# Patient Record
Sex: Female | Born: 1950 | Race: Black or African American | Hispanic: No | Smoking: Never smoker
Health system: Southern US, Community
[De-identification: ages and names within clinical notes are randomized; demographics above are authoritative.]

## PROBLEM LIST (undated history)

## (undated) DIAGNOSIS — D649 Anemia, unspecified: Secondary | ICD-10-CM

## (undated) DIAGNOSIS — N182 Chronic kidney disease, stage 2 (mild): Secondary | ICD-10-CM

## (undated) DIAGNOSIS — R51 Headache: Secondary | ICD-10-CM

## (undated) DIAGNOSIS — I10 Essential (primary) hypertension: Secondary | ICD-10-CM

## (undated) DIAGNOSIS — G8929 Other chronic pain: Secondary | ICD-10-CM

## (undated) DIAGNOSIS — M8580 Other specified disorders of bone density and structure, unspecified site: Secondary | ICD-10-CM

## (undated) DIAGNOSIS — K219 Gastro-esophageal reflux disease without esophagitis: Secondary | ICD-10-CM

## (undated) DIAGNOSIS — F419 Anxiety disorder, unspecified: Secondary | ICD-10-CM

## (undated) DIAGNOSIS — F32A Depression, unspecified: Secondary | ICD-10-CM

## (undated) DIAGNOSIS — M549 Dorsalgia, unspecified: Secondary | ICD-10-CM

## (undated) DIAGNOSIS — K5792 Diverticulitis of intestine, part unspecified, without perforation or abscess without bleeding: Secondary | ICD-10-CM

## (undated) DIAGNOSIS — I341 Nonrheumatic mitral (valve) prolapse: Secondary | ICD-10-CM

## (undated) DIAGNOSIS — N289 Disorder of kidney and ureter, unspecified: Secondary | ICD-10-CM

## (undated) DIAGNOSIS — E7800 Pure hypercholesterolemia, unspecified: Secondary | ICD-10-CM

## (undated) DIAGNOSIS — I4891 Unspecified atrial fibrillation: Secondary | ICD-10-CM

## (undated) DIAGNOSIS — M797 Fibromyalgia: Secondary | ICD-10-CM

## (undated) DIAGNOSIS — F329 Major depressive disorder, single episode, unspecified: Secondary | ICD-10-CM

## (undated) DIAGNOSIS — J45909 Unspecified asthma, uncomplicated: Secondary | ICD-10-CM

## (undated) DIAGNOSIS — M1990 Unspecified osteoarthritis, unspecified site: Secondary | ICD-10-CM

## (undated) DIAGNOSIS — J189 Pneumonia, unspecified organism: Secondary | ICD-10-CM

## (undated) DIAGNOSIS — E213 Hyperparathyroidism, unspecified: Secondary | ICD-10-CM

## (undated) DIAGNOSIS — Z8719 Personal history of other diseases of the digestive system: Secondary | ICD-10-CM

## (undated) DIAGNOSIS — R519 Headache, unspecified: Secondary | ICD-10-CM

## (undated) HISTORY — PX: CHOLECYSTECTOMY: SHX55

## (undated) HISTORY — PX: TRANSURETHRAL RESECTION OF BLADDER TUMOR WITH GYRUS (TURBT-GYRUS): SHX6458

## (undated) HISTORY — PX: TONSILLECTOMY AND ADENOIDECTOMY: SUR1326

## (undated) HISTORY — PX: HERNIA REPAIR: SHX51

## (undated) HISTORY — PX: CARDIAC CATHETERIZATION: SHX172

---

## 1970-11-17 HISTORY — PX: TUBAL LIGATION: SHX77

## 1979-07-19 HISTORY — PX: VAGINAL HYSTERECTOMY: SUR661

## 1979-07-19 HISTORY — PX: NISSEN FUNDOPLICATION: SHX2091

## 1989-07-18 HISTORY — PX: LIVER BIOPSY: SHX301

## 1989-07-18 HISTORY — PX: KNEE ARTHROSCOPY: SHX127

## 1999-10-27 ENCOUNTER — Emergency Department (HOSPITAL_COMMUNITY): Admission: EM | Admit: 1999-10-27 | Discharge: 1999-10-27 | Payer: Self-pay | Admitting: Emergency Medicine

## 1999-12-22 ENCOUNTER — Emergency Department (HOSPITAL_COMMUNITY): Admission: EM | Admit: 1999-12-22 | Discharge: 1999-12-22 | Payer: Self-pay | Admitting: Emergency Medicine

## 1999-12-22 ENCOUNTER — Encounter: Payer: Self-pay | Admitting: Internal Medicine

## 2000-09-20 ENCOUNTER — Encounter: Payer: Self-pay | Admitting: Emergency Medicine

## 2000-09-20 ENCOUNTER — Emergency Department (HOSPITAL_COMMUNITY): Admission: EM | Admit: 2000-09-20 | Discharge: 2000-09-20 | Payer: Self-pay | Admitting: Emergency Medicine

## 2001-08-23 ENCOUNTER — Encounter: Payer: Self-pay | Admitting: Emergency Medicine

## 2001-08-23 ENCOUNTER — Emergency Department (HOSPITAL_COMMUNITY): Admission: EM | Admit: 2001-08-23 | Discharge: 2001-08-23 | Payer: Self-pay | Admitting: *Deleted

## 2001-10-01 ENCOUNTER — Encounter: Payer: Self-pay | Admitting: Gastroenterology

## 2001-10-01 ENCOUNTER — Encounter: Admission: RE | Admit: 2001-10-01 | Discharge: 2001-10-01 | Payer: Self-pay | Admitting: Gastroenterology

## 2002-02-22 ENCOUNTER — Ambulatory Visit (HOSPITAL_COMMUNITY): Admission: RE | Admit: 2002-02-22 | Discharge: 2002-02-22 | Payer: Self-pay | Admitting: Gastroenterology

## 2002-02-22 ENCOUNTER — Encounter: Payer: Self-pay | Admitting: Gastroenterology

## 2002-03-02 ENCOUNTER — Encounter (INDEPENDENT_AMBULATORY_CARE_PROVIDER_SITE_OTHER): Payer: Self-pay | Admitting: Specialist

## 2002-03-02 ENCOUNTER — Ambulatory Visit (HOSPITAL_COMMUNITY): Admission: RE | Admit: 2002-03-02 | Discharge: 2002-03-02 | Payer: Self-pay | Admitting: Gastroenterology

## 2002-03-14 ENCOUNTER — Encounter: Payer: Self-pay | Admitting: General Surgery

## 2002-03-14 ENCOUNTER — Encounter (INDEPENDENT_AMBULATORY_CARE_PROVIDER_SITE_OTHER): Payer: Self-pay | Admitting: Specialist

## 2002-03-14 ENCOUNTER — Ambulatory Visit (HOSPITAL_COMMUNITY): Admission: RE | Admit: 2002-03-14 | Discharge: 2002-03-15 | Payer: Self-pay | Admitting: General Surgery

## 2002-03-22 ENCOUNTER — Ambulatory Visit (HOSPITAL_COMMUNITY): Admission: RE | Admit: 2002-03-22 | Discharge: 2002-03-22 | Payer: Self-pay | Admitting: General Surgery

## 2002-03-22 ENCOUNTER — Encounter: Payer: Self-pay | Admitting: General Surgery

## 2002-10-20 ENCOUNTER — Encounter: Admission: RE | Admit: 2002-10-20 | Discharge: 2002-10-20 | Payer: Self-pay | Admitting: Internal Medicine

## 2002-10-20 ENCOUNTER — Encounter: Payer: Self-pay | Admitting: Internal Medicine

## 2002-10-25 ENCOUNTER — Inpatient Hospital Stay (HOSPITAL_COMMUNITY): Admission: RE | Admit: 2002-10-25 | Discharge: 2002-10-26 | Payer: Self-pay | Admitting: Internal Medicine

## 2002-10-25 ENCOUNTER — Encounter: Payer: Self-pay | Admitting: Internal Medicine

## 2002-11-07 ENCOUNTER — Encounter: Admission: RE | Admit: 2002-11-07 | Discharge: 2002-11-07 | Payer: Self-pay | Admitting: Internal Medicine

## 2002-11-07 ENCOUNTER — Encounter: Payer: Self-pay | Admitting: Internal Medicine

## 2003-11-24 ENCOUNTER — Encounter: Admission: RE | Admit: 2003-11-24 | Discharge: 2003-11-24 | Payer: Self-pay | Admitting: Internal Medicine

## 2006-03-31 ENCOUNTER — Emergency Department (HOSPITAL_COMMUNITY): Admission: EM | Admit: 2006-03-31 | Discharge: 2006-04-01 | Payer: Self-pay | Admitting: Emergency Medicine

## 2008-08-07 ENCOUNTER — Emergency Department (HOSPITAL_BASED_OUTPATIENT_CLINIC_OR_DEPARTMENT_OTHER): Admission: EM | Admit: 2008-08-07 | Discharge: 2008-08-07 | Payer: Self-pay | Admitting: Emergency Medicine

## 2009-12-28 ENCOUNTER — Ambulatory Visit (HOSPITAL_COMMUNITY): Admission: RE | Admit: 2009-12-28 | Discharge: 2009-12-28 | Payer: Self-pay | Admitting: Cardiovascular Disease

## 2010-03-08 ENCOUNTER — Ambulatory Visit: Payer: Self-pay | Admitting: Diagnostic Radiology

## 2010-03-08 ENCOUNTER — Emergency Department (HOSPITAL_BASED_OUTPATIENT_CLINIC_OR_DEPARTMENT_OTHER): Admission: EM | Admit: 2010-03-08 | Discharge: 2010-03-09 | Payer: Self-pay | Admitting: Emergency Medicine

## 2010-06-04 ENCOUNTER — Encounter: Admission: RE | Admit: 2010-06-04 | Discharge: 2010-06-04 | Payer: Self-pay | Admitting: Unknown Physician Specialty

## 2011-02-04 LAB — DIFFERENTIAL
Basophils Absolute: 0 10*3/uL (ref 0.0–0.1)
Basophils Relative: 1 % (ref 0–1)
Eosinophils Absolute: 0.1 10*3/uL (ref 0.0–0.7)
Eosinophils Relative: 2 % (ref 0–5)
Lymphocytes Relative: 52 % — ABNORMAL HIGH (ref 12–46)
Lymphs Abs: 2.6 10*3/uL (ref 0.7–4.0)
Monocytes Absolute: 0.3 10*3/uL (ref 0.1–1.0)
Neutro Abs: 1.8 10*3/uL (ref 1.7–7.7)

## 2011-02-04 LAB — URINALYSIS, ROUTINE W REFLEX MICROSCOPIC
Ketones, ur: NEGATIVE mg/dL
Specific Gravity, Urine: 1.013 (ref 1.005–1.030)
pH: 6.5 (ref 5.0–8.0)

## 2011-02-04 LAB — BASIC METABOLIC PANEL
BUN: 11 mg/dL (ref 6–23)
CO2: 32 mEq/L (ref 19–32)
Calcium: 10 mg/dL (ref 8.4–10.5)
Chloride: 99 mEq/L (ref 96–112)
Creatinine, Ser: 0.8 mg/dL (ref 0.4–1.2)
GFR calc non Af Amer: >60 mL/min (ref >60)
Glucose, Bld: 94 mg/dL (ref 70–99)
Potassium: 2.8 mEq/L — ABNORMAL LOW (ref 3.5–5.1)

## 2011-02-04 LAB — CBC
MCHC: 32.7 g/dL (ref 30.0–36.0)
MCV: 86.7 fL (ref 78.0–100.0)
Platelets: 306 10*3/uL (ref 150–400)
RBC: 4.13 MIL/uL (ref 3.87–5.11)
RDW: 14.3 % (ref 11.5–15.5)
WBC: 4.8 10*3/uL (ref 4.0–10.5)

## 2011-02-04 LAB — POCT CARDIAC MARKERS: CKMB, poc: <1 ng/mL — ABNORMAL LOW (ref 1.0–8.0)

## 2011-02-07 LAB — CBC
HCT: 31.5 % — ABNORMAL LOW (ref 36.0–46.0)
Hemoglobin: 10.9 g/dL — ABNORMAL LOW (ref 12.0–15.0)
MCHC: 34.6 g/dL (ref 30.0–36.0)
MCV: 86.3 fL (ref 78.0–100.0)
Platelets: 273 10*3/uL (ref 150–400)
RBC: 3.65 MIL/uL — ABNORMAL LOW (ref 3.87–5.11)
RDW: 14.9 % (ref 11.5–15.5)
WBC: 4.1 10*3/uL (ref 4.0–10.5)

## 2011-02-07 LAB — PROTIME-INR
INR: 1 (ref 0.00–1.49)
Prothrombin Time: 13.1 seconds (ref 11.6–15.2)

## 2011-02-07 LAB — BASIC METABOLIC PANEL
CO2: 29 mEq/L (ref 19–32)
Calcium: 9.7 mg/dL (ref 8.4–10.5)
GFR calc Af Amer: >60 mL/min (ref >60)
GFR calc non Af Amer: >60 mL/min (ref >60)
Potassium: 3.9 mEq/L (ref 3.5–5.1)
Sodium: 137 mEq/L (ref 135–145)

## 2011-02-07 LAB — URINALYSIS, ROUTINE W REFLEX MICROSCOPIC
Glucose, UA: NEGATIVE mg/dL
Nitrite: NEGATIVE
pH: 8 (ref 5.0–8.0)

## 2011-02-07 LAB — APTT: aPTT: 31 seconds (ref 24–37)

## 2011-04-04 NOTE — Procedures (Signed)
[place]. [facility]  Patient:    Christina Vang, Christina Vang Visit Number: [id number] MRN: [id number]          Service Type: END Location: ENDO Attending Physician:  Charna Elizabeth Dictated by:   Anselmo Rod, M.D. Proc. Date: 03/02/02 Admit Date:  03/02/2002   CC:         Cala Bradford R. Renae Gloss, M.D.  Adolph Pollack, M.D.   Procedure Report  DATE OF BIRTH:  1950/11/29.  PROCEDURE:  Colonoscopy.  ENDOSCOPIST:  Anselmo Rod, M.D.  INSTRUMENT USED:  Olympus video colonoscope (adjustable pediatric scope).  INDICATION FOR PROCEDURE:  Guaiac-positive stools and severe constipation in a 60 year old African-American female.  Rule out colonic polyps, masses, hemorrhoids, etc.  PREPROCEDURE PREPARATION:  Informed consent was procured from the patient. The patient was fasted for eight hours prior to the procedure and prepped with a bottle of magnesium citrate and a gallon of NuLytely the night prior to the procedure.  PREPROCEDURE PHYSICAL:  VITAL SIGNS:  The patient had stable vital signs.  NECK:  Supple.  CHEST:  Clear to auscultation.  S1, S2 regular.  ABDOMEN:  Soft with normal bowel sounds.  DESCRIPTION OF PROCEDURE:  The patient was placed in the left lateral decubitus position and sedated with an additional 20 mg of Demerol and 2 mg of Versed intravenously for the colonoscopy.  Once the patient was adequately sedate and maintained on low-flow oxygen and continuous cardiac monitoring, the Olympus video colonoscope was advanced from the rectum to the cecum with extreme difficulty.  There was a large amount of residual stool in the colon. Multiple washes were done but because of solid debris, visualization was not adequate.  The patients position was changed from the left lateral to the supine position to adequately visualize the cecal base.  The appendiceal orifice and the ileocecal valve were identified but because of the stool, small  lesions could have been missed.  There were a few scattered diverticula throughout the colon.  No masses or polyps were seen.  IMPRESSION: 1. Scattered diverticular disease. 2. Large amount of residual stool in the colon, small lesions could have been    missed. 3. No large masses or polyps present. 4. No evidence of hemorrhoidal disease.  RECOMMENDATIONS: 1. Repeat guaiac testing will be done on an outpatient basis. 2. Patient will be tried on Zelnorm 6 mg b.i.d. along with a high-fiber diet    and further recommendations made in follow-up. Dictated by:   Anselmo Rod, M.D. Attending Physician:  Charna Elizabeth DD:  03/02/02 TD:  03/02/02 Job: [id number]

## 2011-04-04 NOTE — Op Note (Signed)
[place]. [facility]  Patient:    Christina Vang, Christina Vang Visit Number: [id number] MRN: [id number]          Service Type: DSU Location: [phone] Attending Physician:  Arlis Porta Dictated by:   Adolph Pollack, M.D. Proc. Date: 03/14/02 Admit Date:  03/14/2002   CC:         Anselmo Rod, M.D.   Operative Report  PREOPERATIVE DIAGNOSIS: 1. Biliary dyskinesia. 2. Abnormal liver function tests.  POSTOPERATIVE DIAGNOSIS: 1. Biliary dyskinesia. 2. Abnormal liver function tests.  OPERATION PERFORMED: 1. Laparoscopic cholecystectomy with intraoperative cholangiogram. 2. Tru-Cut needle liver biopsy.  SURGEON:  Adolph Pollack, M.D.  ASSISTANT:  Lorne Skeens. Hoxworth, M.D.  ANESTHESIA:  General.  INDICATIONS FOR PROCEDURE:  The patient is a 60 year old female who has been having a gnawing type of epigastric pain radiating to the back associated with nausea.  She has known reflux but this is different.  She has a normal gallbladder ultrasound but has an abnormal hepatobiliary scan with ejection fraction that is depressed consistent with biliary dyskinesia.  She also had abnormal liver function tests, etiology unknown.  She presents now for elective operation.  The procedure and the risks were discussed with her. With respect to the liver biopsy, her gastroenterologist called me after the office visit and requested this be done for the abnormal liver function tests and I explained this to Christina Vang in the holding area.  She was agreeable to it.  DESCRIPTION OF PROCEDURE:  She was placed supine on the operating table and a general anesthetic was administered.  The abdomen was sterilely prepped and draped.  She had had a previous upper midline incision for open Nissen fundoplication.  I injected local anesthetic in the subumbilical region and made a subumbilical incision incising the skin and subcutaneous tissue sharply.  I then  identified the midline fascia and made a small incision in the midline fascia.  A pursestring suture of 0 Vicryl was placed around the fascial edges.  The peritoneal cavity was then entered bluntly and under direct vision.  A Hasson trocar was introduced into the peritoneal cavity and a pneumoperitoneum was created by insufflation of CO2 gas.  Next, a laparoscope was introduced.  The right upper quadrant area of the abdominal cavity was relatively free from adhesions.  I was able to put an 11 mm trocar through a similar sized incision in the epigastrium and two 5 mm trocars in the right upper quadrant.  The fundus of the gallbladder was grasped and filmy adhesions between the body of the gallbladder, duodenum and omentum were taken down bluntly and sharply.  The infundibulum was then completely mobilized.  The cystic duct was isolated and a window created around it.  The cystic artery was identified and a window created around it. I then placed a clip at the cystic duct and gallbladder junction.  A small incision incision was made just proximal to this clip and I passed a cholangiocatheter through the anterior abdominal wall and attempted to cannulate the cystic duct but was unsuccessful.  I made an incision more proximal to this and was able to cannulate the cystic duct with a cholangiocatheter to perform a cholangiogram.  Under real time fluoroscopy contrast material was injected through the catheter into the cystic duct.  The common bile duct was opacified quickly with quick passage of contrast material into the duodenum and no obvious evidence to my eye of obstruction.  I also saw  the common hepatic duct and right and left hepatic ducts under real time fluoroscopy.  Still photos were taken and sent to the radiologist for official interpretation which is pending at this time.  Next, I removed the cholangiocatheter, I clipped the cystic duct three times proximally and divided it. The  cystic artery was clipped and divided.  I dissected the gallbladder free from the liver bed with the cautery.  A small puncture wound was made in the gallbladder with some spillage of bile of a moderate amount.  I removed the gallbladder and placed it into an endopouch bag.  I then copiously irrigated out the gallbladder fossa and bleeding points were controlled with the cautery.  Next, I passed a Tru-Cut needle through the anterior abdominal wall into the liver and a Tru-Cut needle biopsy was performed obtaining an adequate core of tissue.  This was sent to pathology. The biopsy site bleeding was controlled with the cautery.  I inspected the gallbladder fossa once again and saw no bile leakage or bleeding and then inspected the liver biopsy site and saw no bleeding.  I then copiously irrigated out the perihepatic area and the fluid rapidly turned clear.  Under direct vision I removed the gallbladder in the Endopouch bag through the subumbilical port and closed the subumbilical fascia defect by tightening up and tying down the pursestring suture.  The rest of the trocars were removed and the pneumoperitoneum was released.  Skin incisions were closed with 4-0 Monocryl subcuticular stitches. Steri-Strips and sterile dressings were applied.  The patient tolerated the procedure well without any apparent complications and was taken to the recovery room in satisfactory condition.  She does have a history of some mitral valve prolapse.  However, I felt that a risk of anything given the type of operation was small and we just were covering her with the perioperative Ancef.Dictated by:   Adolph Pollack, M.D. Attending Physician:  Arlis Porta DD:  03/14/02 TD:  03/14/02 Job: [id number]

## 2011-04-04 NOTE — H&P (Signed)
NAME:  Christina Vang, Christina Vang                      ACCOUNT NO.:  [id number]   MEDICAL RECORD NO.:  [id number]                   PATIENT TYPE:  INP   LOCATION:  2923                                 FACILITY:  [facility]   PHYSICIAN:  Ralene Ok, M.D.                   DATE OF BIRTH:  [birth year]   DATE OF ADMISSION:  10/25/2002  DATE OF DISCHARGE:                                HISTORY & PHYSICAL   PRESENTING COMPLAINT:  Chest pain.   HISTORY OF PRESENT ILLNESS:  This 60 year old African-American lady  presented to the office with a history of two hours duration chest pain. The  chest pain was sudden onset started at rest and did radiate up to her neck  and was basically retrosternal, but also radiated across the upper abdomen.  The patient also admitted to pain increase on deep inspiration.   The patient recently was treated for an acute bronchitis and also had some  chest pains in the recent past. The patient says this had been going on for  the past two months. A recent stress test done at [facility]  called a stress dual-isotope SPECT study was reported to show normal  perfusion and redistribution images.  The patient had been ambulating and  exercising and admits to taking her medication on a regular basis. She had  been a little more depressed and under stress recently.   PAST MEDICAL HISTORY:  The patient also has a past history of hypertension,  hyperlipidemia, fibromyalgia, hiatal hernia, status post fundoplication,  history of diverticulosis, known fatty liver with proven liver biopsy,  status post cholecystectomy, status post hysterectomy. 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 She is up to date on her Pneumovax and flu vaccine. The patient  has had a colonoscopy in the past which was normal.   SOCIAL HISTORY:  The patient is a nonsmoker. She does not drink any alcohol.   ALLERGIES:   TETRACYCLINE.   REVIEW OF SYSTEMS:  The patient says her cough is improved from a recent  bronchitis. There is no shortness of breath today. Her bowels and  micturition were normal. The patient did not have any dizzy spells and no  headaches. Her myalgia pains have improved.   MEDICATIONS:  1. Prevacid 30 mg q.d.  2. Lexapro 10 mg q.d.  3. Flagyl 20 mg b.i.d.  4. Trazodone 50 mg 2 q.h.s.  5. Hydrochlorothiazide 12.5 mg q.d.  6. Benicar 20/12.5 mg q.d.  7. Flexeril 10 mg b.i.d.  8. Zetia 10 mg q.d.  9. Toprol  XL 200 mg q.d.  10.      Multivitamin plus vitamin C and magnesium plus E 1 q.d.  11.      Tylenol p.r.n.   PHYSICAL EXAMINATION:  GENERAL:  The patient was  anxious and appeared to be  in slight  distress.  VITAL SIGNS:  Heart rate 70 per minute, blood pressure was elevated at  160/100.  HEENT:  Appeared normal. Conjunctivae were normal. There was no icterus.  NECK:  Supple, JVP was normal. There were no bruits.  CHEST:  Clinically clear to auscultation. There were no murmurs noted.  ABDOMEN:  Showed tenderness in the upper abdomen which extended to the  medial chest wall. Liver and spleen were not palpable. Bowel sounds were  within normal limits.  NEUROLOGIC:  There was no localizing neurological deficit, and the patient's  gait was normal.   LABORATORY DATA:  An EKG done in the office showed no evidence of acute ST  changes.   PROBLEMS:  1. Atypical chest pain. Her risk factors were coronary artery disease and     hypertension and hyperlipidemia. In spite of recent negative stress test     two months ago, the patient will be admitted to rule out acute cardiac     event.  2. Chest wall tenderness and abdominal tenderness. Suggest a GI cause and     could be related to the patient's previous hiatal hernia.  3. Anxiety. May be another factor contributing to  the patient's problem.  4. Hypertension with elevated blood pressures. Will monitor this in the      hospital.  5. Bronchitis. Appears to have resolved.  6. Known fibromyalgia. Will continue the patient's current medication in     Lexapro and Trazodone.  7. Fatty liver with previous abnormal liver function tests.  8. Arthritis of the knees.   PLAN:  The patient will be admitted to the CCU for cardiac monitoring. Will  repeat an EKG on arrival. Will check CK-MB and troponin x 3. Place the  patient on nitro paste, continue her on beta blockers and ARBs and add  aspirin once daily and review with the patient once these results are back  to see if the patient needs further cardiac evaluation.                                                   Ralene Ok, M.D.    RM/MEDQ  D:  10/25/2002  T:  10/25/2002  Job:  [id number]

## 2011-04-04 NOTE — Procedures (Signed)
[place]. [facility]  Patient:    Christina Vang, Christina Vang Visit Number: [id number] MRN: [id number]          Service Type: END Location: ENDO Attending Physician:  Charna Elizabeth Dictated by:   Anselmo Rod, M.D. Proc. Date: 03/02/02 Admit Date:  03/02/2002   CC:         Cala Bradford R. Renae Gloss, M.D.  Adolph Pollack, M.D.   Procedure Report  DATE OF BIRTH:  1951/10/03.  PROCEDURE:  Esophagogastroduodenoscopy with biopsies.  ENDOSCOPIST:  Anselmo Rod, M.D.  INSTRUMENT USED:  Olympus video panendoscope.  INDICATION FOR PROCEDURE:  A 60 year old African-American female with a history of epigastric pain, nausea, and guaiac-positive stools.  The patient has a low gallbladder EF on HIDA scan of 22%.  Rule out peptic ulcer disease, esophagitis, gastritis, etc.  The patient has had severe retrosternal burning and reflux in spite of PPI use.  PREPROCEDURE PREPARATION:  Informed consent was procured from the patient. The patient was fasted for eight hours prior to the procedure and given 400 mg of Cipro IV because of a history of mitral valve prolapse for prophylactic purposes prior to the procedure.  PREPROCEDURE PHYSICAL:  VITAL SIGNS:  The patient had stable vital signs.  NECK:  Supple.  CHEST:  Clear to auscultation.  S1, S2 regular.  ABDOMEN:  Soft with normal bowel sounds.  DESCRIPTION OF PROCEDURE:  The patient was placed in the left lateral decubitus position and sedated with 60 mg of Demerol and 6 mg of Versed intravenously.  Once the patient was adequately sedate and maintained on low-flow oxygen and continuous cardiac monitoring, the Olympus video panendoscope was advanced through the mouthpiece, over the tongue, into the esophagus under direct vision.  The proximal esophagus appeared normal.  There were Barretts-like changes in the distal esophagus at the Z-line.  Biopsies were done to rule out dysplasia and to confirm a diagnosis  of Barretts esophagus.  On advancing the scope into the stomach, there was a small hiatal hernia seen on high retroflexion.  There was diffuse gastritis throughout the gastric mucosa.  Biopsies were done to rule out presence of Helicobacter pylori by pathology.  No ulcers, erosions, masses, or polyps were seen, and the proximal small bowel appeared normal and healthy, including the duodenal bulb.  IMPRESSION: 1. Normal-appearing proximal esophagus. 2. Barretts-like changes of distal esophagus at the Z-line, biopsies done to    rule out Barretts. 3. Severe diffuse gastritis with no evidence of frank ulcers or erosions,    biopsies done to rule out Helicobacter pylori by pathology. 4. Small hiatal hernia on retroflexion. 5. Normal proximal small bowel.  RECOMMENDATIONS: 1. Continue PPIs for now. 2. Treat with antibiotics if H. pylori present on biopsies. 3. Antireflux measures. 4. Avoid all nonsteroidals including aspirin. 5. Proceed with colonoscopy at this time. Dictated by:   Anselmo Rod, M.D. Attending Physician:  Charna Elizabeth DD:  03/02/02 TD:  03/02/02 Job: [id number]

## 2011-11-11 ENCOUNTER — Encounter: Payer: Self-pay | Admitting: *Deleted

## 2011-11-11 ENCOUNTER — Other Ambulatory Visit: Payer: Self-pay

## 2011-11-11 ENCOUNTER — Emergency Department (INDEPENDENT_AMBULATORY_CARE_PROVIDER_SITE_OTHER): Payer: BC Managed Care – PPO

## 2011-11-11 ENCOUNTER — Emergency Department (HOSPITAL_BASED_OUTPATIENT_CLINIC_OR_DEPARTMENT_OTHER)
Admission: EM | Admit: 2011-11-11 | Discharge: 2011-11-12 | Disposition: A | Discharge status: Home / Self Care | Payer: BC Managed Care – PPO | Attending: Emergency Medicine | Admitting: Emergency Medicine

## 2011-11-11 DIAGNOSIS — K219 Gastro-esophageal reflux disease without esophagitis: Secondary | ICD-10-CM | POA: Insufficient documentation

## 2011-11-11 DIAGNOSIS — R079 Chest pain, unspecified: Secondary | ICD-10-CM

## 2011-11-11 DIAGNOSIS — R0789 Other chest pain: Secondary | ICD-10-CM | POA: Insufficient documentation

## 2011-11-11 DIAGNOSIS — E876 Hypokalemia: Secondary | ICD-10-CM | POA: Insufficient documentation

## 2011-11-11 DIAGNOSIS — M549 Dorsalgia, unspecified: Secondary | ICD-10-CM

## 2011-11-11 DIAGNOSIS — Z79899 Other long term (current) drug therapy: Secondary | ICD-10-CM | POA: Insufficient documentation

## 2011-11-11 DIAGNOSIS — R42 Dizziness and giddiness: Secondary | ICD-10-CM

## 2011-11-11 DIAGNOSIS — N390 Urinary tract infection, site not specified: Secondary | ICD-10-CM

## 2011-11-11 DIAGNOSIS — IMO0001 Reserved for inherently not codable concepts without codable children: Secondary | ICD-10-CM | POA: Insufficient documentation

## 2011-11-11 DIAGNOSIS — I10 Essential (primary) hypertension: Secondary | ICD-10-CM | POA: Insufficient documentation

## 2011-11-11 DIAGNOSIS — Z8739 Personal history of other diseases of the musculoskeletal system and connective tissue: Secondary | ICD-10-CM | POA: Insufficient documentation

## 2011-11-11 DIAGNOSIS — R209 Unspecified disturbances of skin sensation: Secondary | ICD-10-CM

## 2011-11-11 HISTORY — DX: Fibromyalgia: M79.7

## 2011-11-11 HISTORY — DX: Nonrheumatic mitral (valve) prolapse: I34.1

## 2011-11-11 HISTORY — DX: Essential (primary) hypertension: I10

## 2011-11-11 HISTORY — DX: Unspecified osteoarthritis, unspecified site: M19.90

## 2011-11-11 LAB — DIFFERENTIAL
Basophils Absolute: 0 10*3/uL (ref 0.0–0.1)
Eosinophils Absolute: 0.1 10*3/uL (ref 0.0–0.7)
Lymphs Abs: 2.1 10*3/uL (ref 0.7–4.0)
Monocytes Relative: 9 % (ref 3–12)
Neutro Abs: 1.9 10*3/uL (ref 1.7–7.7)
Neutrophils Relative %: 42 % — ABNORMAL LOW (ref 43–77)

## 2011-11-11 LAB — URINALYSIS, ROUTINE W REFLEX MICROSCOPIC
Glucose, UA: NEGATIVE mg/dL
Hgb urine dipstick: NEGATIVE
Nitrite: POSITIVE — AB
Protein, ur: 30 mg/dL — AB
Specific Gravity, Urine: 1.02 (ref 1.005–1.030)
pH: 6.5 (ref 5.0–8.0)

## 2011-11-11 LAB — COMPREHENSIVE METABOLIC PANEL
AST: 16 U/L (ref 0–37)
Albumin: 3.9 g/dL (ref 3.5–5.2)
Alkaline Phosphatase: 90 U/L (ref 39–117)
CO2: 27 mEq/L (ref 19–32)
Calcium: 10.3 mg/dL (ref 8.4–10.5)
Chloride: 99 mEq/L (ref 96–112)
Creatinine, Ser: 1.2 mg/dL — ABNORMAL HIGH (ref 0.50–1.10)
Sodium: 137 mEq/L (ref 135–145)
Total Protein: 7.2 g/dL (ref 6.0–8.3)

## 2011-11-11 LAB — URINE MICROSCOPIC-ADD ON

## 2011-11-11 MED ORDER — CEPHALEXIN 250 MG PO CAPS: 500.0000 mg | ORAL_CAPSULE | Freq: Once | ORAL | Status: DC

## 2011-11-11 MED ORDER — ONDANSETRON 8 MG PO TBDP: 8.0000 mg | ORAL_TABLET | Freq: Once | ORAL | Status: DC

## 2011-11-11 MED ORDER — ONDANSETRON HCL 4 MG/2ML IJ SOLN
INTRAMUSCULAR | Status: AC
  Filled 2011-11-11: qty 2

## 2011-11-11 MED ORDER — GI COCKTAIL ~~LOC~~
30.0000 mL | Freq: Once | ORAL | Status: AC
  Administered 2011-11-12: 30 mL via ORAL
  Filled 2011-11-11: qty 30

## 2011-11-11 MED ORDER — MORPHINE SULFATE 4 MG/ML IJ SOLN
INTRAMUSCULAR | Status: AC
  Administered 2011-11-11: 4 mg via INTRAVENOUS
  Filled 2011-11-11: qty 1

## 2011-11-11 MED ORDER — MORPHINE SULFATE 4 MG/ML IJ SOLN
4.0000 mg | Freq: Once | INTRAMUSCULAR | Status: AC
  Administered 2011-11-11: 4 mg via INTRAVENOUS

## 2011-11-11 MED ORDER — POTASSIUM CHLORIDE CRYS ER 20 MEQ PO TBCR
40.0000 meq | EXTENDED_RELEASE_TABLET | Freq: Once | ORAL | Status: AC
  Administered 2011-11-11: 40 meq via ORAL
  Filled 2011-11-11: qty 2

## 2011-11-11 MED ORDER — TRAMADOL HCL 50 MG PO TABS: 50.0000 mg | ORAL_TABLET | Freq: Once | ORAL | Status: DC

## 2011-11-11 MED ORDER — ASPIRIN 325 MG PO TABS
325.0000 mg | ORAL_TABLET | Freq: Once | ORAL | Status: AC
  Administered 2011-11-11: 325 mg via ORAL
  Filled 2011-11-11: qty 1

## 2011-11-11 MED ORDER — ONDANSETRON HCL 4 MG/2ML IJ SOLN: 4.0000 mg | Freq: Once | INTRAMUSCULAR | Status: DC

## 2011-11-11 MED ORDER — ONDANSETRON HCL 4 MG/2ML IJ SOLN
4.0000 mg | Freq: Once | INTRAMUSCULAR | Status: AC
  Administered 2011-11-11: 4 mg via INTRAVENOUS

## 2011-11-11 NOTE — ED Notes (Signed)
States she has "been feeling bad for a couple months"- tonight has pain and tingling in left chest x 5 hours

## 2011-11-11 NOTE — ED Provider Notes (Addendum)
History     CSN: [id number]  Arrival date & time 11/11/11  2200   First MD Initiated Contact with Patient 11/11/11 2220      Chief Complaint  Patient presents with  . Chest Pain    (Consider location/radiation/quality/duration/timing/severity/associated sxs/prior treatment) HPI Patient is a 60 year old female with history of multiple medical problems including hypertension and fibromyalgia who presents today complaining of chest pain that began 5 hours ago. She says this was a 10 out of 10 but is now down to 6/10 with treatment with tramadol at home. Patient has had a cardiac cath in the past year with no evidence of coronary artery disease. This was performed by Dr. Gery Pray. Patient has had constant chest pain over 5 hours. She says this is not burning like her indigestion. She did not have associated shortness of breath. She also states that she just generally "feels bad". This is single and on for the past couple of weeks. She had evaluation including an ultrasound of the abdomen 2 weeks ago which was negative, laboratory work urinalysis from her doctor on Saturday which she has not heard the results from, as well as a recent CT that was unremarkable. Patient says that she does sometimes get low potassium levels and she feels like that may be the case today. She denies any nausea, vomiting, urinary symptoms, or other concerns. There is nothing that has made her pain worse. There are no other associated or modifying factors. Past Medical History  Diagnosis Date  . Arthritis   . Fibromyalgia   . Mitral valve prolapse   . Hypertension     Past Surgical History  Procedure Date  . Abdominal hysterectomy   . Nissan fundiplication   . Knee surgery   . Cholecystectomy     History reviewed. No pertinent family history.  History  Substance Use Topics  . Smoking status: Never Smoker   . Smokeless tobacco: Never Used  . Alcohol Use: No    OB History    Grav Para Term Preterm  Abortions TAB SAB Ect Mult Living                  Review of Systems  Constitutional: Positive for appetite change and fatigue.  HENT: Negative.   Eyes: Negative.   Respiratory: Negative.   Cardiovascular: Positive for chest pain.  Gastrointestinal: Positive for abdominal pain.  Genitourinary: Negative.   Musculoskeletal: Negative.   Skin: Negative.   Neurological: Negative.   Hematological: Negative.   Psychiatric/Behavioral: Negative.   All other systems reviewed and are negative.    Allergies  Augmentin; Codeine; and Tetracyclines & related  Home Medications   Current Outpatient Rx  Name Route Sig Dispense Refill  . AMLODIPINE BESYLATE 10 MG PO TABS Oral Take 10 mg by mouth daily.      . ASPIRIN EC 81 MG PO TBEC Oral Take 81 mg by mouth daily.      Marland Kitchen CIPROFLOXACIN HCL 500 MG PO TABS Oral Take 500 mg by mouth 2 (two) times daily.      . CYCLOBENZAPRINE HCL 10 MG PO TABS Oral Take 10 mg by mouth 3 (three) times daily.      . DULOXETINE HCL 60 MG PO CPEP Oral Take 120 mg by mouth daily.      Marland Kitchen FEXOFENADINE HCL 180 MG PO TABS Oral Take 180 mg by mouth daily as needed. For allergies     . LISINOPRIL 40 MG PO TABS Oral Take 40 mg by  mouth daily.      Marland Kitchen METRONIDAZOLE 500 MG PO TABS Oral Take 500 mg by mouth 2 (two) times daily.      . ADULT MULTIVITAMIN W/MINERALS CH Oral Take 1 tablet by mouth daily.      Marland Kitchen POTASSIUM CHLORIDE CRYS CR 20 MEQ PO TBCR Oral Take 20 mEq by mouth 2 (two) times daily.      Marland Kitchen PROMETHAZINE HCL 25 MG PO TABS Oral Take 25 mg by mouth every 6 (six) hours as needed. For nausea      . TRAMADOL HCL 50 MG PO TABS Oral Take 50 mg by mouth 3 (three) times daily. Maximum dose= 8 tablets per day     . VITAMIN D (ERGOCALCIFEROL) 50000 UNITS PO CAPS Oral Take 50,000 Units by mouth every 7 (seven) days. Take on Friday     . ZOLPIDEM TARTRATE 10 MG PO TABS Oral Take 10 mg by mouth at bedtime.        BP 139/85  Pulse 76  Temp(Src) 98.7 F (37.1 C) (Oral)  Resp  20  Ht 5\' 7"  (1.702 m)  Wt 167 lb (75.751 kg)  BMI 26.16 kg/m2  SpO2 100%  Physical Exam  Nursing note and vitals reviewed. Constitutional: She is oriented to person, place, and time. She appears well-developed and well-nourished. No distress.  HENT:  Head: Normocephalic and atraumatic.  Eyes: Conjunctivae and EOM are normal. Pupils are equal, round, and reactive to light.  Neck: Normal range of motion. Neck supple.  Cardiovascular: Normal rate, regular rhythm, normal heart sounds and intact distal pulses.  Exam reveals no gallop and no friction rub.   No murmur heard. Pulmonary/Chest: Effort normal and breath sounds normal. No respiratory distress. She has no wheezes. She has no rales.  Abdominal: Soft. Bowel sounds are normal. She exhibits no distension. There is no tenderness. There is no rebound and no guarding.  Musculoskeletal: Normal range of motion. She exhibits no edema and no tenderness.  Neurological: She is alert and oriented to person, place, and time. No cranial nerve deficit. She exhibits normal muscle tone. Coordination normal.  Skin: Skin is warm and dry. No rash noted.  Psychiatric: She has a normal mood and affect.    ED Course  Procedures (including critical care time)  Date: 11/11/2011  Rate: 73  Rhythm: normal sinus rhythm  QRS Axis: normal  Intervals: normal  ST/T Wave abnormalities: nonspecific ST/T changes  Conduction Disutrbances:none  Narrative Interpretation:   Old EKG Reviewed: unchanged  Labs Reviewed  CBC - Abnormal; Notable for the following:    Hemoglobin 10.9 (*)    HCT 31.9 (*)    All other components within normal limits  DIFFERENTIAL - Abnormal; Notable for the following:    Neutrophils Relative 42 (*)    All other components within normal limits  COMPREHENSIVE METABOLIC PANEL - Abnormal; Notable for the following:    Potassium 3.0 (*)    Glucose, Bld 105 (*)    Creatinine, Ser 1.20 (*)    GFR calc non Af Amer 48 (*)    GFR calc Af  Amer 56 (*)    All other components within normal limits  LIPASE, BLOOD - Abnormal; Notable for the following:    Lipase 62 (*)    All other components within normal limits  URINALYSIS, ROUTINE W REFLEX MICROSCOPIC - Abnormal; Notable for the following:    Color, Urine AMBER (*) BIOCHEMICALS MAY BE AFFECTED BY COLOR   Bilirubin Urine SMALL (*)  Ketones, ur 15 (*)    Protein, ur 30 (*)    Nitrite POSITIVE (*)    Leukocytes, UA MODERATE (*)    All other components within normal limits  URINE MICROSCOPIC-ADD ON - Abnormal; Notable for the following:    Bacteria, UA FEW (*)    Casts HYALINE CASTS (*)    Crystals CA OXALATE CRYSTALS (*)    All other components within normal limits  TROPONIN I  URINE CULTURE   Dg Chest 2 View  11/11/2011  *RADIOLOGY REPORT*  Clinical Data: Chest pain, radiating to the mid left back. Tingling down both arms and dizziness.  CHEST - 2 VIEW  Comparison: Chest radiograph performed 03/06/2010  Findings: The lungs are well-aerated and clear.  There is no evidence of focal opacification, pleural effusion or pneumothorax.  The heart is normal in size; the mediastinal contour is within normal limits.  No acute osseous abnormalities are seen.  Scattered clips are noted at the upper abdomen.  IMPRESSION: No acute cardiopulmonary process seen.  Original Report Authenticated By: Tonia Ghent, M.D.     1. Hypokalemia   2. UTI (urinary tract infection)   3. GERD (gastroesophageal reflux disease)   4. Atypical chest pain       MDM  Patient had numerous complaints of evaluation. Most concerning of these given her age was her chest pain. She did have cardiac as well as abdominal pain workup performed. EKG was unremarkable. Patient was given aspirin by mouth. She did complain of some nausea and pain and was given Zofran and morphine for this. Patient had no leukocytosis. She had slight anemia that was not significantly changed compared to prior values. Patient did have  creatinine of 1.2 with last value of 0.8 in April 2011. Patient had urinalysis with evidence of urinary tract infection. Calcium oxalate crystals were noted as well. Patient does not have any flank pain. Patient did have mild hypokalemia which was replaced orally. Patient had negative troponin as well as mild elevation of her lipase though not high enough to indicate pancreatitis. Patient was given Keflex orally for her urinary tract infection.  Patient has recently had a CAT scan on the 18th of this month that was completely negative. Despite presence of calcium oxalate crystals both the patient and myself agreed that CT scan was not necessary this evening. Of note patient is currently being treated with Cipro and Flagyl for possible diverticulitis. She had had treatment with Cipro for 14 days prior to this recent restart of the therapy on Saturday. Patient has a Cipro resistant bug in her urine in my opinion. A urine culture was sent. Patient has pain and nausea medication at home. She'll be discharged prescription for antibiotic. She was told to contact her primary care doctor first thing in the morning. Patient also felt that the sensation she had in her chest as indigestion. She was given a GI cocktail.  Chest x-ray returned normal. As patient felt that her symptoms were totally noncardiac in light of all of the other findings as well as the fact that she has had a completely clean heart catheterization within the past year we both agree that additional laboratory testing for cardiac causes as necessary. Patient understands that if she had return of chest pain she is welcome to return for further evaluation Patient felt better with this. Patient family were comfortable with plan.        Cyndra Numbers, MD 11/12/11 [id number]  Cyndra Numbers, MD 11/12/11 [phone]

## 2011-11-12 MED ORDER — CEPHALEXIN 500 MG PO CAPS: 500.0000 mg | ORAL_CAPSULE | Freq: Four times a day (QID) | ORAL | Status: DC

## 2011-11-13 LAB — URINE CULTURE

## 2011-11-14 ENCOUNTER — Other Ambulatory Visit: Payer: Self-pay

## 2011-11-14 ENCOUNTER — Emergency Department (INDEPENDENT_AMBULATORY_CARE_PROVIDER_SITE_OTHER): Payer: BC Managed Care – PPO

## 2011-11-14 ENCOUNTER — Inpatient Hospital Stay (HOSPITAL_BASED_OUTPATIENT_CLINIC_OR_DEPARTMENT_OTHER)
Admission: EM | Admit: 2011-11-14 | Discharge: 2011-11-19 | DRG: 296 | Disposition: A | Discharge status: Home / Self Care | Payer: BC Managed Care – PPO | Attending: Internal Medicine | Admitting: Internal Medicine

## 2011-11-14 ENCOUNTER — Encounter (HOSPITAL_BASED_OUTPATIENT_CLINIC_OR_DEPARTMENT_OTHER): Payer: Self-pay

## 2011-11-14 DIAGNOSIS — K7689 Other specified diseases of liver: Secondary | ICD-10-CM | POA: Diagnosis present

## 2011-11-14 DIAGNOSIS — R634 Abnormal weight loss: Secondary | ICD-10-CM | POA: Diagnosis present

## 2011-11-14 DIAGNOSIS — E876 Hypokalemia: Secondary | ICD-10-CM

## 2011-11-14 DIAGNOSIS — R110 Nausea: Secondary | ICD-10-CM

## 2011-11-14 DIAGNOSIS — R9431 Abnormal electrocardiogram [ECG] [EKG]: Secondary | ICD-10-CM

## 2011-11-14 DIAGNOSIS — K5730 Diverticulosis of large intestine without perforation or abscess without bleeding: Secondary | ICD-10-CM | POA: Diagnosis present

## 2011-11-14 DIAGNOSIS — E0590 Thyrotoxicosis, unspecified without thyrotoxic crisis or storm: Secondary | ICD-10-CM

## 2011-11-14 DIAGNOSIS — R51 Headache: Secondary | ICD-10-CM

## 2011-11-14 DIAGNOSIS — K760 Fatty (change of) liver, not elsewhere classified: Secondary | ICD-10-CM

## 2011-11-14 DIAGNOSIS — I059 Rheumatic mitral valve disease, unspecified: Secondary | ICD-10-CM | POA: Diagnosis present

## 2011-11-14 DIAGNOSIS — I10 Essential (primary) hypertension: Secondary | ICD-10-CM

## 2011-11-14 DIAGNOSIS — B370 Candidal stomatitis: Secondary | ICD-10-CM | POA: Diagnosis present

## 2011-11-14 DIAGNOSIS — R519 Headache, unspecified: Secondary | ICD-10-CM

## 2011-11-14 DIAGNOSIS — Z7982 Long term (current) use of aspirin: Secondary | ICD-10-CM

## 2011-11-14 DIAGNOSIS — R112 Nausea with vomiting, unspecified: Secondary | ICD-10-CM | POA: Diagnosis present

## 2011-11-14 DIAGNOSIS — R42 Dizziness and giddiness: Secondary | ICD-10-CM

## 2011-11-14 DIAGNOSIS — M129 Arthropathy, unspecified: Secondary | ICD-10-CM | POA: Diagnosis present

## 2011-11-14 DIAGNOSIS — IMO0001 Reserved for inherently not codable concepts without codable children: Secondary | ICD-10-CM | POA: Diagnosis present

## 2011-11-14 DIAGNOSIS — R209 Unspecified disturbances of skin sensation: Secondary | ICD-10-CM | POA: Diagnosis present

## 2011-11-14 LAB — CBC
HCT: 37 % (ref 36.0–46.0)
Hemoglobin: 12.7 g/dL (ref 12.0–15.0)
MCV: 80.6 fL (ref 78.0–100.0)
RBC: 4.59 MIL/uL (ref 3.87–5.11)
RDW: 14.8 % (ref 11.5–15.5)
WBC: 4.4 10*3/uL (ref 4.0–10.5)

## 2011-11-14 LAB — PROTEIN AND GLUCOSE, CSF
Glucose, CSF: 61 mg/dL (ref 43–76)
Total  Protein, CSF: 44 mg/dL (ref 15–45)

## 2011-11-14 LAB — GRAM STAIN

## 2011-11-14 LAB — COMPREHENSIVE METABOLIC PANEL
AST: 48 U/L — ABNORMAL HIGH (ref 0–37)
CO2: 27 mEq/L (ref 19–32)
Chloride: 100 mEq/L (ref 96–112)
Creatinine, Ser: 1.1 mg/dL (ref 0.50–1.10)
GFR calc Af Amer: 62 mL/min — ABNORMAL LOW (ref >90)
GFR calc non Af Amer: 53 mL/min — ABNORMAL LOW (ref >90)
Glucose, Bld: 98 mg/dL (ref 70–99)
Total Bilirubin: 0.5 mg/dL (ref 0.3–1.2)

## 2011-11-14 LAB — DIFFERENTIAL
Basophils Absolute: 0 10*3/uL (ref 0.0–0.1)
Lymphocytes Relative: 37 % (ref 12–46)
Lymphs Abs: 1.6 10*3/uL (ref 0.7–4.0)
Monocytes Absolute: 0.3 10*3/uL (ref 0.1–1.0)
Monocytes Relative: 6 % (ref 3–12)
Neutro Abs: 2.4 10*3/uL (ref 1.7–7.7)

## 2011-11-14 LAB — CSF CELL COUNT WITH DIFFERENTIAL: Tube #: 4

## 2011-11-14 MED ORDER — POTASSIUM CHLORIDE CRYS ER 20 MEQ PO TBCR
40.0000 meq | EXTENDED_RELEASE_TABLET | Freq: Once | ORAL | Status: AC
  Administered 2011-11-14: 40 meq via ORAL
  Filled 2011-11-14: qty 2

## 2011-11-14 MED ORDER — ONDANSETRON HCL 4 MG/2ML IJ SOLN
4.0000 mg | Freq: Once | INTRAMUSCULAR | Status: AC
  Administered 2011-11-14: 4 mg via INTRAVENOUS
  Filled 2011-11-14: qty 2

## 2011-11-14 MED ORDER — METHYLPREDNISOLONE SODIUM SUCC 125 MG IJ SOLR
125.0000 mg | Freq: Once | INTRAMUSCULAR | Status: AC
  Administered 2011-11-14: 125 mg via INTRAVENOUS
  Filled 2011-11-14: qty 2

## 2011-11-14 MED ORDER — LIDOCAINE-EPINEPHRINE 2 %-1:100000 IJ SOLN
1.7000 mL | Freq: Once | INTRAMUSCULAR | Status: AC
  Administered 2011-11-14: 1.7 mL
  Filled 2011-11-14: qty 1

## 2011-11-14 MED ORDER — SODIUM CHLORIDE 0.9 % IV BOLUS (SEPSIS)
500.0000 mL | Freq: Once | INTRAVENOUS | Status: AC
  Administered 2011-11-14: 500 mL via INTRAVENOUS

## 2011-11-14 MED ORDER — DIPHENHYDRAMINE HCL 50 MG/ML IJ SOLN
25.0000 mg | Freq: Once | INTRAMUSCULAR | Status: AC
  Administered 2011-11-14: 25 mg via INTRAVENOUS
  Filled 2011-11-14: qty 1

## 2011-11-14 NOTE — ED Notes (Signed)
Pt ambulated to restroom unassisted with EDP approval.

## 2011-11-14 NOTE — ED Provider Notes (Signed)
History     CSN: [id number]  Arrival date & time 11/14/11  1650   First MD Initiated Contact with Patient 11/14/11 1714      Chief Complaint  Patient presents with  . Headache  . Nausea    (Consider location/radiation/quality/duration/timing/severity/associated sxs/prior treatment) HPI Patient with headache that she noted on awakening this a.m.  Headache is sharp and all over head.  She is photophobic with nausea but no vomiting.  No focal deficits, fever, or stiff neck.  Patient seen here three days ago with uti.  Patient had chest pain that day and was noted to have hypokalemia.  Patient took tramadol today without relief.  PMD is Dr. Elise Benne with [facility] medical [facility].   Past Medical History  Diagnosis Date  . Arthritis   . Fibromyalgia   . Mitral valve prolapse   . Hypertension     Past Surgical History  Procedure Date  . Abdominal hysterectomy   . Nissan fundiplication   . Knee surgery   . Cholecystectomy     No family history on file.  History  Substance Use Topics  . Smoking status: Never Smoker   . Smokeless tobacco: Never Used  . Alcohol Use: No    OB History    Grav Para Term Preterm Abortions TAB SAB Ect Mult Living                  Review of Systems  Constitutional: Positive for unexpected weight change.    Allergies  Augmentin; Codeine; and Tetracyclines & related  Home Medications   Current Outpatient Rx  Name Route Sig Dispense Refill  . AMLODIPINE BESYLATE 10 MG PO TABS Oral Take 10 mg by mouth daily.      . ASPIRIN EC 81 MG PO TBEC Oral Take 81 mg by mouth daily.      . CEPHALEXIN 500 MG PO CAPS Oral Take 1 capsule (500 mg total) by mouth 4 (four) times daily. 40 capsule 0  . CIPROFLOXACIN HCL 500 MG PO TABS Oral Take 500 mg by mouth 2 (two) times daily.      . CYCLOBENZAPRINE HCL 10 MG PO TABS Oral Take 10 mg by mouth 3 (three) times daily.      . DULOXETINE HCL 60 MG PO CPEP Oral Take 120 mg by mouth daily.      Marland Kitchen FEXOFENADINE HCL  180 MG PO TABS Oral Take 180 mg by mouth daily as needed. For allergies     . LISINOPRIL 40 MG PO TABS Oral Take 40 mg by mouth daily.      Marland Kitchen METRONIDAZOLE 500 MG PO TABS Oral Take 500 mg by mouth 2 (two) times daily.      . ADULT MULTIVITAMIN W/MINERALS CH Oral Take 1 tablet by mouth daily.      Marland Kitchen POTASSIUM CHLORIDE CRYS CR 20 MEQ PO TBCR Oral Take 20 mEq by mouth 2 (two) times daily.      Marland Kitchen PROMETHAZINE HCL 25 MG PO TABS Oral Take 25 mg by mouth every 6 (six) hours as needed. For nausea      . TRAMADOL HCL 50 MG PO TABS Oral Take 50 mg by mouth 3 (three) times daily. Maximum dose= 8 tablets per day     . VITAMIN D (ERGOCALCIFEROL) 50000 UNITS PO CAPS Oral Take 50,000 Units by mouth every 7 (seven) days. Take on Friday     . ZOLPIDEM TARTRATE 10 MG PO TABS Oral Take 10 mg by mouth at bedtime.  BP 149/98  Pulse 97  Temp(Src) 99.3 F (37.4 C) (Oral)  Resp 18  Ht 5\' 7"  (1.702 m)  Wt 166 lb (75.297 kg)  BMI 26.00 kg/m2  SpO2 100%  Physical Exam  ED Course  LUMBAR PUNCTURE Date/Time: 11/14/2011 10:25 PM Performed by: Hilario Quarry Authorized by: Hilario Quarry Consent: Verbal consent obtained. Written consent obtained. Risks and benefits: risks, benefits and alternatives were discussed Consent given by: patient Patient identity confirmed: verbally with patient, arm band and hospital-assigned identification number Time out: Immediately prior to procedure a "time out" was called to verify the correct patient, procedure, equipment, support staff and site/side marked as required. Indications: evaluation for infection and evaluation for subarachnoid hemorrhage Anesthesia: local infiltration Local anesthetic: lidocaine 1% with epinephrine Anesthetic total: 2 ml Patient sedated: no Preparation: Patient was prepped and draped in the usual sterile fashion. Lumbar space: L4-L5 interspace Patient's position: left lateral decubitus Needle gauge: 22 Number of attempts: 1 Fluid  appearance: clear Tubes of fluid: 4 Total volume: 2 ml Post-procedure: site cleaned and adhesive bandage applied Patient tolerance: Patient tolerated the procedure well with no immediate complications.   (including critical care time)  Labs Reviewed - No data to display No results found.   No diagnosis found.    MDM   Date: 11/14/2011  Rate: 86  Rhythm: normal sinus rhythm  QRS Axis: normal  Intervals: normal  ST/T Wave abnormalities: t wave inversion inferolateral leads  Conduction Disutrbances:none  Narrative Interpretation:   Old EKG Reviewed: changes noted  Results for orders placed during the hospital encounter of 11/14/11  CBC      Component Value Range   WBC 4.4  4.0 - 10.5 (K/uL)   RBC 4.59  3.87 - 5.11 (MIL/uL)   Hemoglobin 12.7  12.0 - 15.0 (g/dL)   HCT 09.8  11.9 - 14.7 (%)   MCV 80.6  78.0 - 100.0 (fL)   MCH 27.7  26.0 - 34.0 (pg)   MCHC 34.3  30.0 - 36.0 (g/dL)   RDW 82.9  56.2 - 13.0 (%)   Platelets 383  150 - 400 (K/uL)  DIFFERENTIAL      Component Value Range   Neutrophils Relative 54  43 - 77 (%)   Neutro Abs 2.4  1.7 - 7.7 (K/uL)   Lymphocytes Relative 37  12 - 46 (%)   Lymphs Abs 1.6  0.7 - 4.0 (K/uL)   Monocytes Relative 6  3 - 12 (%)   Monocytes Absolute 0.3  0.1 - 1.0 (K/uL)   Eosinophils Relative 2  0 - 5 (%)   Eosinophils Absolute 0.1  0.0 - 0.7 (K/uL)   Basophils Relative 1  0 - 1 (%)   Basophils Absolute 0.0  0.0 - 0.1 (K/uL)  COMPREHENSIVE METABOLIC PANEL      Component Value Range   Sodium 140  135 - 145 (mEq/L)   Potassium 3.0 (*) 3.5 - 5.1 (mEq/L)   Chloride 100  96 - 112 (mEq/L)   CO2 27  19 - 32 (mEq/L)   Glucose, Bld 98  70 - 99 (mg/dL)   BUN 9  6 - 23 (mg/dL)   Creatinine, Ser 8.65  0.50 - 1.10 (mg/dL)   Calcium 78.4 (*) 8.4 - 10.5 (mg/dL)   Total Protein 9.1 (*) 6.0 - 8.3 (g/dL)   Albumin 4.9  3.5 - 5.2 (g/dL)   AST 48 (*) 0 - 37 (U/L)   ALT 266 (*) 0 - 35 (U/L)  Alkaline Phosphatase 169 (*) 39 - 117 (U/L)   Total  Bilirubin 0.5  0.3 - 1.2 (mg/dL)   GFR calc non Af Amer 53 (*) >90 (mL/min)   GFR calc Af Amer 62 (*) >90 (mL/min)  PROTEIN AND GLUCOSE, CSF      Component Value Range   Glucose, CSF 61  43 - 76 (mg/dL)   Total  Protein, CSF 44  15 - 45 (mg/dL)  CSF CELL COUNT WITH DIFFERENTIAL      Component Value Range   Tube # 1     Color, CSF COLORLESS  COLORLESS    Appearance, CSF CLEAR  CLEAR    Supernatant NOT INDICATED     RBC Count, CSF 0  0 (/cu mm)   WBC, CSF 2  0 - 5 (/cu mm)   Lymphs, CSF RARE  40 - 80 (%)   Other Cells, CSF TOO FEW TO COUNT, SMEAR AVAILABLE FOR REVIEW    CSF CELL COUNT WITH DIFFERENTIAL      Component Value Range   Tube # 4     Color, CSF COLORLESS  COLORLESS    Appearance, CSF CLEAR  CLEAR    Supernatant NOT INDICATED     RBC Count, CSF 1 (*) 0 (/cu mm)   WBC, CSF 1  0 - 5 (/cu mm)   Lymphs, CSF FEW  40 - 80 (%)   Monocyte-Macrophage-Spinal Fluid RARE  15 - 45 (%)   Other Cells, CSF TOO FEW TO COUNT, SMEAR AVAILABLE FOR REVIEW    GRAM STAIN      Component Value Range   Specimen Description CSF     Special Requests NONE     Gram Stain       Value: WBC PRESENT, PREDOMINANTLY MONONUCLEAR     NO ORGANISMS SEEN     CYTOSPIN   Report Status 11/14/2011 FINAL     Patient now gives history of chronic leukopenia. She has had also states now that she has had a 30 pound weight loss over the past 6 weeks. She states that this has been unintentional. Headache reduced greatly with treatment here in the emergency department. However given the severity of the headache she did have a diagnostic lumbar puncture. This shows no evidence of current infection or bleeding. I spoke with Dr. Mellody Drown on and he agrees with inpatient treatment and for further diagnostic tests. She is hypercalcemic here, has hypokalemia, and an abnormal EKG. She has had 30 pound weight loss is unexplained. They further workup for malignancy and hyperparathyroidism. Upon discussion of the transfer with the  patient she states that she did have a CT scan done as an outpatient at [facility]. She has a results with her and I reviewed this at its base uncomplicated diverticulosis, status post cholecystectomy, and stable liver lesions compatible with cysts.  Hilario Quarry, MD 11/15/11 [phone]

## 2011-11-14 NOTE — ED Notes (Signed)
Pt reports a headache and nausea that started yesterday.

## 2011-11-15 ENCOUNTER — Encounter (HOSPITAL_COMMUNITY): Payer: Self-pay | Admitting: Internal Medicine

## 2011-11-15 DIAGNOSIS — I10 Essential (primary) hypertension: Secondary | ICD-10-CM

## 2011-11-15 DIAGNOSIS — E876 Hypokalemia: Secondary | ICD-10-CM

## 2011-11-15 DIAGNOSIS — R51 Headache: Secondary | ICD-10-CM

## 2011-11-15 DIAGNOSIS — R519 Headache, unspecified: Secondary | ICD-10-CM

## 2011-11-15 LAB — BASIC METABOLIC PANEL
Calcium: 10.6 mg/dL — ABNORMAL HIGH (ref 8.4–10.5)
GFR calc non Af Amer: 68 mL/min — ABNORMAL LOW (ref >90)
Glucose, Bld: 119 mg/dL — ABNORMAL HIGH (ref 70–99)
Sodium: 136 mEq/L (ref 135–145)

## 2011-11-15 LAB — CBC
MCH: 27.6 pg (ref 26.0–34.0)
Platelets: 300 10*3/uL (ref 150–400)
RBC: 4.13 MIL/uL (ref 3.87–5.11)
WBC: 6.3 10*3/uL (ref 4.0–10.5)

## 2011-11-15 MED ORDER — MORPHINE SULFATE 4 MG/ML IJ SOLN
4.0000 mg | Freq: Once | INTRAMUSCULAR | Status: AC
  Administered 2011-11-15: 4 mg via INTRAVENOUS
  Filled 2011-11-15: qty 1

## 2011-11-15 MED ORDER — ONDANSETRON HCL 4 MG PO TABS: 4.0000 mg | ORAL_TABLET | Freq: Four times a day (QID) | ORAL | Status: DC | PRN

## 2011-11-15 MED ORDER — POTASSIUM CHLORIDE IN NACL 20-0.9 MEQ/L-% IV SOLN
INTRAVENOUS | Status: DC
  Administered 2011-11-15: 06:00:00 via INTRAVENOUS
  Filled 2011-11-15 (×4): qty 1000

## 2011-11-15 MED ORDER — ACETAMINOPHEN 325 MG PO TABS
650.0000 mg | ORAL_TABLET | Freq: Four times a day (QID) | ORAL | Status: DC | PRN
  Administered 2011-11-15 – 2011-11-17 (×2): 650 mg via ORAL
  Filled 2011-11-15 (×2): qty 2

## 2011-11-15 MED ORDER — ENOXAPARIN SODIUM 40 MG/0.4ML ~~LOC~~ SOLN
40.0000 mg | Freq: Every day | SUBCUTANEOUS | Status: DC
  Administered 2011-11-15 – 2011-11-19 (×5): 40 mg via SUBCUTANEOUS
  Filled 2011-11-15 (×5): qty 0.4

## 2011-11-15 MED ORDER — OXYCODONE HCL 5 MG PO TABS
5.0000 mg | ORAL_TABLET | ORAL | Status: DC | PRN
  Administered 2011-11-17 – 2011-11-19 (×4): 5 mg via ORAL
  Filled 2011-11-15 (×4): qty 1

## 2011-11-15 MED ORDER — ZOLPIDEM TARTRATE 5 MG PO TABS
10.0000 mg | ORAL_TABLET | Freq: Every evening | ORAL | Status: DC | PRN
  Administered 2011-11-15 – 2011-11-18 (×4): 10 mg via ORAL
  Filled 2011-11-15 (×4): qty 2

## 2011-11-15 MED ORDER — FLUCONAZOLE 150 MG PO TABS
150.0000 mg | ORAL_TABLET | Freq: Every day | ORAL | Status: AC
  Administered 2011-11-15 – 2011-11-16 (×2): 150 mg via ORAL
  Filled 2011-11-15 (×2): qty 1

## 2011-11-15 MED ORDER — SODIUM CHLORIDE 0.9 % IV SOLN
60.0000 mg | Freq: Once | INTRAVENOUS | Status: AC
  Administered 2011-11-15: 60 mg via INTRAVENOUS
  Filled 2011-11-15: qty 500

## 2011-11-15 MED ORDER — ENOXAPARIN SODIUM 40 MG/0.4ML ~~LOC~~ SOLN: 40.0000 mg | SUBCUTANEOUS | Status: DC

## 2011-11-15 MED ORDER — HYDROMORPHONE HCL PF 1 MG/ML IJ SOLN
0.5000 mg | INTRAMUSCULAR | Status: DC | PRN
  Filled 2011-11-15: qty 1

## 2011-11-15 MED ORDER — ALUM & MAG HYDROXIDE-SIMETH 200-200-20 MG/5ML PO SUSP: 30.0000 mL | Freq: Four times a day (QID) | ORAL | Status: DC | PRN

## 2011-11-15 MED ORDER — ZOLPIDEM TARTRATE 5 MG PO TABS: 5.0000 mg | ORAL_TABLET | Freq: Every evening | ORAL | Status: DC | PRN

## 2011-11-15 MED ORDER — ACETAMINOPHEN 650 MG RE SUPP: 650.0000 mg | Freq: Four times a day (QID) | RECTAL | Status: DC | PRN

## 2011-11-15 MED ORDER — MAGIC MOUTHWASH
2.0000 mL | Freq: Two times a day (BID) | ORAL | Status: DC
  Administered 2011-11-15: 21:00:00 via ORAL
  Administered 2011-11-16 – 2011-11-19 (×7): 2 mL via ORAL
  Filled 2011-11-15 (×9): qty 5

## 2011-11-15 MED ORDER — ONDANSETRON HCL 4 MG/2ML IJ SOLN: 4.0000 mg | Freq: Four times a day (QID) | INTRAMUSCULAR | Status: DC | PRN

## 2011-11-15 MED ORDER — ONDANSETRON HCL 4 MG/2ML IJ SOLN
4.0000 mg | Freq: Once | INTRAMUSCULAR | Status: AC
  Administered 2011-11-15: 4 mg via INTRAVENOUS
  Filled 2011-11-15: qty 2

## 2011-11-15 MED ORDER — PANTOPRAZOLE SODIUM 40 MG PO TBEC
40.0000 mg | DELAYED_RELEASE_TABLET | Freq: Every day | ORAL | Status: DC
  Administered 2011-11-15 – 2011-11-19 (×5): 40 mg via ORAL
  Filled 2011-11-15 (×5): qty 1

## 2011-11-15 NOTE — ED Notes (Signed)
Attempted to call report to floor. RN unavailable at this time. 

## 2011-11-15 NOTE — Progress Notes (Signed)
5:00 PM I agree with HPI/GPe and A/P per Dr. Lovell Sheehan.  Has lost 30 pounds since November, and some diarrhoea. Some cramping in abdomen as well.  Treated in November and as didn't get better, got another Ct scan.  Headaches are better from this am, has some tingling in her hands and head.  Some stomach discomfort as well-tolerates soups and soft diet as well.  Has not been told she has any thryoid disease but has allegedly been told she might ned a theyroid ultrasound Has been told by Dr. Loreta Ave she has Fatty liver in the past with a liver biospy        BP 149/92  Pulse 99  Temp(Src) 98.2 F (36.8 C) (Oral)  Resp 18  Ht 5\' 7"  (1.702 m)  Wt 74.345 kg (163 lb 14.4 oz)  BMI 25.67 kg/m2  SpO2 100% General appearance: alert and cooperative Throat: lips, mucosa, and tongue normal; teeth and gums normal Neck: no adenopathy, no carotid bruit, no JVD, supple, symmetrical, trachea midline and thyroid not enlarged, symmetric, no tenderness/mass/nodules Lungs: clear to auscultation bilaterally Heart: regular rate and rhythm, S1, S2 normal, no murmur, click, rub or gallop Abdomen: slighrtly tender in epigastrium   Patient Active Hospital Problem List: Hypercalcemia (11/15/2011)   Assessment: PTH pending.  D/c Vit d.  Will follow-qwork-up She takes MVI and Vit D.  Calcium not severely elevated. Will reassess.  Fully explained calcium issue to her  Hypokalemia (11/15/2011)   Assessment: replacng  Headache (11/15/2011)   Assessment: resolving    HTN (hypertension) (11/15/2011)   Assessment: Mod controlled  Pleas Koch, MD [facility] [phone]

## 2011-11-15 NOTE — Progress Notes (Signed)
Patient is alert and oriented. 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 She is alert x 4 and ambulatory. No skin issues. Patient was oriented to the unit. Waiting on MD to come and admit patient. Will continue to monitor.Lorin Picket, [employer] [place]

## 2011-11-15 NOTE — ED Notes (Signed)
Attempted to call report again.  RN not available at this time.  Informed sec staff that [facility] is enroute to floor/[facility]

## 2011-11-15 NOTE — H&P (Signed)
DATE OF ADMISSION:  11/15/2011  PCP:  Dr.  Elise Benne at [facility] in [place]  Chief Complaint: Headache, tingling and burning in hands and nausea and vomting   HPI: Christina Vang is an 60 y.o. female who was seen in the ED at [facility] twice for complaints of severe headaches, paresthesias in both hands, and nausea and vomiting.  She also reports having an unintentional weight loss of 10 lbs over the past 2 weeks.  She had been seen by coverage in her PCP's office and in the ED on Christmas day and was diagnosed with a UTI, and Diverticulitis, she was  Placed on Cipro, and flagyl at that time.   She returned to the ED yesterday for continued symptoms.    Patient was evaluated in the ED and underwent a CT Scan of the Brain, and a lumbar puncture, and her laboratory studies revealed an elevated calcium and a decreased potassium level.  She was transferred to [facility] for further evaluation.    Past Medical History  Diagnosis Date  . Arthritis   . Fibromyalgia   . Mitral valve prolapse   . Hypertension     Past Surgical History  Procedure Date  . Abdominal hysterectomy   . Nissan fundiplication   . Knee surgery   . Cholecystectomy   . Liver biopsy     Medications:  HOME MEDS: Prior to Admission medications   Medication Sig Start Date End Date Taking? Authorizing Provider  amLODipine (NORVASC) 10 MG tablet Take 10 mg by mouth daily.     Yes Historical Provider, MD  aspirin EC 81 MG tablet Take 81 mg by mouth daily.     Yes Historical Provider, MD  cephALEXin (KEFLEX) 500 MG capsule Take 1 capsule (500 mg total) by mouth 4 (four) times daily. 11/12/11 11/22/11 Yes Meagan Hunt, MD  ciprofloxacin (CIPRO) 500 MG tablet Take 500 mg by mouth 2 (two) times daily.     Yes Historical Provider, MD  cyclobenzaprine (FLEXERIL) 10 MG tablet Take 10 mg by mouth 3 (three) times daily.     Yes Historical Provider, MD  DULoxetine (CYMBALTA) 60 MG capsule Take 120  mg by mouth daily.     Yes Historical Provider, MD  fexofenadine (ALLEGRA) 180 MG tablet Take 180 mg by mouth daily as needed. For allergies    Yes Historical Provider, MD  lisinopril (PRINIVIL,ZESTRIL) 40 MG tablet Take 40 mg by mouth daily.     Yes Historical Provider, MD  metroNIDAZOLE (FLAGYL) 500 MG tablet Take 500 mg by mouth 2 (two) times daily.     Yes Historical Provider, MD  Multiple Vitamin (MULITIVITAMIN WITH MINERALS) TABS Take 1 tablet by mouth daily.     Yes Historical Provider, MD  potassium chloride SA (K-DUR,KLOR-CON) 20 MEQ tablet Take 20 mEq by mouth 2 (two) times daily.     Yes Historical Provider, MD  promethazine (PHENERGAN) 25 MG tablet Take 25 mg by mouth every 6 (six) hours as needed. For nausea     Yes Historical Provider, MD  traMADol (ULTRAM) 50 MG tablet Take 50 mg by mouth 3 (three) times daily. Maximum dose= 8 tablets per day    Yes Historical Provider, MD  Vitamin D, Ergocalciferol, (DRISDOL) 50000 UNITS CAPS Take 50,000 Units by mouth every 7 (seven) days. Take on Friday    Yes Historical Provider, MD  zolpidem (AMBIEN) 10 MG tablet Take 10 mg by mouth at bedtime.  Yes Historical Provider, MD    Allergies:  Allergies  Allergen Reactions  . Augmentin (Amoxicillin-Pot Clavulanate) Nausea And Vomiting  . Codeine Nausea And Vomiting  . Tetracyclines & Related Nausea And Vomiting    Social History:   reports that she has never smoked. She has never used smokeless tobacco. She reports that she does not drink alcohol or use illicit drugs.  Family History: Family History  Problem Relation Age of Onset  . Hypertension Mother   . Hypertension Father   . Stroke Mother   . Stroke Father   . Diabetes Mother   . Diabetes Sister   . Diabetes Brother     Review of Systems:  The patient denies vision loss, decreased hearing, hoarseness, chest pain, syncope, dyspnea on exertion, peripheral edema, balance deficits, hemoptysis, abdominal pain, melena,  hematochezia, severe indigestion/heartburn, hematuria, incontinence, genital sores, muscle weakness, suspicious skin lesions, transient blindness, difficulty walking, depression, unusual weight change, abnormal bleeding, enlarged lymph nodes, angioedema, and breast masses.   Physical Exam:  GEN:  Pleasant 60 year old African American Female examined  and in no acute distress; cooperative with exam Filed Vitals:   11/14/11 2145 11/15/11 0043 11/15/11 0214 11/15/11 0400  BP: 145/90 130/89 132/88 150/91  Pulse: 81 88 88 85  Temp:   99 F (37.2 C) 98.3 F (36.8 C)  TempSrc:    Oral  Resp:  20 20 20   Height:    5\' 7"  (1.702 m)  Weight:    74.345 kg (163 lb 14.4 oz)  SpO2: 98% 98%  99%   Blood pressure 150/91, pulse 85, temperature 98.3 F (36.8 C), temperature source Oral, resp. rate 20, height 5\' 7"  (1.702 m), weight 74.345 kg (163 lb 14.4 oz), SpO2 99.00%. PSYCH: SHe is alert and oriented x4; does not appear anxious does not appear depressed; affect is normal HEENT: Normocephalic and Atraumatic, Mucous membranes pink; PERRLA; EOM intact; Fundi:  Benign;  No scleral icterus, Nares: Patent, Oropharynx: trace whitish tongue exudate, Fair Dentition, Neck:  FROM, no cervical lymphadenopathy nor thyromegaly or carotid bruit; no JVD; Breasts:: Not examined CHEST WALL: No tenderness CHEST: Normal respiration, clear to auscultation bilaterally HEART: Regular rate and rhythm; no murmurs rubs or gallops BACK: No kyphosis or scoliosis; no CVA tenderness ABDOMEN: Positive Bowel Sounds,  Obese, soft non-tender; no masses, no organomegaly, no pannus; no intertriginous candida. Rectal Exam: Not done EXTREMITIES: No bone or joint deformity; age-appropriate arthropathy of the hands and knees; no cyanosis, clubbing or edema; no ulcerations. Genitalia: not examined PULSES: 2+ and symmetric SKIN: Normal hydration no rash or ulceration CNS: Cranial nerves 2-12 grossly intact no focal neurologic deficit     Labs & Imaging Results for orders placed during the hospital encounter of 11/14/11 (from the past 48 hour(s))  CBC     Status: Normal   Collection Time   11/14/11  5:28 PM      Component Value Range Comment   WBC 4.4  4.0 - 10.5 (K/uL)    RBC 4.59  3.87 - 5.11 (MIL/uL)    Hemoglobin 12.7  12.0 - 15.0 (g/dL)    HCT 16.1  09.6 - 04.5 (%)    MCV 80.6  78.0 - 100.0 (fL)    MCH 27.7  26.0 - 34.0 (pg)    MCHC 34.3  30.0 - 36.0 (g/dL)    RDW 40.9  81.1 - 91.4 (%)    Platelets 383  150 - 400 (K/uL)   DIFFERENTIAL  Status: Normal   Collection Time   11/14/11  5:28 PM      Component Value Range Comment   Neutrophils Relative 54  43 - 77 (%)    Neutro Abs 2.4  1.7 - 7.7 (K/uL)    Lymphocytes Relative 37  12 - 46 (%)    Lymphs Abs 1.6  0.7 - 4.0 (K/uL)    Monocytes Relative 6  3 - 12 (%)    Monocytes Absolute 0.3  0.1 - 1.0 (K/uL)    Eosinophils Relative 2  0 - 5 (%)    Eosinophils Absolute 0.1  0.0 - 0.7 (K/uL)    Basophils Relative 1  0 - 1 (%)    Basophils Absolute 0.0  0.0 - 0.1 (K/uL)   COMPREHENSIVE METABOLIC PANEL     Status: Abnormal   Collection Time   11/14/11  5:28 PM      Component Value Range Comment   Sodium 140  135 - 145 (mEq/L)    Potassium 3.0 (*) 3.5 - 5.1 (mEq/L)    Chloride 100  96 - 112 (mEq/L)    CO2 27  19 - 32 (mEq/L)    Glucose, Bld 98  70 - 99 (mg/dL)    BUN 9  6 - 23 (mg/dL)    Creatinine, Ser 9.60  0.50 - 1.10 (mg/dL)    Calcium 45.4 (*) 8.4 - 10.5 (mg/dL)    Total Protein 9.1 (*) 6.0 - 8.3 (g/dL)    Albumin 4.9  3.5 - 5.2 (g/dL)    AST 48 (*) 0 - 37 (U/L)    ALT 266 (*) 0 - 35 (U/L)    Alkaline Phosphatase 169 (*) 39 - 117 (U/L)    Total Bilirubin 0.5  0.3 - 1.2 (mg/dL)    GFR calc non Af Amer 53 (*) >90 (mL/min)    GFR calc Af Amer 62 (*) >90 (mL/min)   CSF CELL COUNT WITH DIFFERENTIAL     Status: Abnormal   Collection Time   11/14/11  7:15 PM      Component Value Range Comment   Tube # 4      Color, CSF COLORLESS  COLORLESS      Appearance, CSF CLEAR  CLEAR     Supernatant NOT INDICATED      RBC Count, CSF 1 (*) 0 (/cu mm)    WBC, CSF 1  0 - 5 (/cu mm)    Lymphs, CSF FEW  40 - 80 (%)    Monocyte-Macrophage-Spinal Fluid RARE  15 - 45 (%)    Other Cells, CSF TOO FEW TO COUNT, SMEAR AVAILABLE FOR REVIEW     GRAM STAIN     Status: Normal   Collection Time   11/14/11  7:15 PM      Component Value Range Comment   Specimen Description CSF      Special Requests NONE      Gram Stain        Value: WBC PRESENT, PREDOMINANTLY MONONUCLEAR     NO ORGANISMS SEEN     CYTOSPIN   Report Status 11/14/2011 FINAL     PROTEIN AND GLUCOSE, CSF     Status: Normal   Collection Time   11/14/11  7:25 PM      Component Value Range Comment   Glucose, CSF 61  43 - 76 (mg/dL)    Total  Protein, CSF 44  15 - 45 (mg/dL)   CSF CELL COUNT WITH DIFFERENTIAL  Status: Normal   Collection Time   11/14/11  7:25 PM      Component Value Range Comment   Tube # 1      Color, CSF COLORLESS  COLORLESS     Appearance, CSF CLEAR  CLEAR     Supernatant NOT INDICATED      RBC Count, CSF 0  0 (/cu mm)    WBC, CSF 2  0 - 5 (/cu mm)    Lymphs, CSF RARE  40 - 80 (%)    Other Cells, CSF TOO FEW TO COUNT, SMEAR AVAILABLE FOR REVIEW      Ct Head Wo Contrast  11/14/2011  *RADIOLOGY REPORT*  Clinical Data: Headache.  Nausea.  Dizziness.  CT HEAD WITHOUT CONTRAST  Technique:  Contiguous axial images were obtained from the base of the skull through the vertex without contrast.  Comparison: None.  Findings: A subtle 5 x 3 mm hypodensity along the anterior limb of the right internal capsule is shown on image 14 of series 2, potentially artifact or a tiny remote lacunar infarct.  The brain stem, cerebellum, cerebral peduncles, thalami, basal ganglia, basilar cisterns, and ventricular system appear unremarkable.  No intracranial hemorrhage, mass lesion, or acute infarction is identified.  The visualized paranasal sinuses appear clear.  IMPRESSION:  1.  No acute  intracranial findings. 2.  Some faint hypodensity in the anterior limb of the right internal capsule may be artifact or due to a tiny remote lacunar infarct.  Original Report Authenticated By: Dellia Cloud, M.D.      Assessment/Plan: 1.   Hypercalcemia- admitted for further evaluation and Rx , IV Pamidronate 60mg  X 1 ordered.   2.   Hypokalemia due to # 4- Replete K+, and check Magnesium level replete in needed.   3.   Headache- due to electrolyte imbalance and dehydration, pain control, hydrate, and correct electrolytes.    4.   Nausea and Vomiting- antiemetics PRN, the etiology may be due to her Antibiotic Rx,  5.   Weakness- due to #1 and #2 6.   Paresthesia due to # 1 and # 2 7.   HTN -stable 8.   Oral candidiasis- fluconazole  PO q day X 3 9.   Obesity-  10. Fibromyalgia- Pain Control.     Other plans as per orders.    CODE STATUS:      FULL CODE         Felicha Frayne C 11/15/2011, 5:07 AM

## 2011-11-15 NOTE — ED Notes (Signed)
Pt c/o her chronic back pain to nsg staff. Morphine ordered, zofran. Admitted, Awaiting transfer.  Forbes Cellar, MD 11/15/11 [phone]

## 2011-11-16 ENCOUNTER — Inpatient Hospital Stay (HOSPITAL_COMMUNITY): Payer: BC Managed Care – PPO

## 2011-11-16 DIAGNOSIS — K760 Fatty (change of) liver, not elsewhere classified: Secondary | ICD-10-CM

## 2011-11-16 DIAGNOSIS — E0590 Thyrotoxicosis, unspecified without thyrotoxic crisis or storm: Secondary | ICD-10-CM

## 2011-11-16 LAB — TSH: TSH: 0.265 u[IU]/mL — ABNORMAL LOW (ref 0.350–4.500)

## 2011-11-16 LAB — T4, FREE: Free T4: 1.2 ng/dL (ref 0.80–1.80)

## 2011-11-16 LAB — T3, FREE: T3, Free: 2.7 pg/mL (ref 2.3–4.2)

## 2011-11-16 MED ORDER — POTASSIUM CHLORIDE CRYS ER 20 MEQ PO TBCR
EXTENDED_RELEASE_TABLET | ORAL | Status: AC
  Administered 2011-11-16: 20 meq
  Filled 2011-11-16: qty 1

## 2011-11-16 MED ORDER — POTASSIUM CHLORIDE 20 MEQ PO PACK
20.0000 meq | PACK | Freq: Two times a day (BID) | ORAL | Status: DC
  Administered 2011-11-16: 20 meq via ORAL
  Filled 2011-11-16 (×2): qty 1

## 2011-11-16 MED ORDER — POTASSIUM CHLORIDE CRYS ER 20 MEQ PO TBCR
20.0000 meq | EXTENDED_RELEASE_TABLET | Freq: Two times a day (BID) | ORAL | Status: DC
  Administered 2011-11-17 – 2011-11-19 (×5): 20 meq via ORAL
  Filled 2011-11-16 (×6): qty 1

## 2011-11-16 NOTE — Progress Notes (Signed)
[facility] HOSPITALIST progress note    Interval h/o:- 60 yr old female with htn/headache, and multiple Ed visits for seemingly disconnected issues including UTI and diverticular disease, presented 12.29 with hypercalcemia and hypokalemia and altered LFT's Was found to be signifcantly hypothryroid, and has antecedent 30 pounds of weight loss as well as hair loss with anxiety and diarrhoea   Subjective: Feels tired today.  Had some vague CP which she thinks was her reflux, didn't go down arm or anything.  Feels when she presses her head she feels weird    Objective: Vital signs in last 24 hours: Temp:  [97.8 F (36.6 C)-98.2 F (36.8 C)] 98.2 F (36.8 C) (12/30 0900) Pulse Rate:  [64-91] 91  (12/30 0900) Resp:  [18-20] 18  (12/30 0900) BP: (127-147)/(75-88) 143/88 mmHg (12/30 0900) SpO2:  [95 %-100 %] 99 % (12/30 0900) Weight change:   Intake/Output Summary (Last 24 hours) at 11/16/11 1338 Last data filed at 11/16/11 0600  Gross per 24 hour  Intake   1240 ml  Output      0 ml  Net   1240 ml    General appearance: alert and cooperative  Throat: lips, mucosa, and tongue normal; teeth and gums normal  Neck: no adenopathy, no carotid bruit, no JVD, supple, symmetrical, trachea midline and thyroid not enlarged, symmetric, no tenderness/mass/nodules  Lungs: clear to auscultation bilaterally  Heart: regular rate and rhythm, S1, S2 normal, no murmur, click, rub or gallop  Abdomen: slighrtly tender in epigastrium  BP 143/88  Pulse 91  Temp(Src) 98.2 F (36.8 C) (Oral)  Resp 18  Ht 5\' 7"  (1.702 m)  Wt 74.345 kg (163 lb 14.4 oz)  BMI 25.67 kg/m2  SpO2 99%  Lab Results:  [facility] 11/15/11 0635 11/14/11 1728  NA 136 140  K 4.5 3.0*  CL 105 100  CO2 21 27  GLUCOSE 119* 98  BUN 12 9  CREATININE 0.90 1.10  CALCIUM 10.6* 11.3*  MG -- --  PHOS -- --    Basename 11/14/11 1728  AST 48*  ALT 266*  ALKPHOS 169*  BILITOT 0.5  PROT 9.1*  ALBUMIN 4.9   No results found for  this basename: LIPASE:2,AMYLASE:2 in the last 72 hours  Basename 11/15/11 0635 11/14/11 1728  WBC 6.3 4.4  NEUTROABS -- 2.4  HGB 11.4* 12.7  HCT 33.9* 37.0  MCV 82.1 80.6  PLT 300 383   No results found for this basename: CKTOTAL:3,CKMB:3,CKMBINDEX:3,TROPONINI:3 in the last 72 hours No components found with this basename: POCBNP:3 No results found for this basename: DDIMER:2 in the last 72 hours No results found for this basename: HGBA1C:2 in the last 72 hours No results found for this basename: CHOL:2,HDL:2,LDLCALC:2,TRIG:2,CHOLHDL:2,LDLDIRECT:2 in the last 72 hours  Basename 11/15/11 1756  TSH 0.265*  T4TOTAL --  T3FREE --  THYROIDAB --   No results found for this basename: VITAMINB12:2,FOLATE:2,FERRITIN:2,TIBC:2,IRON:2,RETICCTPCT:2 in the last 72 hours Micro Results: Recent Results (from the past 240 hour(s))  URINE CULTURE     Status: Normal   Collection Time   11/11/11 11:13 PM      Component Value Range Status Comment   Specimen Description URINE, CLEAN CATCH   Final    Special Requests NONE   Final    Setup Time [id number]   Final    Colony Count 1,000 COLONIES/ML   Final    Culture INSIGNIFICANT GROWTH   Final    Report Status 11/13/2011 FINAL   Final   GRAM STAIN  Status: Normal   Collection Time   11/14/11  7:15 PM      Component Value Range Status Comment   Specimen Description CSF   Final    Special Requests NONE   Final    Gram Stain     Final    Value: WBC PRESENT, PREDOMINANTLY MONONUCLEAR     NO ORGANISMS SEEN     CYTOSPIN   Report Status 11/14/2011 FINAL   Final   CSF CULTURE     Status: Normal (Preliminary result)   Collection Time   11/14/11  7:24 PM      Component Value Range Status Comment   Specimen Description CSF   Final    Special Requests Immunocompromised   Final    Gram Stain     Final    Value: CYTPSPIN WBC PRESENT, PREDOMINANTLY MONONUCLEAR     NO ORGANISMS SEEN     Performed at [facility]   Culture NO GROWTH 1 DAY    Final    Report Status PENDING   Incomplete     Medications: I have reviewed the patient's current medications. Scheduled Meds:   . enoxaparin  40 mg Subcutaneous Daily  . fluconazole  150 mg Oral Daily  . magic mouthwash  2 mL Oral BID  . pantoprazole  40 mg Oral Q1200   Continuous Infusions:   . 0.9 % NaCl with KCl 20 mEq / L 75 mL/hr at 11/15/11 0608   PRN Meds:.acetaminophen, acetaminophen, HYDROmorphone, ondansetron (ZOFRAN) IV, ondansetron, oxyCODONE, zolpidem, DISCONTD: zolpidem   Assessment/Plan: Patient Active Hospital Problem List: Hyperthyroidism without goitre-TSH 0.22 (11/16/2011)   Assessment: Will get T4 and free T3 If elevated will consider Endocrinology consult,-  Hold on Anti-TPO antibodies/Ultrasound of neck until initial testing done.  Hypercalcemia (11/15/2011)   Assessment: received one dose of IV pamidronate.  Will d/c IVF-herCalcium level is below 11, which corresponds more with either medication/non-malignancy realted Hypercalcemia>>Malignancy.  Await PTH result  Hypokalemia (11/15/2011)   Assessment: Resolved on repletioon  Headache (11/15/2011)   Assessment: Better  HTN (hypertension) (11/15/2011)   Assessment: moderately controlled.  Would start low dose CCB, as thiazide can elevate Caclium.    Steatosis of liver-per USG 12.30 (11/16/2011)   Assessment: USG done 12.30 concurs with steatosis and no organic pathology other than that.  monoitor    I have fully updated family as to the course of their family members care and have answered all questions that they had      LOS: 2 days   [facility] 11/16/2011, 1:38 PM

## 2011-11-17 MED ORDER — LEVOTHYROXINE SODIUM 50 MCG PO TABS
50.0000 ug | ORAL_TABLET | Freq: Every day | ORAL | Status: DC
  Administered 2011-11-19: 50 ug via ORAL
  Filled 2011-11-17 (×4): qty 1

## 2011-11-17 NOTE — Progress Notes (Signed)
11/17/2011 [facility] Case Management Note [phone]   Utilization review completed.  

## 2011-11-17 NOTE — Progress Notes (Signed)
[facility]PITAL[facility] progress note    Interval h/o:- 60 yr old female with htn/headache, and multiple Ed visits for seemingly disconnected issues including UTI and diverticular disease, presented 12.29 with hypercalcemia and hypokalemia and altered LFT's Was found to be signifcantly hypothryroid, and has antecedent 30 pounds of weight loss as well as hair loss with anxiety and diarrhoea   Subjective: emotional.  frustratd at not finding the reason to why she feesl this way.  Has tingling in he rarsma nd some headaches.  no CP.  Objective: Vital signs in last 24 hours: Temp:  [98.2 F (36.8 C)-98.7 F (37.1 C)] 98.4 F (36.9 C) (12/31 0534) Pulse Rate:  [61-91] 77  (12/31 0534) Resp:  [17-18] 18  (12/31 0534) BP: (112-147)/(71-96) 126/80 mmHg (12/31 0534) SpO2:  [97 %-100 %] 97 % (12/31 0534) Weight change:   Intake/Output Summary (Last 24 hours) at 11/17/11 0836 Last data filed at 11/17/11 0500  Gross per 24 hour  Intake   1275 ml  Output      0 ml  Net   1275 ml    General appearance: alert and cooperative-tearful Throat: lips, mucosa, and tongue normal; teeth and gums normal  Neck: no adenopathy, no carotid bruit, no JVD, supple, symmetrical, trachea midline and thyroid not enlarged, symmetric, no tenderness/mass/nodules  Lungs: clear to auscultation bilaterally  Heart: regular rate and rhythm, S1, S2 normal, no murmur, click, rub or gallop  Abdomen: slighrtly tender in epigastrium  BP 126/80  Pulse 77  Temp(Src) 98.4 F (36.9 C) (Oral)  Resp 18  Ht 5\' 7"  (1.702 m)  Wt 74.345 kg (163 lb 14.4 oz)  BMI 25.67 kg/m2  SpO2 97%  Lab Results:  [facility] 11/15/11 0635 11/14/11 1728  NA 136 140  K 4.5 3.0*  CL 105 100  CO2 21 27  GLUCOSE 119* 98  BUN 12 9  CREATININE 0.90 1.10  CALCIUM 10.6* 11.3*  MG -- --  PHOS -- --    Basename 11/14/11 1728  AST 48*  ALT 266*  ALKPHOS 169*  BILITOT 0.5  PROT 9.1*  ALBUMIN 4.9   No results found for this basename:  LIPASE:2,AMYLASE:2 in the last 72 hours  Basename 11/15/11 0635 11/14/11 1728  WBC 6.3 4.4  NEUTROABS -- 2.4  HGB 11.4* 12.7  HCT 33.9* 37.0  MCV 82.1 80.6  PLT 300 383   No results found for this basename: CKTOTAL:3,CKMB:3,CKMBINDEX:3,TROPONINI:3 in the last 72 hours No components found with this basename: POCBNP:3 No results found for this basename: DDIMER:2 in the last 72 hours No results found for this basename: HGBA1C:2 in the last 72 hours No results found for this basename: CHOL:2,HDL:2,LDLCALC:2,TRIG:2,CHOLHDL:2,LDLDIRECT:2 in the last 72 hours  Basename 11/16/11 1357 11/15/11 1756  TSH -- 0.265*  T4TOTAL -- --  T3FREE 2.7 --  THYROIDAB -- --   No results found for this basename: VITAMINB12:2,FOLATE:2,FERRITIN:2,TIBC:2,IRON:2,RETICCTPCT:2 in the last 72 hours Micro Results: Recent Results (from the past 240 hour(s))  URINE CULTURE     Status: Normal   Collection Time   11/11/11 11:13 PM      Component Value Range Status Comment   Specimen Description URINE, CLEAN CATCH   Final    Special Requests NONE   Final    Setup Time [id number]   Final    Colony Count 1,000 COLONIES/ML   Final    Culture INSIGNIFICANT GROWTH   Final    Report Status 11/13/2011 FINAL   Final   GRAM STAIN  Status: Normal   Collection Time   11/14/11  7:15 PM      Component Value Range Status Comment   Specimen Description CSF   Final    Special Requests NONE   Final    Gram Stain     Final    Value: WBC PRESENT, PREDOMINANTLY MONONUCLEAR     NO ORGANISMS SEEN     CYTOSPIN   Report Status 11/14/2011 FINAL   Final   CSF CULTURE     Status: Normal (Preliminary result)   Collection Time   11/14/11  7:24 PM      Component Value Range Status Comment   Specimen Description CSF   Final    Special Requests Immunocompromised   Final    Gram Stain     Final    Value: CYTPSPIN WBC PRESENT, PREDOMINANTLY MONONUCLEAR     NO ORGANISMS SEEN     Performed at [facility]   Culture NO  GROWTH 1 DAY   Final    Report Status PENDING   Incomplete     Medications: I have reviewed the patient's current medications. Scheduled Meds:    . enoxaparin  40 mg Subcutaneous Daily  . fluconazole  150 mg Oral Daily  . magic mouthwash  2 mL Oral BID  . pantoprazole  40 mg Oral Q1200  . potassium chloride SA      . potassium chloride SA      . potassium chloride  20 mEq Oral BID  . DISCONTD: potassium chloride  20 mEq Oral BID   Continuous Infusions:    . DISCONTD: 0.9 % NaCl with KCl 20 mEq / L 75 mL/hr at 11/15/11 0608   PRN Meds:.acetaminophen, acetaminophen, ondansetron (ZOFRAN) IV, ondansetron, oxyCODONE, zolpidem, DISCONTD: HYDROmorphone   Assessment/Plan: Patient Active Hospital Problem List: Hyperthyroidism without goitre-TSH 0.22 (11/16/2011)   Assessment: TSH 0.22, T4 1.2, T3 2.7 Will ultrasound neck after discussion with Radiologist, as would want to image PAathyroids as well.  Concern for MNG? Will start thyroxine and see if her symptoms resolve  Hypercalcemia (11/15/2011)   Assessment: received one dose of IV pamidronate.  Will d/c IVF-herCalcium level is below 11, which corresponds more with either medication/non-malignancy realted Hypercalcemia>>Malignancy.   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  Would start low dose CCB, as thiazide can elevate Caclium.  Steatosis of liver-per USG 12.30 (11/16/2011)   Assessment: USG done 12.30 concurs with steatosis and no organic pathology other than that.  monitor    I have fully updated family as to the course of their family members care and have answered all questions that they had      LOS: 3 days   [facility] 11/17/2011, 8:36 AM

## 2011-11-17 NOTE — Progress Notes (Signed)
11/17/11 1800 Information sheet on Synthroid given and explained to pt. Leandrew Koyanagi Parisa Pinela,RN

## 2011-11-18 ENCOUNTER — Inpatient Hospital Stay (HOSPITAL_COMMUNITY): Payer: BC Managed Care – PPO

## 2011-11-18 LAB — COMPREHENSIVE METABOLIC PANEL
ALT: 107 U/L — ABNORMAL HIGH (ref 0–35)
Albumin: 4 g/dL (ref 3.5–5.2)
Alkaline Phosphatase: 148 U/L — ABNORMAL HIGH (ref 39–117)
BUN: 10 mg/dL (ref 6–23)
Chloride: 101 mEq/L (ref 96–112)
Glucose, Bld: 108 mg/dL — ABNORMAL HIGH (ref 70–99)
Potassium: 3.1 mEq/L — ABNORMAL LOW (ref 3.5–5.1)
Sodium: 136 mEq/L (ref 135–145)
Total Bilirubin: 0.4 mg/dL (ref 0.3–1.2)
Total Protein: 8 g/dL (ref 6.0–8.3)

## 2011-11-18 LAB — CSF CULTURE W GRAM STAIN

## 2011-11-18 LAB — CBC
HCT: 34.2 % — ABNORMAL LOW (ref 36.0–46.0)
Hemoglobin: 11.8 g/dL — ABNORMAL LOW (ref 12.0–15.0)
MCHC: 34.5 g/dL (ref 30.0–36.0)
RDW: 15.2 % (ref 11.5–15.5)
WBC: 5.4 10*3/uL (ref 4.0–10.5)

## 2011-11-18 LAB — DIFFERENTIAL
Basophils Absolute: 0 10*3/uL (ref 0.0–0.1)
Basophils Relative: 1 % (ref 0–1)
Lymphocytes Relative: 50 % — ABNORMAL HIGH (ref 12–46)
Neutro Abs: 2.2 10*3/uL (ref 1.7–7.7)

## 2011-11-18 MED ORDER — INFLUENZA VIRUS VACC SPLIT PF IM SUSP
0.5000 mL | Freq: Once | INTRAMUSCULAR | Status: AC
  Administered 2011-11-18: 0.5 mL via INTRAMUSCULAR
  Filled 2011-11-18: qty 0.5

## 2011-11-18 NOTE — Progress Notes (Signed)
[facility] progress note    Interval h/o:- 61 yr old female with htn/headache, and multiple Ed visits for seemingly disconnected issues including UTI and diverticular disease, presented 12.29 with hypercalcemia and hypokalemia and altered LFT's Was found to be signifcantly hypothryroid, and has antecedent 30 pounds of weight loss as well as hair loss with anxiety and diarrhoea   Subjective: Feels less energetic, but seems happier than before-maybe a little happier than yesterday.  Still has tingling in her arms.  concerend about he rLiver enzymes.  Still has some paresthesias in the head as well. Has seen Dr. Evelena Vang ion the past ([place])-PCP found allegedly a knot/lymph node.  Unclear if this was ever biopsied. Wonders if the tingling could be MS?  Objective: Vital signs in last 24 hours: Temp:  [97.8 F (36.6 C)-98.7 F (37.1 C)] 98.7 F (37.1 C) (01/01 1400) Pulse Rate:  [66-101] 81  (01/01 1400) Resp:  [18-20] 20  (01/01 1400) BP: (106-141)/(71-87) 127/78 mmHg (01/01 1400) SpO2:  [98 %-100 %] 100 % (01/01 1400) Weight change:   Intake/Output Summary (Last 24 hours) at 11/18/11 1717 Last data filed at 11/18/11 1013  Gross per 24 hour  Intake    240 ml  Output      0 ml  Net    240 ml    General appearance: alert and cooperative-seems happier Throat: lips, mucosa, and tongue normal; teeth and gums normal  Neck: no adenopathy, no carotid bruit, no JVD, supple, symmetrical, trachea midline and thyroid not enlarged, symmetric, no tenderness/mass/nodules  Lungs: clear to auscultation bilaterally  Heart: regular rate and rhythm, S1, S2 normal, no murmur, click, rub or gallop  Abdomen: normal.  BP 127/78  Pulse 81  Temp(Src) 98.7 F (37.1 C) (Oral)  Resp 20  Ht 5\' 7"  (1.702 m)  Wt 74.345 kg (163 lb 14.4 oz)  BMI 25.67 kg/m2  SpO2 100%  Lab Results: No results found for this basename:  NA:2,K:2,CL:2,CO2:2,GLUCOSE:2,BUN:2,CREATININE:2,CALCIUM:2,MG:2,PHOS:2 in the last 72 hours No results found for this basename: AST:2,ALT:2,ALKPHOS:2,BILITOT:2,PROT:2,ALBUMIN:2 in the last 72 hours No results found for this basename: LIPASE:2,AMYLASE:2 in the last 72 hours No results found for this basename: WBC:2,NEUTROABS:2,HGB:2,HCT:2,MCV:2,PLT:2 in the last 72 hours No results found for this basename: CKTOTAL:3,CKMB:3,CKMBINDEX:3,TROPONINI:3 in the last 72 hours No components found with this basename: POCBNP:3 No results found for this basename: DDIMER:2 in the last 72 hours No results found for this basename: HGBA1C:2 in the last 72 hours No results found for this basename: CHOL:2,HDL:2,LDLCALC:2,TRIG:2,CHOLHDL:2,LDLDIRECT:2 in the last 72 hours  Basename 11/16/11 1357 11/15/11 1756  TSH -- 0.265*  T4TOTAL -- --  T3FREE 2.7 --  THYROIDAB -- --   No results found for this basename: VITAMINB12:2,FOLATE:2,FERRITIN:2,TIBC:2,IRON:2,RETICCTPCT:2 in the last 72 hours Micro Results: Recent Results (from the past 240 hour(s))  URINE CULTURE     Status: Normal   Collection Time   11/11/11 11:13 PM      Component Value Range Status Comment   Specimen Description URINE, CLEAN CATCH   Final    Special Requests NONE   Final    Setup Time [id number]   Final    Colony Count 1,000 COLONIES/ML   Final    Culture INSIGNIFICANT GROWTH   Final    Report Status 11/13/2011 FINAL   Final   GRAM STAIN     Status: Normal   Collection Time   11/14/11  7:15 PM      Component Value Range Status Comment   Specimen Description CSF  Final    Special Requests NONE   Final    Gram Stain     Final    Value: WBC PRESENT, PREDOMINANTLY MONONUCLEAR     NO ORGANISMS SEEN     CYTOSPIN   Report Status 11/14/2011 FINAL   Final   CSF CULTURE     Status: Normal   Collection Time   11/14/11  7:24 PM      Component Value Range Status Comment   Specimen Description CSF   Final    Special Requests  Immunocompromised   Final    Gram Stain     Final    Value: CYTPSPIN WBC PRESENT, PREDOMINANTLY MONONUCLEAR     NO ORGANISMS SEEN     Performed at [facility]   Culture NO GROWTH 3 DAYS   Final    Report Status 11/18/2011 FINAL   Final     Medications: I have reviewed the patient's current medications. Scheduled Meds:    . enoxaparin  40 mg Subcutaneous Daily  . influenza  inactive virus vaccine  0.5 mL Intramuscular Once  . levothyroxine  50 mcg Oral QAC breakfast  . magic mouthwash  2 mL Oral BID  . pantoprazole  40 mg Oral Q1200  . potassium chloride  20 mEq Oral BID   Continuous Infusions:   PRN Meds:.acetaminophen, acetaminophen, ondansetron (ZOFRAN) IV, ondansetron, oxyCODONE, zolpidem   Assessment/Plan: Patient Active Hospital Problem List: Hyperthyroidism without goitre-TSH 0.22 (11/16/2011)   Assessment: TSH 0.22, T4 1.2, T3 2.7 Will ultrasound neck after discussion with Radiologist, as would want to image PAathyroids as well.  Concern for MNG? Will start thyroxine and see if her symptoms resolve-USG thyroid showed one small focal area-styarte dthyroxine  Review of her labs from 12.28 shows also an elevated protein level-will rpt CMET/CBC-if this is a recurrent trend, wil need to consult Onc given her h/o Neutropenia  Will order Ana as well to rule out rheumatologic illness  Hypercalcemia (11/15/2011)   Assessment: received one dose of IV pamidronate.  Will d/c IVF-herCalcium level is below 11, which corresponds more with either medication/non-malignancy realted Hypercalcemia>>Malignancy.   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 Lindie Spruce, who recommended out-pt follow-up   Hypokalemia (11/15/2011)   Assessment: Resolved on repletioon  Headache (11/15/2011)   Assessment: Better  HTN (hypertension) (11/15/2011)   Assessment: moderately controlled.  Would start low dose CCB, as thiazide can elevate  Caclium.  Steatosis of liver-per USG 12.30 (11/16/2011)   Assessment: USG done 12.30 concurs with steatosis and no organic pathology other than that.  monitor    I have fully updated family as to the course of their family members care and have answered all questions that they had      LOS: 4 days   [facility] 11/18/2011, 5:17 PM

## 2011-11-19 LAB — CBC
HCT: 31.7 % — ABNORMAL LOW (ref 36.0–46.0)
Hemoglobin: 10.7 g/dL — ABNORMAL LOW (ref 12.0–15.0)
MCHC: 33.8 g/dL (ref 30.0–36.0)
RBC: 3.9 MIL/uL (ref 3.87–5.11)
WBC: 4.1 10*3/uL (ref 4.0–10.5)

## 2011-11-19 LAB — COMPREHENSIVE METABOLIC PANEL
BUN: 10 mg/dL (ref 6–23)
CO2: 23 mEq/L (ref 19–32)
Calcium: 9.1 mg/dL (ref 8.4–10.5)
Creatinine, Ser: 0.77 mg/dL (ref 0.50–1.10)
GFR calc Af Amer: >90 mL/min (ref >90)
GFR calc non Af Amer: 89 mL/min — ABNORMAL LOW (ref >90)
Glucose, Bld: 104 mg/dL — ABNORMAL HIGH (ref 70–99)
Sodium: 135 mEq/L (ref 135–145)
Total Protein: 6.8 g/dL (ref 6.0–8.3)

## 2011-11-19 NOTE — Discharge Summary (Signed)
PATIENT DETAILS Name: Christina Vang Age: 61 y.o. Sex: female Date of Birth: Jan 03, 1951 MRN: [id number]. Admit Date: 11/14/2011 Admitting Physician: Ron Parker, MD PCP:No primary provider on file.  PRIMARY DISCHARGE DIAGNOSIS:  Principal Problem:  Hypercalcemia Active Problems: Subclinical Hyperthyroidism  Hypokalemia  Headache  HTN (hypertension)  Steatosis of liver-per USG 12.30 Fibromyalgia Recent history of diverticulitis H/O Neutropenia     PAST MEDICAL HISTORY: Past Medical History  Diagnosis Date  . Arthritis   . Fibromyalgia   . Mitral valve prolapse   . Hypertension     DISCHARGE MEDICATIONS: Discharge Medication List as of 11/19/2011 12:52 PM    CONTINUE these medications which have NOT CHANGED   Details  amLODipine (NORVASC) 10 MG tablet Take 10 mg by mouth daily.  , Until Discontinued, Historical Med    aspirin EC 81 MG tablet Take 81 mg by mouth daily.  , Until Discontinued, Historical Med    cyclobenzaprine (FLEXERIL) 10 MG tablet Take 10 mg by mouth 3 (three) times daily.  , Until Discontinued, Historical Med    DULoxetine (CYMBALTA) 60 MG capsule Take 120 mg by mouth daily.  , Until Discontinued, Historical Med    fexofenadine (ALLEGRA) 180 MG tablet Take 180 mg by mouth daily as needed. For allergies , Until Discontinued, Historical Med    Multiple Vitamin (MULITIVITAMIN WITH MINERALS) TABS Take 1 tablet by mouth daily.  , Until Discontinued, Historical Med    potassium chloride SA (K-DUR,KLOR-CON) 20 MEQ tablet Take 20 mEq by mouth 2 (two) times daily.  , Until Discontinued, Historical Med    promethazine (PHENERGAN) 25 MG tablet Take 25 mg by mouth every 6 (six) hours as needed. For nausea  , Until Discontinued, Historical Med    traMADol (ULTRAM) 50 MG tablet Take 50 mg by mouth 3 (three) times daily. Maximum dose= 8 tablets per day , Until Discontinued, Historical Med    Vitamin D, Ergocalciferol, (DRISDOL) 50000 UNITS CAPS Take  50,000 Units by mouth every 7 (seven) days. Take on Friday , Until Discontinued, Historical Med    zolpidem (AMBIEN) 10 MG tablet Take 10 mg by mouth at bedtime.  , Until Discontinued, Historical Med      STOP taking these medications     cephALEXin (KEFLEX) 500 MG capsule      ciprofloxacin (CIPRO) 500 MG tablet      lisinopril (PRINIVIL,ZESTRIL) 40 MG tablet      metroNIDAZOLE (FLAGYL) 500 MG tablet          BRIEF HPI:  See H&P, Labs, Consult and Test reports for all details in brief, patient was admitted for headache tingling and numbness. For further details please see the history and physical done on admission.  CONSULTATIONS:   none  PERTINENT RADIOLOGIC STUDIES: Dg Chest 2 View  11/11/2011  *RADIOLOGY REPORT*  Clinical Data: Chest pain, radiating to the mid left back. Tingling down both arms and dizziness.  CHEST - 2 VIEW  Comparison: Chest radiograph performed 03/06/2010  Findings: The lungs are well-aerated and clear.  There is no evidence of focal opacification, pleural effusion or pneumothorax.  The heart is normal in size; the mediastinal contour is within normal limits.  No acute osseous abnormalities are seen.  Scattered clips are noted at the upper abdomen.  IMPRESSION: No acute cardiopulmonary process seen.  Original Report Authenticated By: Tonia Ghent, M.D.   Ct Head Wo Contrast  11/14/2011  *RADIOLOGY REPORT*  Clinical Data: Headache.  Nausea.  Dizziness.  CT HEAD  WITHOUT CONTRAST  Technique:  Contiguous axial images were obtained from the base of the skull through the vertex without contrast.  Comparison: None.  Findings: A subtle 5 x 3 mm hypodensity along the anterior limb of the right internal capsule is shown on image 14 of series 2, potentially artifact or a tiny remote lacunar infarct.  The brain stem, cerebellum, cerebral peduncles, thalami, basal ganglia, basilar cisterns, and ventricular system appear unremarkable.  No intracranial hemorrhage, mass  lesion, or acute infarction is identified.  The visualized paranasal sinuses appear clear.  IMPRESSION:  1.  No acute intracranial findings. 2.  Some faint hypodensity in the anterior limb of the right internal capsule may be artifact or due to a tiny remote lacunar infarct.  Original Report Authenticated By: Dellia Cloud, M.D.   US Soft Tissue Head/neck  11/18/2011  *RADIOLOGY REPORT*  Clinical Data: Hyperthyroidism.  The patient began thyroid medication today.  THYROID ULTRASOUND  Technique: Ultrasound examination of the thyroid gland and adjacent soft tissues was performed.  Comparison:  None.  Findings:  Right thyroid lobe:  The right lobe is without focal lesion and measures 4.0 x 1.7 x 1.5 cm. Left thyroid lobe:  The left lobe is 3.7 x 1.3 x 1.8 cm.  No focal lesion. Isthmus:  0.29 cm  Focal nodules:  Small isthmic nodule measures 3 x 2 x 3 mm.  Lymphadenopathy:  None visualized.  IMPRESSION:  1.  Relatively homogeneous glands are. 2.  Small isthmic nodule measures 3 mm maximum diameter.  Original Report Authenticated By: Beckmann Hammersmith, M.D.   US Abdomen Complete  11/16/2011  *RADIOLOGY REPORT*  Clinical Data:  Elevated liver function tests.  Nausea.  Previous cholecystectomy.  COMPLETE ABDOMINAL ULTRASOUND  Comparison:  CT scan dated 11/24/2003 and ultrasound report dated 11/24/2003  Findings:  Gallbladder:  Removed.  Common bile duct:  Normal.  4.1 mm in diameter.  Liver:  No focal lesions.  Echogenic liver parenchyma suggesting hepatic steatosis.  Benign appearing 2.5 cm cyst in the left lobe as demonstrated on prior CT scan.  IVC:  Appears normal.  Pancreas:  The pancreas is not well seen.  Spleen:  Normal.  3.7 cm in length.  Right Kidney:  12.4 cm in length.  Slight prominence of the right renal pelvis.  Left Kidney:  11.3 cm in length.  Slight prominence of the renal pelvis.  Abdominal aorta:  Normal.  2.0 cm in diameter.  IMPRESSION: Echogenic liver parenchyma suggestive of hepatic  steatosis. Pancreas is not well seen.  Slight prominence of the renal pelves bilaterally, nonspecific.  Original Report Authenticated By: Gwynn Burly, M.D.     PERTINENT LAB RESULTS: CBC:  Basename 11/19/11 0507 11/18/11 1725  WBC 4.1 5.4  HGB 10.7* 11.8*  HCT 31.7* 34.2*  PLT 270 311   CMET CMP     Component Value Date/Time   NA 135 11/19/2011 0507   K 3.0* 11/19/2011 0507   CL 103 11/19/2011 0507   CO2 23 11/19/2011 0507   GLUCOSE 104* 11/19/2011 0507   BUN 10 11/19/2011 0507   CREATININE 0.77 11/19/2011 0507   CALCIUM 9.1 11/19/2011 0507   PROT 6.8 11/19/2011 0507   ALBUMIN 3.5 11/19/2011 0507   AST 19 11/19/2011 0507   ALT 86* 11/19/2011 0507   ALKPHOS 126* 11/19/2011 0507   BILITOT 0.2* 11/19/2011 0507   GFRNONAA 89* 11/19/2011 0507   GFRAA >90 11/19/2011 0507    GFR Estimated Creatinine Clearance: 78.7 ml/min (by  C-G formula based on Cr of 0.77). No results found for this basename: LIPASE:2,AMYLASE:2 in the last 72 hours No results found for this basename: CKTOTAL:3,CKMB:3,CKMBINDEX:3,TROPONINI:3 in the last 72 hours No components found with this basename: POCBNP:3 No results found for this basename: DDIMER:2 in the last 72 hours No results found for this basename: HGBA1C:2 in the last 72 hours No results found for this basename: CHOL:2,HDL:2,LDLCALC:2,TRIG:2,CHOLHDL:2,LDLDIRECT:2 in the last 72 hours No results found for this basename: TSH,T4TOTAL,FREET3,T3FREE,THYROIDAB in the last 72 hours No results found for this basename: VITAMINB12:2,FOLATE:2,FERRITIN:2,TIBC:2,IRON:2,RETICCTPCT:2 in the last 72 hours Coags: No results found for this basename: PT:2,INR:2 in the last 72 hours Microbiology: Recent Results (from the past 240 hour(s))  URINE CULTURE     Status: Normal   Collection Time   11/11/11 11:13 PM      Component Value Range Status Comment   Specimen Description URINE, CLEAN CATCH   Final    Special Requests NONE   Final    Setup Time [id number]   Final    Colony Count  1,000 COLONIES/ML   Final    Culture INSIGNIFICANT GROWTH   Final    Report Status 11/13/2011 FINAL   Final   GRAM STAIN     Status: Normal   Collection Time   11/14/11  7:15 PM      Component Value Range Status Comment   Specimen Description CSF   Final    Special Requests NONE   Final    Gram Stain     Final    Value: WBC PRESENT, PREDOMINANTLY MONONUCLEAR     NO ORGANISMS SEEN     CYTOSPIN   Report Status 11/14/2011 FINAL   Final   CSF CULTURE     Status: Normal   Collection Time   11/14/11  7:24 PM      Component Value Range Status Comment   Specimen Description CSF   Final    Special Requests Immunocompromised   Final    Gram Stain     Final    Value: CYTPSPIN WBC PRESENT, PREDOMINANTLY MONONUCLEAR     NO ORGANISMS SEEN     Performed at [facility]   Culture NO GROWTH 3 DAYS   Final    Report Status 11/18/2011 FINAL   Final      BRIEF HOSPITAL COURSE:    Principal Problem:  Hypercalcemia -This was of uncertain etiology. Patient was hydrated and received one dose of pamidronate. -Following this her calcium levels have now normalized. Her PTH. levels are slightly on the higher side. -I've spoken with her primary care practitioner at [facility] in [place]-I've spoken with Roxanne Mins [facility] over there, I have discussed this case with him in detail, apparently this patient has had high normal calcium levels for the past few months. Further workup regarding this will be done as an outpatient. Patient indicates to me that she has a followup appointment with her primary care practitioner on January 4, she has been encouraged to keep this appointment. She probably will need a endocrinology evaluation, however we will leave that to the discretion of the patient's primary care practitioner.  Active Problems: Subclinical Hyperthyroidism -She does have unintentional weight loss and some hair loss. However her free T4 levels are normal. TSH level is suppressed. A  thyroid ultrasound showed a small nodule in the isthmus, however it did not show any goiter. -Further workup regarding this will be done as an outpatient.    Headache -Patient had  a lumbar puncture done at med center [facility].  WBC was 2 in total protein was 44. Her headache has now resolved at the time of discharge.   HTN (hypertension) -Patient will continue amlodipine on discharge. Her blood pressure has been relatively well controlled without the need for further blood pressure medications here. She will followup with her primary care practitioner on 11/21/2011 and will then see if she needs to be restarted on lisinopril as well.   Steatosis of liver-per USG 12.30 -Further workup to be done as an outpatient.  Fibromyalgia -Stable at discharge.   Recent history of diverticulitis -A. she has no abdominal pain on palpation, she is now tolerating a regular diet. She has almost finished a 2 week course of antibiotics. Therefore she is not discharged on any antibiotics.   H/O Neutropenia -Patient sees a hematologist as an outpatient, she has been asked to call and make an appointment For further continued care.   TODAY-DAY OF DISCHARGE:  Subjective:   Christina Vang today has no headache,no chest abdominal pain,no new weakness tingling or numbness, feels much better wants to go home today.   Objective:   Blood pressure 122/79, pulse 83, temperature 98.4 F (36.9 C), temperature source Oral, resp. rate 18, height 5\' 7"  (1.702 m), weight 74.345 kg (163 lb 14.4 oz), SpO2 100.00%.  Intake/Output Summary (Last 24 hours) at 11/19/11 1858 Last data filed at 11/19/11 0900  Gross per 24 hour  Intake    240 ml  Output      0 ml  Net    240 ml    Exam Awake Alert, Oriented *3, No new F.N deficits, Normal affect [place].AT,PERRAL Supple Neck,No JVD, No cervical lymphadenopathy appriciated.  Symmetrical Chest wall movement, Good air movement bilaterally, CTAB RRR,No Gallops,Rubs or new  Murmurs, No Parasternal Heave +ve B.Sounds, Abd Soft, Non tender, No organomegaly appriciated, No rebound -guarding or rigidity. No Cyanosis, Clubbing or edema, No new Rash or bruise  DISPOSITION: Home  DISCHARGE INSTRUCTIONS:    Follow-up Information    Follow up with DURAN,MICHAEL R, PA. (has a previously scheduled appointment with PCP on 11/21/11)    Contact information:   [facility]  [address] [place] [address] [phone]          Total Time spent on discharge equals 45 minutes.  SignedJeoffrey Massed 11/19/2011 6:58 PM

## 2011-11-19 NOTE — Progress Notes (Signed)
Patient discharged to home with family, discharged instructions given and reviewed with patient.  Patient verbalized understanding. Skin intact at discharge. Patient escorted to car via wheelchair by volunteer service. Forbes Cellar, RN

## 2011-11-20 LAB — ANA: Anti Nuclear Antibody(ANA): NEGATIVE

## 2012-03-16 ENCOUNTER — Emergency Department (HOSPITAL_COMMUNITY): Payer: BC Managed Care – PPO

## 2012-03-16 ENCOUNTER — Encounter (HOSPITAL_COMMUNITY): Payer: Self-pay | Admitting: *Deleted

## 2012-03-16 ENCOUNTER — Emergency Department (HOSPITAL_COMMUNITY)
Admission: EM | Admit: 2012-03-16 | Discharge: 2012-03-17 | Disposition: A | Discharge status: Home / Self Care | Payer: BC Managed Care – PPO | Attending: Emergency Medicine | Admitting: Emergency Medicine

## 2012-03-16 DIAGNOSIS — R109 Unspecified abdominal pain: Secondary | ICD-10-CM | POA: Insufficient documentation

## 2012-03-16 DIAGNOSIS — I10 Essential (primary) hypertension: Secondary | ICD-10-CM | POA: Insufficient documentation

## 2012-03-16 DIAGNOSIS — R142 Eructation: Secondary | ICD-10-CM | POA: Insufficient documentation

## 2012-03-16 DIAGNOSIS — K5732 Diverticulitis of large intestine without perforation or abscess without bleeding: Secondary | ICD-10-CM | POA: Insufficient documentation

## 2012-03-16 DIAGNOSIS — Z7982 Long term (current) use of aspirin: Secondary | ICD-10-CM | POA: Insufficient documentation

## 2012-03-16 DIAGNOSIS — M129 Arthropathy, unspecified: Secondary | ICD-10-CM | POA: Insufficient documentation

## 2012-03-16 DIAGNOSIS — M549 Dorsalgia, unspecified: Secondary | ICD-10-CM | POA: Insufficient documentation

## 2012-03-16 DIAGNOSIS — Z79899 Other long term (current) drug therapy: Secondary | ICD-10-CM | POA: Insufficient documentation

## 2012-03-16 DIAGNOSIS — R110 Nausea: Secondary | ICD-10-CM | POA: Insufficient documentation

## 2012-03-16 DIAGNOSIS — R141 Gas pain: Secondary | ICD-10-CM | POA: Insufficient documentation

## 2012-03-16 LAB — DIFFERENTIAL
Eosinophils Absolute: 0.2 10*3/uL (ref 0.0–0.7)
Eosinophils Relative: 2 % (ref 0–5)
Lymphs Abs: 2 10*3/uL (ref 0.7–4.0)
Monocytes Absolute: 0.4 10*3/uL (ref 0.1–1.0)

## 2012-03-16 LAB — URINE MICROSCOPIC-ADD ON

## 2012-03-16 LAB — URINALYSIS, ROUTINE W REFLEX MICROSCOPIC
Leukocytes, UA: NEGATIVE
Protein, ur: NEGATIVE mg/dL
Urobilinogen, UA: 0.2 mg/dL (ref 0.0–1.0)

## 2012-03-16 LAB — POCT I-STAT, CHEM 8
Calcium, Ion: 1.24 mmol/L (ref 1.12–1.32)
Creatinine, Ser: 1 mg/dL (ref 0.50–1.10)
Glucose, Bld: 107 mg/dL — ABNORMAL HIGH (ref 70–99)
Hemoglobin: 12.6 g/dL (ref 12.0–15.0)
Sodium: 139 mEq/L (ref 135–145)
TCO2: 28 mmol/L (ref 0–100)

## 2012-03-16 LAB — CBC
HCT: 34.8 % — ABNORMAL LOW (ref 36.0–46.0)
MCH: 28.9 pg (ref 26.0–34.0)
MCV: 83.3 fL (ref 78.0–100.0)
Platelets: 284 10*3/uL (ref 150–400)
RBC: 4.18 MIL/uL (ref 3.87–5.11)
RDW: 14.1 % (ref 11.5–15.5)

## 2012-03-16 MED ORDER — CIPROFLOXACIN IN D5W 400 MG/200ML IV SOLN
400.0000 mg | Freq: Once | INTRAVENOUS | Status: AC
  Administered 2012-03-17: 400 mg via INTRAVENOUS
  Filled 2012-03-16: qty 200

## 2012-03-16 MED ORDER — IOHEXOL 300 MG/ML  SOLN
100.0000 mL | Freq: Once | INTRAMUSCULAR | Status: AC | PRN
  Administered 2012-03-16: 100 mL via INTRAVENOUS

## 2012-03-16 MED ORDER — MORPHINE SULFATE 4 MG/ML IJ SOLN
4.0000 mg | Freq: Once | INTRAMUSCULAR | Status: AC
  Administered 2012-03-17: 4 mg via INTRAVENOUS
  Filled 2012-03-16: qty 1

## 2012-03-16 MED ORDER — METRONIDAZOLE IN NACL 5-0.79 MG/ML-% IV SOLN
500.0000 mg | Freq: Once | INTRAVENOUS | Status: AC
  Administered 2012-03-17: 500 mg via INTRAVENOUS
  Filled 2012-03-16: qty 100

## 2012-03-16 MED ORDER — ONDANSETRON HCL 4 MG/2ML IJ SOLN
4.0000 mg | Freq: Once | INTRAMUSCULAR | Status: AC
  Administered 2012-03-17: 4 mg via INTRAVENOUS
  Filled 2012-03-16: qty 2

## 2012-03-16 MED ORDER — IOHEXOL 300 MG/ML  SOLN
40.0000 mL | Freq: Once | INTRAMUSCULAR | Status: AC | PRN
  Administered 2012-03-16: 40 mL via ORAL

## 2012-03-16 MED ORDER — PANTOPRAZOLE SODIUM 40 MG IV SOLR
40.0000 mg | Freq: Once | INTRAVENOUS | Status: AC
  Administered 2012-03-17: 40 mg via INTRAVENOUS
  Filled 2012-03-16: qty 40

## 2012-03-16 MED ORDER — ONDANSETRON HCL 4 MG/2ML IJ SOLN
4.0000 mg | Freq: Once | INTRAMUSCULAR | Status: AC
  Administered 2012-03-16: 4 mg via INTRAVENOUS
  Filled 2012-03-16: qty 2

## 2012-03-16 MED ORDER — HYDROMORPHONE HCL PF 1 MG/ML IJ SOLN
1.0000 mg | Freq: Once | INTRAMUSCULAR | Status: AC
  Administered 2012-03-16: 1 mg via INTRAVENOUS
  Filled 2012-03-16: qty 1

## 2012-03-16 MED ORDER — SODIUM CHLORIDE 0.9 % IV SOLN
Freq: Once | INTRAVENOUS | Status: AC
  Administered 2012-03-16: 21:00:00 via INTRAVENOUS

## 2012-03-16 MED ORDER — MORPHINE SULFATE 4 MG/ML IJ SOLN
4.0000 mg | Freq: Once | INTRAMUSCULAR | Status: AC
  Administered 2012-03-16: 4 mg via INTRAVENOUS
  Filled 2012-03-16 (×2): qty 1

## 2012-03-16 NOTE — ED Notes (Signed)
CT at bedside to delivered contrast/water cups.

## 2012-03-16 NOTE — ED Notes (Signed)
Pt reports LLQ pain that started yesterday.  LBM was yesterday-normal.  Denies nausea and vomiting.  Pain is constant, dull ache.  Pt tender on palpation.  Reports dysuria.

## 2012-03-16 NOTE — ED Notes (Signed)
Patient complaining of left lower quadrant abdominal pain that started yesterday evening; patient reports nausea yesterday, but denies nausea and vomiting today.  Patient describes pain as "aching", "throbbing", and "deep"; patient reports radiation of pain around her left side and into her left flank area.  Patient denies constipation and diarrhea; last bowel movement last night (reports normal bowel movement).  Patient did a 24 hour urine test over the weekend for her endocrinologist, but denies any changes in her urine.  Also denies any vaginal changes.  Patient alert and oriented x4; PERRL present.  Upon arrival to room, patient changed into gown.  Will continue to monitor.

## 2012-03-16 NOTE — ED Notes (Signed)
Gail, FNP at bedside. 

## 2012-03-16 NOTE — ED Provider Notes (Signed)
History     CSN: [id number]  Arrival date & time 03/16/12  1753   First MD Initiated Contact with Patient 03/16/12 2005      Chief Complaint  Patient presents with  . Abdominal Pain    (Consider location/radiation/quality/duration/timing/severity/associated sxs/prior treatment) Patient is a 61 y.o. female presenting with abdominal pain. The history is provided by the patient.  Abdominal Pain The primary symptoms of the illness include abdominal pain and nausea. The primary symptoms of the illness do not include fever, vomiting or diarrhea. The current episode started 13 to 24 hours ago. The onset of the illness was gradual. The problem has not changed since onset. The patient states that she believes she is currently not pregnant. The patient has not had a change in bowel habit. Additional symptoms associated with the illness include back pain. Symptoms associated with the illness do not include constipation or frequency. Significant associated medical issues include inflammatory bowel disease.    Past Medical History  Diagnosis Date  . Arthritis   . Fibromyalgia   . Mitral valve prolapse   . Hypertension   . Hyperparathyroidism     Past Surgical History  Procedure Date  . Abdominal hysterectomy   . Nissan fundiplication   . Knee surgery   . Cholecystectomy   . Liver biopsy     Family History  Problem Relation Age of Onset  . Hypertension Mother   . Hypertension Father   . Stroke Mother   . Stroke Father   . Diabetes Mother   . Diabetes Sister   . Diabetes Brother     History  Substance Use Topics  . Smoking status: Never Smoker   . Smokeless tobacco: Never Used  . Alcohol Use: No    OB History    Grav Para Term Preterm Abortions TAB SAB Ect Mult Living                  Review of Systems  Constitutional: Negative for fever.  Gastrointestinal: Positive for nausea, abdominal pain and abdominal distention. Negative for vomiting, diarrhea and constipation.   Genitourinary: Negative for frequency.  Musculoskeletal: Positive for back pain.  Neurological: Negative for dizziness and weakness.    Allergies  Augmentin; Codeine; and Tetracyclines & related  Home Medications   Current Outpatient Rx  Name Route Sig Dispense Refill  . AMLODIPINE BESYLATE 10 MG PO TABS Oral Take 10 mg by mouth daily.      . ASPIRIN EC 81 MG PO TBEC Oral Take 81 mg by mouth daily.      . CYCLOBENZAPRINE HCL 10 MG PO TABS Oral Take 10 mg by mouth 3 (three) times daily.      . DULOXETINE HCL 60 MG PO CPEP Oral Take 120 mg by mouth daily.      Marland Kitchen FEXOFENADINE HCL 180 MG PO TABS Oral Take 180 mg by mouth daily as needed. For allergies     . ADULT MULTIVITAMIN W/MINERALS CH Oral Take 1 tablet by mouth daily.      Marland Kitchen POTASSIUM CHLORIDE CRYS ER 20 MEQ PO TBCR Oral Take 40 mEq by mouth 2 (two) times daily.     Marland Kitchen PROMETHAZINE HCL 25 MG PO TABS Oral Take 25 mg by mouth every 6 (six) hours as needed. For nausea      . TRAMADOL HCL 50 MG PO TABS Oral Take 50 mg by mouth 3 (three) times daily. Maximum dose= 8 tablets per day     . VITAMIN D (  ERGOCALCIFEROL) 50000 UNITS PO CAPS Oral Take 50,000 Units by mouth every 7 (seven) days. Take on Friday     . ZOLPIDEM TARTRATE 10 MG PO TABS Oral Take 10 mg by mouth at bedtime.      Marland Kitchen CIPROFLOXACIN HCL 500 MG PO TABS Oral Take 1 tablet (500 mg total) by mouth 2 (two) times daily. 20 tablet 0  . METRONIDAZOLE 500 MG PO TABS Oral Take 1 tablet (500 mg total) by mouth 2 (two) times daily. 14 tablet 0  . OXYCODONE-ACETAMINOPHEN 5-325 MG PO TABS Oral Take 1-2 tablets by mouth every 6 (six) hours as needed for pain. 20 tablet 0    BP 159/87  Pulse 93  Temp(Src) 98.7 F (37.1 C) (Oral)  Resp 18  SpO2 99%  Physical Exam  Constitutional: She appears well-developed and well-nourished.  HENT:  Head: Normocephalic.  Eyes: Pupils are equal, round, and reactive to light.  Neck: Normal range of motion.  Cardiovascular: Normal rate.     Abdominal: Soft. Bowel sounds are normal. She exhibits distension. There is tenderness in the suprapubic area and left lower quadrant.  Musculoskeletal: Normal range of motion.  Neurological: She is alert.  Skin: Skin is warm.    ED Course  Procedures (including critical care time)  Labs Reviewed  URINALYSIS, ROUTINE W REFLEX MICROSCOPIC - Abnormal; Notable for the following:    APPearance CLOUDY (*)    Hgb urine dipstick SMALL (*)    All other components within normal limits  URINE MICROSCOPIC-ADD ON - Abnormal; Notable for the following:    Bacteria, UA FEW (*)    All other components within normal limits  CBC - Abnormal; Notable for the following:    HCT 34.8 (*)    All other components within normal limits  POCT I-STAT, CHEM 8 - Abnormal; Notable for the following:    Potassium 3.3 (*)    Glucose, Bld 107 (*)    All other components within normal limits  DIFFERENTIAL   Ct Abdomen Pelvis W Contrast  03/16/2012  *RADIOLOGY REPORT*  Clinical Data: Left lower quadrant abdominal pain  CT ABDOMEN AND PELVIS WITH CONTRAST  Technique:  Multidetector CT imaging of the abdomen and pelvis was performed following the standard protocol during bolus administration of intravenous contrast.  Contrast: 40mL OMNIPAQUE IOHEXOL 300 MG/ML  SOLN, OMNIPAQUE IOHEXOL 300 MG/ML  SOLN  Comparison: 11/24/2003  Findings: Limited images through the lung bases demonstrate no significant appreciable abnormality. The heart size is within normal limits. No pleural or pericardial effusion.  Mild intra and extrahepatic biliary ductal prominence to the level of the ampulla.  No obstructing lesion is identified.  There are numerous scattered hypodensities throughout the liver, nonspecific however without significant interval change from 2005. Unremarkable spleen, pancreas, adrenal glands.  There are surgical clips in the gallbladder fossa status post cholecystectomy and at the GE junction and adjacent to the  stomach, similar to prior.  Lobular renal contours with area of scarring involving the upper pole on the left. 1.2 cm hypodensity within the interpolar right kidney anteromedially is incompletely characterized.  No hydronephrosis or hydroureter.  Colonic diverticulosis with sigmoid colon diverticulitis.  Pericolonic fat stranding in this region however no loculated fluid collection to suggest abscess.  No free intraperitoneal air.  No bowel obstruction.  Thin-walled bladder.  Absent uterus.  No adnexal mass.  Normal caliber vasculature.  Grade 1 anterolisthesis of L5 on S1. No acute osseous abnormality identified.  IMPRESSION: Sigmoid colon diverticulitis. If not  yet performed, age appropriate screening colonoscopy recommended when symptoms resolve to exclude underlying lesion.  Indeterminate hypodense lesion within the right kidney may reflect a hemorrhagic/proteinaceous cyst however a follow-up ultrasound recommended for further confirmation.  Original Report Authenticated By: Waneta Martins, M.D.     1. Diverticulitis of sigmoid colon       MDM  Should the history of diverticulitis last admitted to the hospital in January followed by a physician in [facility].  Now with left lower quadrant pain, distention, stating, "I.  Abdomen has never hurt like this before."        Arman Filter, NP 03/17/12 [phone]

## 2012-03-16 NOTE — ED Notes (Addendum)
Patient ambulated to restroom; no difficulties.  Patient states that the pain medication gave her indigestion; requesting GI Cocktail; Dondra Spry, FNP notified.  Patient states that the pain medication "took the edge off", but that then pain is "still there".  Will continue to monitor.

## 2012-03-16 NOTE — ED Notes (Signed)
Patient currently sitting up in bed; no respiratory or acute distress noted.  Patient finished drinking contrast; CT called and notified.  Will continue to monitor.

## 2012-03-16 NOTE — ED Notes (Addendum)
Patient currently sitting up in bed; no respiratory or acute distress noted.  Patient updated on plan of care; informed patient that lab results are back and that we are currently waiting on FNP to come and talk about test results.  Patient states that her pain has gotten worse after pain medication administration; FNP notified.  Patient has no other questions or concerns at this time; will continue to monitor.

## 2012-03-17 MED ORDER — OXYCODONE-ACETAMINOPHEN 5-325 MG PO TABS
1.0000 | ORAL_TABLET | Freq: Once | ORAL | Status: AC
  Administered 2012-03-17: 1 via ORAL
  Filled 2012-03-17: qty 1

## 2012-03-17 MED ORDER — CIPROFLOXACIN HCL 500 MG PO TABS: 500.0000 mg | ORAL_TABLET | Freq: Two times a day (BID) | ORAL | Status: AC

## 2012-03-17 MED ORDER — OXYCODONE-ACETAMINOPHEN 5-325 MG PO TABS: 1.0000 | ORAL_TABLET | Freq: Four times a day (QID) | ORAL | Status: AC | PRN

## 2012-03-17 MED ORDER — GI COCKTAIL ~~LOC~~
30.0000 mL | Freq: Once | ORAL | Status: AC
  Administered 2012-03-17: 30 mL via ORAL
  Filled 2012-03-17: qty 30

## 2012-03-17 MED ORDER — METRONIDAZOLE 500 MG PO TABS: 500.0000 mg | ORAL_TABLET | Freq: Two times a day (BID) | ORAL | Status: AC

## 2012-03-17 NOTE — ED Provider Notes (Signed)
Medical screening examination/treatment/procedure(s) were performed by non-physician practitioner and as supervising physician I was immediately available for consultation/collaboration.   Soua Lenk, MD 03/17/12 1455 

## 2012-03-17 NOTE — Discharge Instructions (Signed)
Diverticulitis Small pockets or "bubbles" can develop in the wall of the intestine. Diverticulitis is when those pockets become infected and inflamed. This causes stomach pain (usually on the left side). HOME CARE  Take all medicine as told by your doctor.   Try a clear liquid diet (broth, tea, or water) for as long as told by your doctor.   Keep all follow-up visits with your doctor.   You may be put on a low-fiber diet once you start feeling better. Here are foods that have low-fiber:   White breads, cereals, rice, and pasta.   Cooked fruits and vegetables or soft fresh fruits and vegetables without the skin.   Ground or well-cooked tender beef, ham, veal, lamb, pork, or poultry.   Eggs and seafood.   After you are doing well on the low-fiber diet, you may be put on a high-fiber diet. Here are ways to increase your fiber:   Choose whole-grain breads, cereals, pasta, and brown rice.   Choose fruits and vegetables with skin on. Do not overcook the vegetables.   Choose nuts, seeds, legumes, dried peas, beans, and lentils.   Look for food products that have more than 3 grams of fiber per serving on the food label.  GET HELP RIGHT AWAY IF:  Your pain does not get better or gets worse.   You have trouble eating food.   You are not pooping (having bowel movements) like normal.   You have a temperature by mouth above 102 F (38.9 C), not controlled by medicine.   You keep throwing up (vomiting).   You have bloody or black, tarry poop (stools).   You are getting worse and not better.  MAKE SURE YOU:   Understand these instructions.   Will watch your condition.   Will get help right away if you are not doing well or get worse.  Document Released: 04/21/2008 Document Revised: 10/23/2011 Document Reviewed: 09/24/2009 [facility] Patient Information 2012 [place], [place]. Today, her white count is normal, as were your electrolytes, were all within normal range your CT scan,  shows it to have sigmoid diverticular disease without abscess.  You're being treated with antibiotics, and pain medication.  Please try to follow a liquid diet for the next 24 hours then continue to add foods and low fiber, low-residue followup with your doctor in [facility] by phone in the morning and 10.  Appointment for office evaluation.  Next week Low Fiber and Residue Restricted Diet A low fiber diet restricts foods that contain carbohydrates that are not digested in the small intestine. A diet containing about 10 g of fiber is considered low fiber. The diet needs to be individualized to suit patient tolerances and preferences and to avoid unnecessary restrictions. Generally, the foods emphasized in a low fiber diet have no skins or seeds. They may have been processed to remove bran, germ, or husks. Cooking may not necessarily eliminate the fiber. Cooking may, in fact, enable a greater quantity of fiber to be consumed in a lesser volume. Legumes and nuts are also restricted. The term low residue has also been used to describe low fiber diets, although the two are not the same. Residue refers to any substance that adds to bowel (colonic) contents, such as sloughed cells and intestinal bacteria, in addition to fiber. Residue-containing foods, prunes and prune juice, milk, and connective tissue from meats may also need to be eliminated. It is important to eliminate these foods during sudden (acute) attacks of inflammatory bowel disease,  when there is a partial obstruction due to another reason, or when minimal fecal output is desired. When these problems are gone, a more normal diet may be used. PURPOSE  Prevent blockage of a partially obstructed or narrowed gastrointestinal tract.   Reduce stool weight and volume.   Slow the movement of waste.  WHEN IS THIS DIET USED?  Acute phase of Crohn's disease, ulcerative colitis, regional enteritis, or diverticulitis.   Narrowing (stenosis) of intestinal or  esophageal tubes (lumina).   Transitional diet following surgery, injury (trauma), or illness.  ADEQUACY This diet is nutritionally adequate based on individual food choices according to the Recommended Dietary Allowances of the [employer]. CHOOSING FOODS Check labels, especially on foods from the starch list. Often, dietary fiber content is listed with the Nutrition Facts panel.  Breads and Starches  Allowed: White, [place], and pita breads, plain rolls, buns, or sweet rolls, doughnuts, waffles, pancakes, bagels. Plain muffins, sweet breads, biscuits, matzoth. Flour. Soda, saltine, or graham crackers. Pretzels, rusks, melba toast, zwieback. Cooked cereals: cornmeal, farina, cream cereals. Dry cereals: refined corn, wheat, rice, and oat cereals (check label). Potatoes prepared any way without skins, refined macaroni, spaghetti, noodles, refined rice.   Avoid: Bread, rolls, or crackers made with whole-wheat, multigrains, rye, bran seeds, nuts, or coconut. Corn tortillas, table-shells. Corn chips, tortilla chips. Cereals containing whole-grains, multigrains, bran, coconut, nuts, or raisins. Cooked or dry oatmeal. Coarse wheat cereals, granola. Cereals advertised as "high fiber." Potato skins. Whole-grain pasta, wild or brown rice. Popcorn.  Vegetables  Allowed:  Strained tomato and vegetable juices. Fresh: tender lettuce, cucumber, cabbage, spinach, bean sprouts. Cooked, canned: asparagus, bean sprouts, cut green or wax beans, cauliflower, pumpkin, beets, mushrooms, olives, spinach, yellow squash, tomato, tomato sauce (no seeds), zucchini (peeled), turnips. Canned sweet potatoes. Small amounts of celery, onion, radish, and green pepper may be used. Keep servings limited to  cup.   Avoid: Fresh, cooked, or canned: artichokes, baked beans, beet greens, broccoli, Brussels sprouts, French-style green beans, corn, kale, legumes, peas, sweet potatoes. Cooked: green or red cabbage, spinach.  Avoid large servings of any vegetables.  Fruit  Allowed:  All fruit juices except prune juice. Cooked or canned: apricots applesauce, cantaloupe, cherries, grapefruit, grapes, kiwi, mandarin oranges, peaches, pears, fruit cocktail, pineapple, plums, watermelon. Fresh: banana, grapes, cantaloupe, avocado, cherries, pineapple, grapefruit, kiwi, nectarines, peaches, oranges, blueberries, plums. Keep servings limited to  cup or 1 piece.   Avoid: Fresh: apple with or without skin, apricots, mango, pears, raspberries, strawberries. Prune juice, stewed or dried prunes. Dried fruits, raisins, dates. Avoid large servings of all fresh fruits.  Meat and Meat Substitutes  Allowed:  Ground or well-cooked tender beef, ham, veal, lamb, pork, or poultry. Eggs, plain cheese. Fish, oysters, shrimp, lobster, other seafood. Liver, organ meats.   Avoid: Tough, fibrous meats with gristle. Peanut butter, smooth or chunky. Cheese with seeds, nuts, or other foods not allowed. Nuts, seeds, legumes, dried peas, beans, lentils.  Milk  Allowed:  All milk products except those not allowed. Milk and milk product consumption should be minimal when low residue is desired.   Avoid: Yogurt that contains nuts or seeds.  Soups and Combination Foods  Allowed:  Bouillon, broth, or cream soups made from allowed foods. Any strained soup. Casseroles or mixed dishes made with allowed foods.   Avoid: Soups made from vegetables that are not allowed or that contain other foods not allowed.  Desserts and Sweets  Allowed:  Plain cakes and cookies, pie made  with allowed fruit, pudding, custard, cream pie. Gelatin, fruit, ice, sherbet, frozen ice pops. Ice cream, ice milk without nuts. Plain hard candy, honey, jelly, molasses, syrup, sugar, chocolate syrup, gumdrops, marshmallows.   Avoid: Desserts, cookies, or candies that contain nuts, peanut butter, or dried fruits. Jams, preserves with seeds, marmalade.  Fats and Oils  Allowed:   Margarine, butter, cream, mayonnaise, salad oils, plain salad dressings made from allowed foods. Plain gravy, crisp bacon without rind.   Avoid: Seeds, nuts, olives. Avocados.  Beverages  Allowed:  All, except those listed to avoid.   Avoid: Fruit juices with high pulp, prune juice.  Condiments  Allowed:  Ketchup, mustard, horseradish, vinegar, cream sauce, cheese sauce, cocoa powder. Spices in moderation: allspice, basil, bay leaves, celery powder or leaves, cinnamon, cumin powder, curry powder, ginger, mace, marjoram, onion or garlic powder, oregano, paprika, parsley flakes, ground pepper, rosemary, sage, savory, tarragon, thyme, turmeric.   Avoid: Coconut, pickles.  SAMPLE MEAL PLAN The following menu is provided as a sample. Your daily menu plans will vary. Be sure to include a minimum of the following each day in order to provide essential nutrients for the adult:  Starch/Bread/Cereal Group, 6 servings.   Fruit/Vegetable Group, 5 servings.   Meat/Meat Substitute Group, 2 servings.   Milk/Milk Substitute Group, 2 servings.  A serving is equal to  cup for fruits, vegetables, and cooked cereals or 1 piece for foods such as a piece of bread, 1 orange, or 1 apple. For dry cereals and crackers, use serving sizes listed on the label. Combination foods may count as full or partial servings from various food groups. Fats, desserts, and sweets may be added to the meal plan after the requirements for essential nutrients are met. SAMPLE MENU Breakfast   cup orange juice.   1 boiled egg.   1 slice white toast.   Margarine.    cup cornflakes.   1 cup milk.   Beverage.  Lunch   cup chicken noodle soup.   2 to 3 oz sliced roast beef.   2 slices seedless rye bread.   Mayonnaise.    cup tomato juice.   1 small banana.   Beverage.  Dinner  3 oz baked chicken.    cup scalloped potatoes.    cup cooked beets.   White dinner roll.   Margarine.    cup canned  peaches.   Beverage.  Document Released: 04/25/2002 Document Revised: 10/23/2011 Document Reviewed: 10/06/2011 [facility] Patient Information 2012 [place], [place].

## 2012-08-16 ENCOUNTER — Encounter: Payer: Self-pay | Admitting: Internal Medicine

## 2012-09-24 ENCOUNTER — Encounter: Payer: Self-pay | Admitting: Internal Medicine

## 2012-09-24 NOTE — Progress Notes (Signed)
Patient was on Dr Regino Schultze schedule for 10-20-12 with a notation that she saw Dr Juanda Chance "20+ years ago." However, she has also seen Dr Loreta Ave (2003), [facility] (2007) and Dr Noe Gens @ [facility] (12/2011) and Darryl Lent, PA @ [facility] (03/2012). Therefore, patient will either need to return to one of those GI facilities or have all of her records faxed for Dr Regino Schultze review before we will see her as a patient.

## 2012-10-20 ENCOUNTER — Ambulatory Visit: Payer: BC Managed Care – PPO | Admitting: Internal Medicine

## 2012-11-02 ENCOUNTER — Encounter: Payer: Self-pay | Admitting: Internal Medicine

## 2012-11-02 NOTE — Progress Notes (Signed)
Dr Juanda Chance has reviewed patient's extensive records from [facility]. Dr Juanda Chance has declined to take patient into her practice.

## 2013-03-03 IMAGING — CT CT HEAD W/O CM
1 series · 16 of 30 positions shown, 20 images · non-contrast
Comparison: None.

CLINICAL DATA: Headache.  Nausea.  Dizziness.

CT HEAD WITHOUT CONTRAST
TECHNIQUE: Contiguous axial images were obtained from the base of
the skull through the vertex without contrast.

[Series 2: head 4.8 h37s · axial · 0.42mm/px · z∈[-153,-20]mm · 16 of 32 slices shown, 20 images]
[im 2/32  brain]
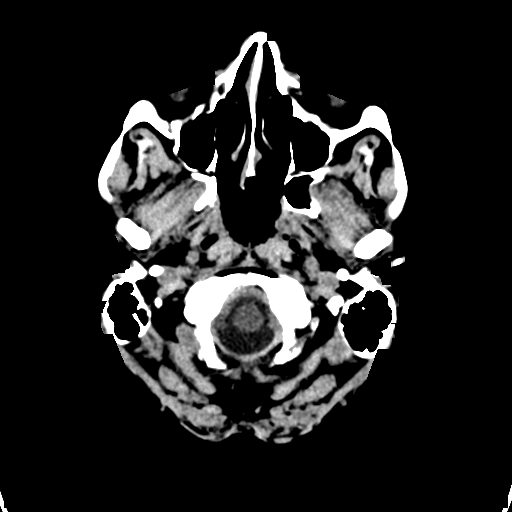
[im 2/32  bone]
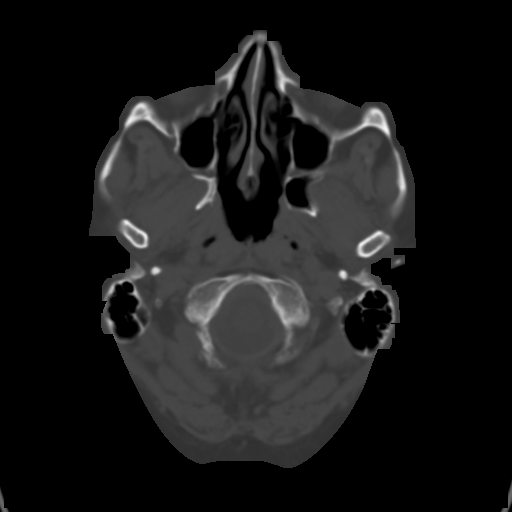
[im 4/32  brain]
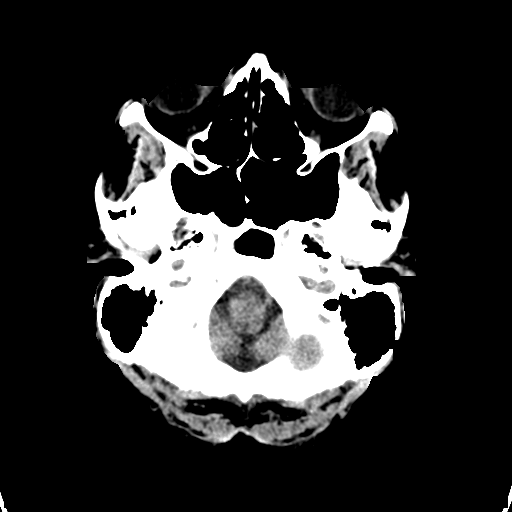
[im 6/32  brain]
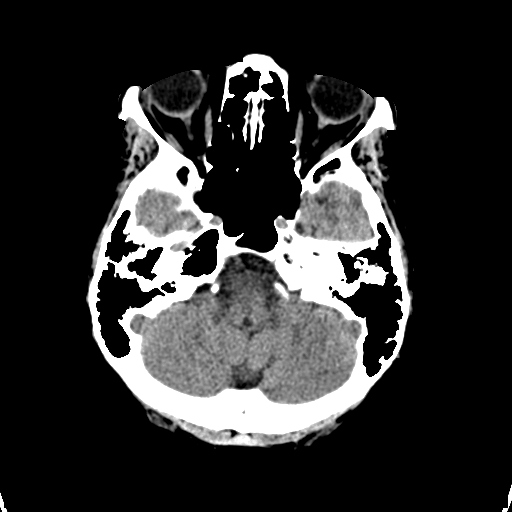
[im 8/32  brain]
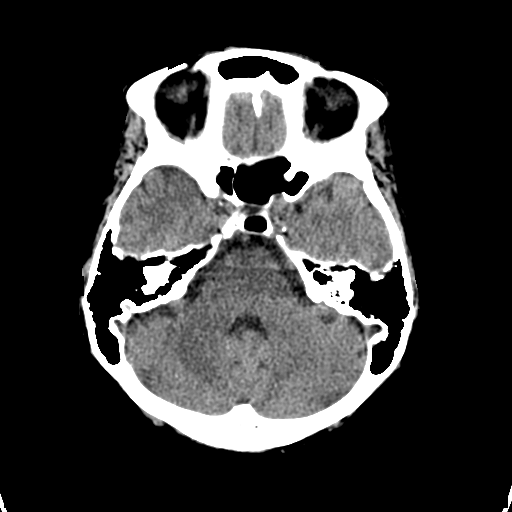
[im 9/32  brain]
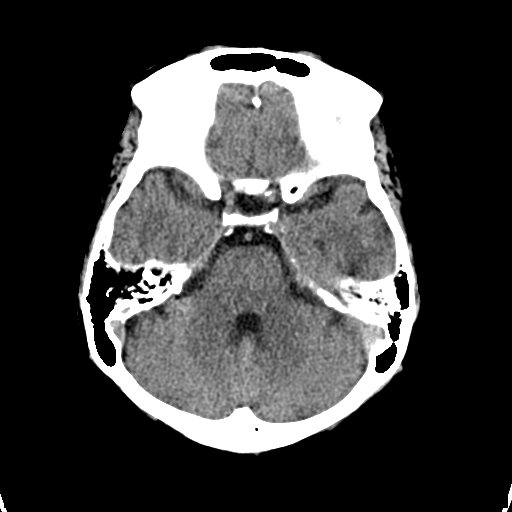
[im 9/32  bone]
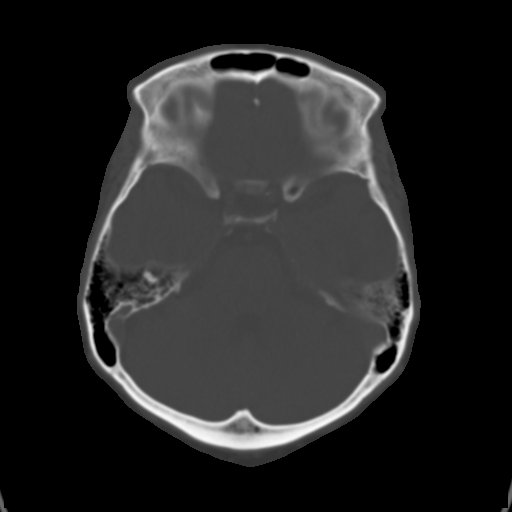
[im 11/32  brain]
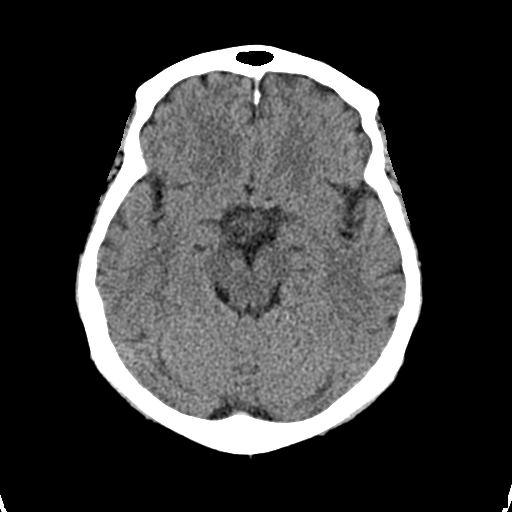
[im 13/32  brain]
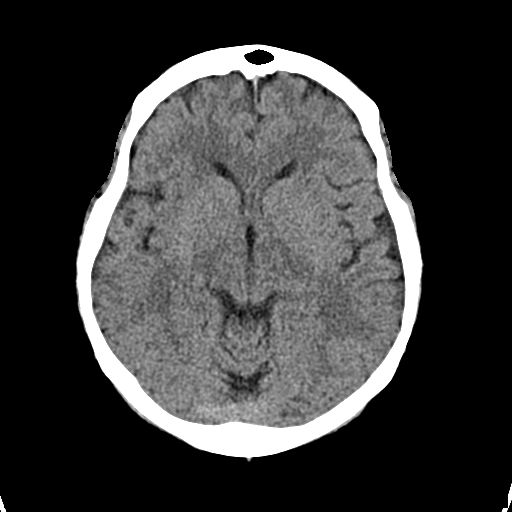
[im 15/32  brain]
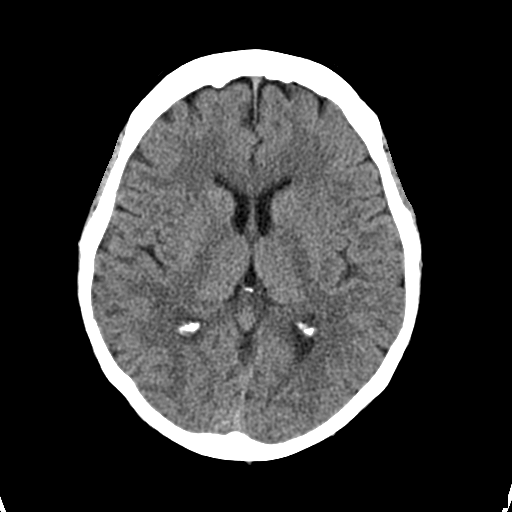
[im 17/32  brain]
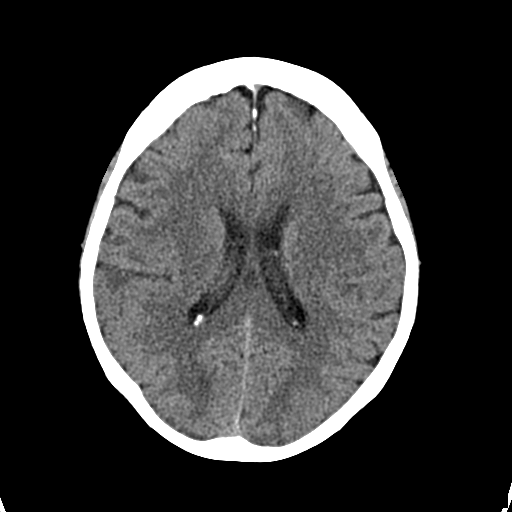
[im 17/32  bone]
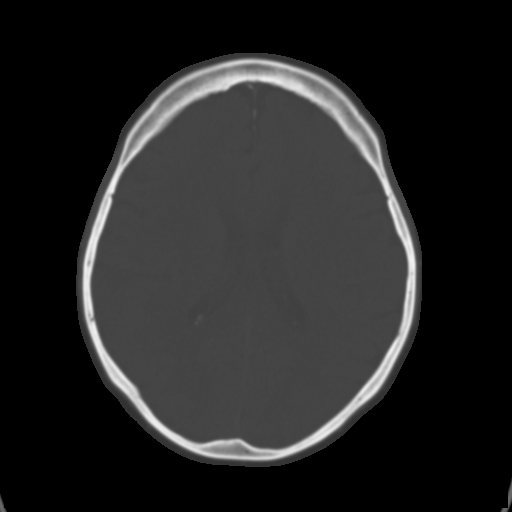
[im 19/32  brain]
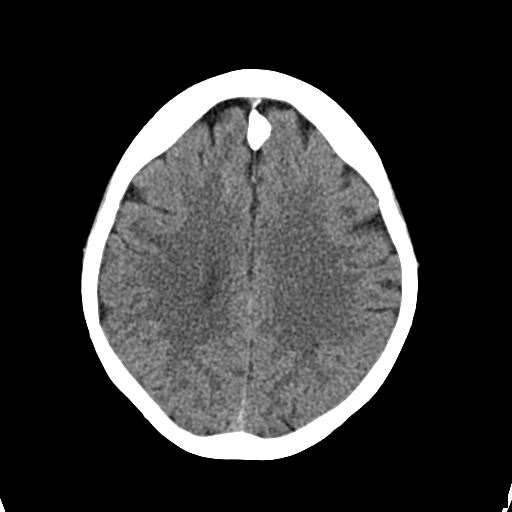
[im 21/32  brain]
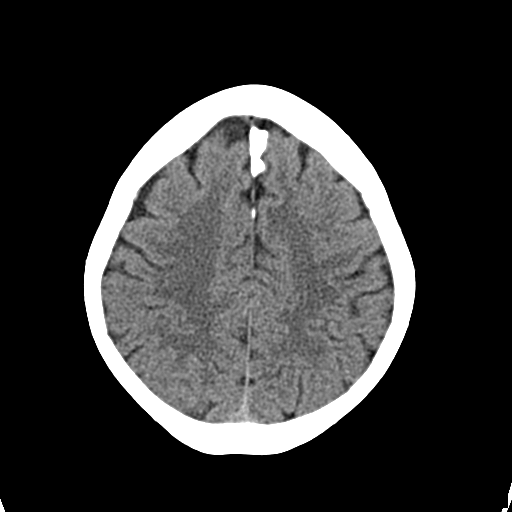
[im 23/32  brain]
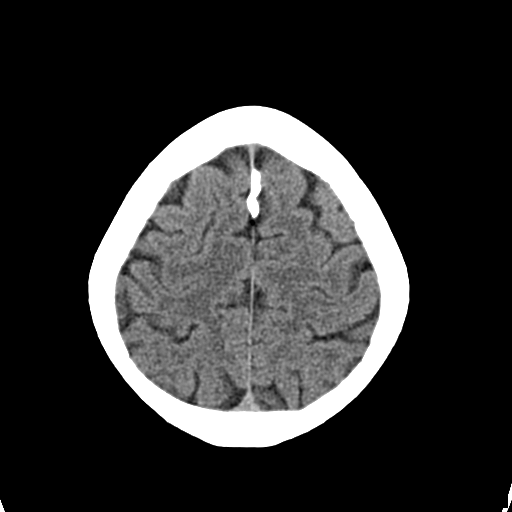
[im 24/32  brain]
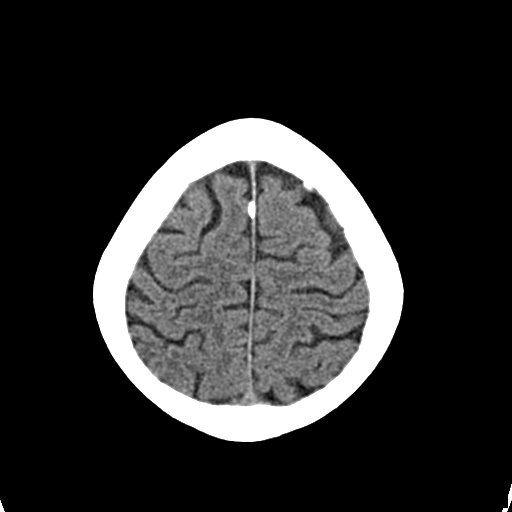
[im 24/32  bone]
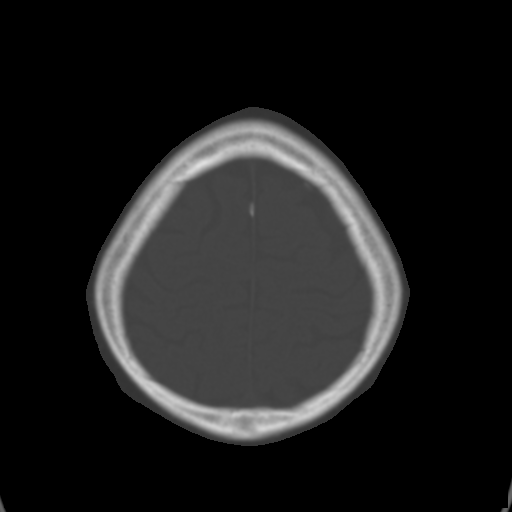
[im 26/32  brain]
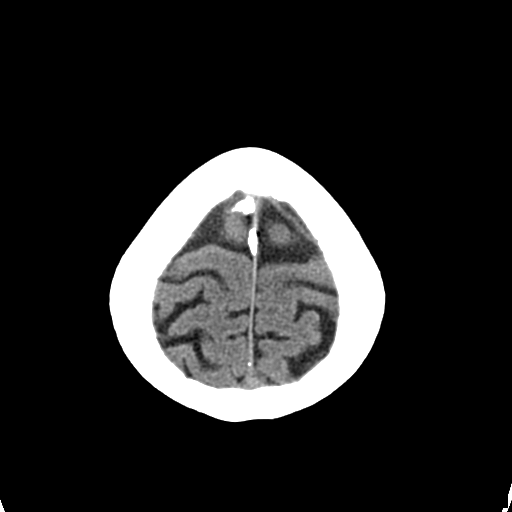
[im 28/32  brain]
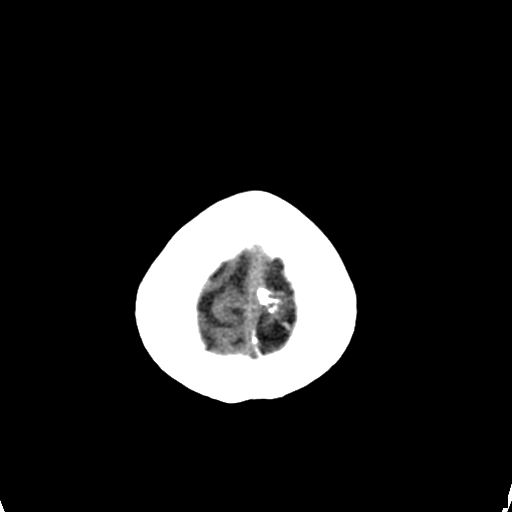
[im 30/32  brain]
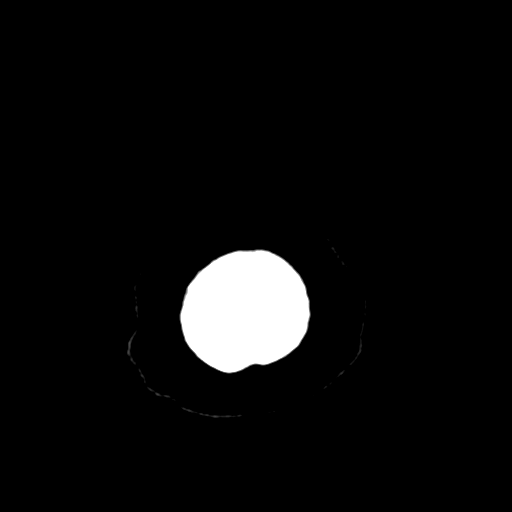

[16 of 30 positions shown; findings below may reference images not displayed]

FINDINGS: A subtle 5 x 3 mm hypodensity along the anterior limb of
the right internal capsule is shown on image 14 of series 2,
potentially artifact or a tiny remote lacunar infarct.

The brain stem, cerebellum, cerebral peduncles, thalami, basal
ganglia, basilar cisterns, and ventricular system appear
unremarkable.

No intracranial hemorrhage, mass lesion, or acute infarction is
identified.

The visualized paranasal sinuses appear clear.
IMPRESSION: 1.  No acute intracranial findings.
2.  Some faint hypodensity in the anterior limb of the right
internal capsule may be artifact or due to a tiny remote lacunar
infarct.

## 2013-08-19 ENCOUNTER — Encounter (HOSPITAL_BASED_OUTPATIENT_CLINIC_OR_DEPARTMENT_OTHER): Payer: Self-pay | Admitting: Student

## 2013-08-19 ENCOUNTER — Observation Stay (HOSPITAL_BASED_OUTPATIENT_CLINIC_OR_DEPARTMENT_OTHER)
Admission: EM | Admit: 2013-08-19 | Discharge: 2013-08-20 | Disposition: A | Discharge status: Home / Self Care | Payer: BC Managed Care – PPO | Attending: Internal Medicine | Admitting: Internal Medicine

## 2013-08-19 ENCOUNTER — Emergency Department (HOSPITAL_BASED_OUTPATIENT_CLINIC_OR_DEPARTMENT_OTHER): Payer: BC Managed Care – PPO

## 2013-08-19 DIAGNOSIS — I160 Hypertensive urgency: Secondary | ICD-10-CM

## 2013-08-19 DIAGNOSIS — E213 Hyperparathyroidism, unspecified: Secondary | ICD-10-CM | POA: Insufficient documentation

## 2013-08-19 DIAGNOSIS — IMO0001 Reserved for inherently not codable concepts without codable children: Secondary | ICD-10-CM

## 2013-08-19 DIAGNOSIS — E876 Hypokalemia: Secondary | ICD-10-CM

## 2013-08-19 DIAGNOSIS — Z79899 Other long term (current) drug therapy: Secondary | ICD-10-CM | POA: Insufficient documentation

## 2013-08-19 DIAGNOSIS — K219 Gastro-esophageal reflux disease without esophagitis: Secondary | ICD-10-CM | POA: Insufficient documentation

## 2013-08-19 DIAGNOSIS — R51 Headache: Secondary | ICD-10-CM

## 2013-08-19 DIAGNOSIS — I10 Essential (primary) hypertension: Principal | ICD-10-CM | POA: Insufficient documentation

## 2013-08-19 DIAGNOSIS — M797 Fibromyalgia: Secondary | ICD-10-CM | POA: Diagnosis present

## 2013-08-19 DIAGNOSIS — K760 Fatty (change of) liver, not elsewhere classified: Secondary | ICD-10-CM

## 2013-08-19 DIAGNOSIS — R42 Dizziness and giddiness: Secondary | ICD-10-CM | POA: Insufficient documentation

## 2013-08-19 HISTORY — DX: Disorder of kidney and ureter, unspecified: N28.9

## 2013-08-19 HISTORY — DX: Hyperparathyroidism, unspecified: E21.3

## 2013-08-19 HISTORY — DX: Gastro-esophageal reflux disease without esophagitis: K21.9

## 2013-08-19 LAB — BASIC METABOLIC PANEL
BUN: 12 mg/dL (ref 6–23)
CO2: 23 mEq/L (ref 19–32)
Chloride: 100 mEq/L (ref 96–112)
Creatinine, Ser: 0.8 mg/dL (ref 0.50–1.10)
GFR calc Af Amer: 90 mL/min — ABNORMAL LOW (ref >90)
Potassium: 3.9 mEq/L (ref 3.5–5.1)

## 2013-08-19 LAB — CBC
HCT: 35.9 % — ABNORMAL LOW (ref 36.0–46.0)
Hemoglobin: 12.3 g/dL (ref 12.0–15.0)
MCV: 84.1 fL (ref 78.0–100.0)
RBC: 4.27 MIL/uL (ref 3.87–5.11)
WBC: 4.1 10*3/uL (ref 4.0–10.5)

## 2013-08-19 LAB — URINALYSIS, ROUTINE W REFLEX MICROSCOPIC
Bilirubin Urine: NEGATIVE
Glucose, UA: NEGATIVE mg/dL
Hgb urine dipstick: NEGATIVE
Ketones, ur: NEGATIVE mg/dL
Protein, ur: NEGATIVE mg/dL
Urobilinogen, UA: 1 mg/dL (ref 0.0–1.0)

## 2013-08-19 LAB — URINE MICROSCOPIC-ADD ON

## 2013-08-19 MED ORDER — ZOLPIDEM TARTRATE 5 MG PO TABS: 5.0000 mg | ORAL_TABLET | Freq: Every evening | ORAL | Status: DC | PRN

## 2013-08-19 MED ORDER — LABETALOL HCL 5 MG/ML IV SOLN
10.0000 mg | Freq: Once | INTRAVENOUS | Status: AC
  Administered 2013-08-19: 10 mg via INTRAVENOUS
  Filled 2013-08-19: qty 4

## 2013-08-19 MED ORDER — ZOLPIDEM TARTRATE 5 MG PO TABS
10.0000 mg | ORAL_TABLET | Freq: Every evening | ORAL | Status: DC | PRN
  Administered 2013-08-19: 10 mg via ORAL
  Filled 2013-08-19: qty 2

## 2013-08-19 MED ORDER — LABETALOL HCL 5 MG/ML IV SOLN
10.0000 mg | Freq: Once | INTRAVENOUS | Status: AC
  Administered 2013-08-19: 10 mg via INTRAVENOUS

## 2013-08-19 MED ORDER — LORATADINE 10 MG PO TABS
10.0000 mg | ORAL_TABLET | Freq: Every day | ORAL | Status: DC
  Administered 2013-08-20: 11:00:00 10 mg via ORAL
  Filled 2013-08-19: qty 1

## 2013-08-19 MED ORDER — ENOXAPARIN SODIUM 40 MG/0.4ML ~~LOC~~ SOLN
40.0000 mg | SUBCUTANEOUS | Status: DC
  Administered 2013-08-19: 40 mg via SUBCUTANEOUS
  Filled 2013-08-19 (×2): qty 0.4

## 2013-08-19 MED ORDER — DULOXETINE HCL 60 MG PO CPEP
120.0000 mg | ORAL_CAPSULE | Freq: Every day | ORAL | Status: DC
  Administered 2013-08-20: 11:00:00 120 mg via ORAL
  Filled 2013-08-19: qty 2

## 2013-08-19 MED ORDER — MECLIZINE HCL 25 MG PO TABS: 25.0000 mg | ORAL_TABLET | Freq: Once | ORAL | Status: DC

## 2013-08-19 MED ORDER — ASPIRIN EC 81 MG PO TBEC
81.0000 mg | DELAYED_RELEASE_TABLET | Freq: Every day | ORAL | Status: DC
  Administered 2013-08-20: 11:00:00 81 mg via ORAL
  Filled 2013-08-19: qty 1

## 2013-08-19 MED ORDER — CYCLOBENZAPRINE HCL 10 MG PO TABS: 10.0000 mg | ORAL_TABLET | Freq: Two times a day (BID) | ORAL | Status: DC | PRN

## 2013-08-19 MED ORDER — POTASSIUM CHLORIDE CRYS ER 20 MEQ PO TBCR
40.0000 meq | EXTENDED_RELEASE_TABLET | Freq: Two times a day (BID) | ORAL | Status: DC
  Administered 2013-08-19 – 2013-08-20 (×2): 40 meq via ORAL
  Filled 2013-08-19 (×3): qty 2

## 2013-08-19 MED ORDER — ACETAMINOPHEN 650 MG RE SUPP: 650.0000 mg | Freq: Four times a day (QID) | RECTAL | Status: DC | PRN

## 2013-08-19 MED ORDER — LABETALOL HCL 5 MG/ML IV SOLN
10.0000 mg | INTRAVENOUS | Status: DC | PRN
  Filled 2013-08-19: qty 4

## 2013-08-19 MED ORDER — HYDRALAZINE HCL 20 MG/ML IJ SOLN
10.0000 mg | Freq: Once | INTRAMUSCULAR | Status: AC
  Administered 2013-08-19: 10 mg via INTRAVENOUS
  Filled 2013-08-19: qty 1

## 2013-08-19 MED ORDER — VITAMIN D (ERGOCALCIFEROL) 1.25 MG (50000 UNIT) PO CAPS
50000.0000 [IU] | ORAL_CAPSULE | ORAL | Status: DC
  Administered 2013-08-19: 23:00:00 50000 [IU] via ORAL
  Filled 2013-08-19 (×2): qty 1

## 2013-08-19 MED ORDER — ONDANSETRON HCL 4 MG PO TABS: 4.0000 mg | ORAL_TABLET | Freq: Four times a day (QID) | ORAL | Status: DC | PRN

## 2013-08-19 MED ORDER — ALBUTEROL SULFATE (5 MG/ML) 0.5% IN NEBU: 2.5000 mg | INHALATION_SOLUTION | RESPIRATORY_TRACT | Status: DC | PRN

## 2013-08-19 MED ORDER — ACETAMINOPHEN 325 MG PO TABS: 650.0000 mg | ORAL_TABLET | Freq: Four times a day (QID) | ORAL | Status: DC | PRN

## 2013-08-19 MED ORDER — ONDANSETRON HCL 4 MG/2ML IJ SOLN: 4.0000 mg | Freq: Four times a day (QID) | INTRAMUSCULAR | Status: DC | PRN

## 2013-08-19 MED ORDER — ZOLPIDEM TARTRATE 5 MG PO TABS: 10.0000 mg | ORAL_TABLET | Freq: Every day | ORAL | Status: DC

## 2013-08-19 MED ORDER — IRBESARTAN 150 MG PO TABS
150.0000 mg | ORAL_TABLET | Freq: Every day | ORAL | Status: DC
  Administered 2013-08-20: 11:00:00 150 mg via ORAL
  Filled 2013-08-19: qty 1

## 2013-08-19 MED ORDER — PANTOPRAZOLE SODIUM 40 MG PO TBEC
40.0000 mg | DELAYED_RELEASE_TABLET | Freq: Every day | ORAL | Status: DC
  Administered 2013-08-19 – 2013-08-20 (×2): 40 mg via ORAL
  Filled 2013-08-19 (×2): qty 1

## 2013-08-19 MED ORDER — SPIRONOLACTONE 25 MG PO TABS
25.0000 mg | ORAL_TABLET | Freq: Two times a day (BID) | ORAL | Status: DC
  Administered 2013-08-19 – 2013-08-20 (×2): 25 mg via ORAL
  Filled 2013-08-19 (×4): qty 1

## 2013-08-19 MED ORDER — SODIUM CHLORIDE 0.9 % IJ SOLN
3.0000 mL | Freq: Two times a day (BID) | INTRAMUSCULAR | Status: DC
  Administered 2013-08-19 – 2013-08-20 (×2): 3 mL via INTRAVENOUS

## 2013-08-19 MED ORDER — OXYCODONE-ACETAMINOPHEN 5-325 MG PO TABS
2.0000 | ORAL_TABLET | ORAL | Status: DC | PRN
  Administered 2013-08-19 – 2013-08-20 (×2): 2 via ORAL
  Administered 2013-08-20: 1 via ORAL
  Filled 2013-08-19 (×4): qty 2

## 2013-08-19 MED ORDER — TRAMADOL HCL 50 MG PO TABS: 50.0000 mg | ORAL_TABLET | Freq: Four times a day (QID) | ORAL | Status: DC | PRN

## 2013-08-19 NOTE — ED Notes (Signed)
Called to give report to receiving nurse she was unable to take report at present due to an emergency with another pt. She will either call back to get report as soon as she is able or agrees to have [facility] call enroute or give bedside report as this pt. Is stable

## 2013-08-19 NOTE — H&P (Signed)
[facility] History and Physical  Christina Vang ZOX:[id number] DOB: 09/29/1951 DOA: 08/19/2013  Referring physician: EDP PCP: Coralee Rud, PA-C  Outpatient Specialists:  1. Multiple  Chief Complaint: Dizziness  HPI: Christina Vang is a 62 y.o. female with history of poorly controlled hypertension, hyperparathyroidism, GERD, mitral valve prolapse, fibromyalgia, negative cardiac cath approximately 2 years ago, presented to med [facility] with history of acute onset of dizziness. Patient states that her blood pressures have been running in the range of 140/95 in the mornings-170/90 in the evenings. Her nephrologist is titrating her medications. She has been on current medications for approximately a year and claims compliance. She woke up at approximately 3 AM on 10/3 with "funny feeling in the head". He denies specific headache, diplopia. When she went up to use the bathroom, she felt dizzy but no unsteady gait. She felt some tingling in her hands which is not a new symptom. She came back and laid down and was unable to sleep for the rest of the night and continued to experience dizziness. She denies vertigo, earache or sore throat. She had mild nausea. At about 6:30 AM she checked her blood pressure which read systolic 210 mmHg. She took her to blood pressure medications earlier than usual. Despite this, her symptoms continued and she presented to med [facility] where blood pressure was 177/101. CT head was negative. Patient was transferred to [facility] for management of hypertensive urgency and evaluation of dizziness. Patient denies prior such symptoms.   Review of Systems: All systems reviewed and apart from history of presenting illness, pertinent for burning/sharp epigastric pain similar to her GERD symptoms-patient has not taken her medications for this today. No radiation of pain. All other systems negative.  Past Medical History  Diagnosis Date  .  Arthritis   . Fibromyalgia   . Mitral valve prolapse   . Hypertension   . Hyperparathyroidism   . Acute renal disease   . Hyperparathyroidism   . GERD (gastroesophageal reflux disease)    Past Surgical History  Procedure Laterality Date  . Abdominal hysterectomy    . Nissan fundiplication    . Knee surgery    . Cholecystectomy    . Liver biopsy     Social History:  reports that she has never smoked. She has never used smokeless tobacco. She reports that she does not drink alcohol or use illicit drugs. Married but separated. On week days, lives in a group home and on weekends goes back to her home. Independent of activities of daily living.  Allergies  Allergen Reactions  . Augmentin [Amoxicillin-Pot Clavulanate] Nausea And Vomiting  . Codeine Nausea And Vomiting  . Tetracyclines & Related Nausea And Vomiting    Family History  Problem Relation Age of Onset  . Hypertension Mother   . Hypertension Father   . Stroke Mother   . Stroke Father   . Diabetes Mother   . Diabetes Sister   . Diabetes Brother     Prior to Admission medications   Medication Sig Start Date End Date Taking? Authorizing Provider  amLODipine (NORVASC) 10 MG tablet Take 10 mg by mouth daily.      Historical Provider, MD  aspirin EC 81 MG tablet Take 81 mg by mouth daily.      Historical Provider, MD  cyclobenzaprine (FLEXERIL) 10 MG tablet Take 10 mg by mouth 3 (three) times daily.      Historical Provider, MD  DULoxetine (CYMBALTA) 60 MG  capsule Take 120 mg by mouth daily.      Historical Provider, MD  fexofenadine (ALLEGRA) 180 MG tablet Take 180 mg by mouth daily as needed. For allergies     Historical Provider, MD  Multiple Vitamin (MULITIVITAMIN WITH MINERALS) TABS Take 1 tablet by mouth daily.      Historical Provider, MD  potassium chloride SA (K-DUR,KLOR-CON) 20 MEQ tablet Take 40 mEq by mouth 2 (two) times daily.     Historical Provider, MD  promethazine (PHENERGAN) 25 MG tablet Take 25 mg by  mouth every 6 (six) hours as needed. For nausea      Historical Provider, MD  traMADol (ULTRAM) 50 MG tablet Take 50 mg by mouth 3 (three) times daily. Maximum dose= 8 tablets per day     Historical Provider, MD  Vitamin D, Ergocalciferol, (DRISDOL) 50000 UNITS CAPS Take 50,000 Units by mouth every 7 (seven) days. Take on Friday     Historical Provider, MD  zolpidem (AMBIEN) 10 MG tablet Take 10 mg by mouth at bedtime.      Historical Provider, MD   Physical Exam: Filed Vitals:   08/19/13 1346 08/19/13 1508 08/19/13 1627 08/19/13 1805  BP: 181/97 177/101 164/95 161/84  Pulse:  66 69 75  Temp:    98.6 F (37 C)  TempSrc:      Resp:  20 20 20   Weight:      SpO2:  99% 99% 100%     General exam: Moderately built and nourished female patient, lying comfortably supine on the gurney in no obvious distress.  Head, eyes and ENT: Nontraumatic and normocephalic. Pupils equally reacting to light and accommodation. Oral mucosa moist.  Neck: Supple. No JVD, carotid bruit or thyromegaly.  Lymphatics: No lymphadenopathy.  Respiratory system: Clear to auscultation. No increased work of breathing.  Cardiovascular system: S1 and S2 heard, RRR. No JVD, murmurs, gallops, clicks or pedal edema.  Gastrointestinal system: Abdomen is nondistended, soft and nontender. Normal bowel sounds heard. No organomegaly or masses appreciated.  Central nervous system: Alert and oriented. No focal neurological deficits. No nystagmus.  Extremities: Symmetric 5 x 5 power. Peripheral pulses symmetrically felt.  Skin: No rashes or acute findings.  Musculoskeletal system: Negative exam.  Psychiatry: Pleasant and cooperative.   Labs on Admission:  Basic Metabolic Panel:  Recent Labs Lab 08/19/13 1350  NA 136  K 3.9  CL 100  CO2 23  GLUCOSE 96  BUN 12  CREATININE 0.80  CALCIUM 10.5   Liver Function Tests: No results found for this basename: AST, ALT, ALKPHOS, BILITOT, PROT, ALBUMIN,  in the last 168  hours No results found for this basename: LIPASE, AMYLASE,  in the last 168 hours No results found for this basename: AMMONIA,  in the last 168 hours CBC:  Recent Labs Lab 08/19/13 1350  WBC 4.1  HGB 12.3  HCT 35.9*  MCV 84.1  PLT 265   Cardiac Enzymes:  Recent Labs Lab 08/19/13 1350  TROPONINI <0.30    BNP (last 3 results) No results found for this basename: PROBNP,  in the last 8760 hours CBG: No results found for this basename: GLUCAP,  in the last 168 hours  Radiological Exams on Admission: Ct Head Wo Contrast  08/19/2013   CLINICAL DATA:  Headache, dizziness and hypertension.  EXAM: CT HEAD WITHOUT CONTRAST  TECHNIQUE: Contiguous axial images were obtained from the base of the skull through the vertex without intravenous contrast.  COMPARISON:  11/14/2011  FINDINGS: The brain demonstrates  no evidence of hemorrhage, infarction, edema, mass effect, extra-axial fluid collection, hydrocephalus or mass lesion. The skull is unremarkable.  IMPRESSION: Normal head CT.   Electronically Signed   By: Irish Lack M.D.   On: 08/19/2013 13:58    EKG: No EKG seen on Epic. Will request.  Assessment/Plan Principal Problem:   Hypertensive urgency Active Problems:   Dizziness   Fibromyalgia   Hyperparathyroidism   GERD (gastroesophageal reflux disease)   Hypertensive urgency - Patient claims compliance to medications. Her medications will need to be titrated or new medication may have to be added. - Home medications have not yet been reconciled by pharmacy (above medication list is probably not accurate-does not take amlodipine anymore). We'll resume same, monitor and make changes as needed. - Labetalol IV when necessary.  Dizziness - Could be secondary to hypertensive urgency. However need to consider posterior circulation stroke. - Obtain MRI of brain without contrast.  GERD - PPI. - Although her symptoms are like her usual GERD, will get EKG and cycle cardiac  enzymes. - Apparently negative cardiac cath 2 years ago  Fibromyalgia - Pain management  Hyperparathyroidism - Calcium normal   Code Status: Full  Family Communication: None  Disposition Plan: Home in medically stable   Time spent: 65 minutes  [facility] Pager [phone]  If 7PM-7AM, please contact night-coverage [email or web address] Password [facility] 08/19/2013, 6:54 PM

## 2013-08-19 NOTE — ED Notes (Signed)
Orders to hold PO Meclizine for now per EDP

## 2013-08-19 NOTE — Progress Notes (Signed)
Patient medical reconciliation completed by pharmacy. Medications ordered per home. Patient on Inspra 50 mg twice daily and Olmesartan 20 mg daily for BP control.  Medications adjusted for formulary medications with Spironalactone 25 mg twice daily and Avapro 150 mg daily. BP currently 161/84 and patient states she is experiencing less pressure in her head. Overall stable. Will continue to monitor.

## 2013-08-19 NOTE — ED Notes (Signed)
Patient transported to CT 

## 2013-08-19 NOTE — ED Notes (Signed)
MD at bedside. 

## 2013-08-19 NOTE — Progress Notes (Signed)
Seen in [place] ED for hypertensive urgency and vertigo. CT brain negative. Blood pressure initially 200/92. Received several doses of labetalol. I asked Dr. Gwendolyn Grant to give a dose of IV hydralazine, then transfer to [facility] telemetry for treatment of hypertensive urgency and consideration for MRI.  Crista Curb, M.D. [phone]

## 2013-08-19 NOTE — ED Notes (Signed)
Pt reports sudden onset of dizziness this morning with reports of 3 or more readings of elevated BP.

## 2013-08-19 NOTE — ED Provider Notes (Addendum)
CSN: [id number]     Arrival date & time 08/19/13  1148 History   First MD Initiated Contact with Patient 08/19/13 1235     Chief Complaint  Patient presents with  . Dizziness  . Hypertension   (Consider location/radiation/quality/duration/timing/severity/associated sxs/prior Treatment) HPI Comments: Acute onset of dizziness at 3 am. No alleviating/exacerbating factors. Took BP meds early b/c her BP at home was in the 210s.  Patient is a 62 y.o. female presenting with neurologic complaint. The history is provided by the patient.  Neurologic Problem This is a new problem. The current episode started 6 to 12 hours ago. The problem occurs constantly. The problem has not changed since onset.Pertinent negatives include no chest pain, no abdominal pain, no headaches and no shortness of breath. Nothing aggravates the symptoms. Nothing relieves the symptoms. She has tried nothing for the symptoms.    Past Medical History  Diagnosis Date  . Arthritis   . Fibromyalgia   . Mitral valve prolapse   . Hypertension   . Hyperparathyroidism   . Acute renal disease    Past Surgical History  Procedure Laterality Date  . Abdominal hysterectomy    . Nissan fundiplication    . Knee surgery    . Cholecystectomy    . Liver biopsy     Family History  Problem Relation Age of Onset  . Hypertension Mother   . Hypertension Father   . Stroke Mother   . Stroke Father   . Diabetes Mother   . Diabetes Sister   . Diabetes Brother    History  Substance Use Topics  . Smoking status: Never Smoker   . Smokeless tobacco: Never Used  . Alcohol Use: No   OB History   Grav Para Term Preterm Abortions TAB SAB Ect Mult Living                 Review of Systems  Constitutional: Negative for fever.  Respiratory: Negative for shortness of breath.   Cardiovascular: Negative for chest pain.  Gastrointestinal: Negative for abdominal pain.  Neurological: Negative for headaches.  All other systems reviewed and  are negative.    Allergies  Augmentin; Codeine; and Tetracyclines & related  Home Medications   Current Outpatient Rx  Name  Route  Sig  Dispense  Refill  . amLODipine (NORVASC) 10 MG tablet   Oral   Take 10 mg by mouth daily.           Marland Kitchen aspirin EC 81 MG tablet   Oral   Take 81 mg by mouth daily.           . cyclobenzaprine (FLEXERIL) 10 MG tablet   Oral   Take 10 mg by mouth 3 (three) times daily.           . DULoxetine (CYMBALTA) 60 MG capsule   Oral   Take 120 mg by mouth daily.           . fexofenadine (ALLEGRA) 180 MG tablet   Oral   Take 180 mg by mouth daily as needed. For allergies          . Multiple Vitamin (MULITIVITAMIN WITH MINERALS) TABS   Oral   Take 1 tablet by mouth daily.           . potassium chloride SA (K-DUR,KLOR-CON) 20 MEQ tablet   Oral   Take 40 mEq by mouth 2 (two) times daily.          . promethazine (PHENERGAN) 25  MG tablet   Oral   Take 25 mg by mouth every 6 (six) hours as needed. For nausea           . traMADol (ULTRAM) 50 MG tablet   Oral   Take 50 mg by mouth 3 (three) times daily. Maximum dose= 8 tablets per day          . Vitamin D, Ergocalciferol, (DRISDOL) 50000 UNITS CAPS   Oral   Take 50,000 Units by mouth every 7 (seven) days. Take on Friday          . zolpidem (AMBIEN) 10 MG tablet   Oral   Take 10 mg by mouth at bedtime.            BP 177/101  Pulse 66  Temp(Src) 98.2 F (36.8 C) (Oral)  Resp 20  Wt 174 lb (78.926 kg)  BMI 27.25 kg/m2  SpO2 99% Physical Exam  Nursing note and vitals reviewed. Constitutional: She is oriented to person, place, and time. She appears well-developed and well-nourished. No distress.  HENT:  Head: Normocephalic and atraumatic.  Eyes: EOM are normal. Pupils are equal, round, and reactive to light.  Neck: Normal range of motion. Neck supple.  Cardiovascular: Normal rate and regular rhythm.  Exam reveals no friction rub.   No murmur heard. Pulmonary/Chest:  Effort normal. No respiratory distress. She has no wheezes. She has no rales.  Abdominal: Soft. She exhibits no distension. There is no tenderness. There is no rebound.  Musculoskeletal: Normal range of motion. She exhibits no edema.  Neurological: She is alert and oriented to person, place, and time. No cranial nerve deficit. She exhibits normal muscle tone. Coordination normal.  Skin: No rash noted. She is not diaphoretic.    ED Course  Procedures (including critical care time) Labs Review Labs Reviewed  URINALYSIS, ROUTINE W REFLEX MICROSCOPIC - Abnormal; Notable for the following:    Leukocytes, UA TRACE (*)    All other components within normal limits  URINE MICROSCOPIC-ADD ON - Abnormal; Notable for the following:    Bacteria, UA FEW (*)    All other components within normal limits  CBC - Abnormal; Notable for the following:    HCT 35.9 (*)    All other components within normal limits  BASIC METABOLIC PANEL - Abnormal; Notable for the following:    GFR calc non Af Amer 77 (*)    GFR calc Af Amer 90 (*)    All other components within normal limits  TROPONIN I   Imaging Review Ct Head Wo Contrast  08/19/2013   CLINICAL DATA:  Headache, dizziness and hypertension.  EXAM: CT HEAD WITHOUT CONTRAST  TECHNIQUE: Contiguous axial images were obtained from the base of the skull through the vertex without intravenous contrast.  COMPARISON:  11/14/2011  FINDINGS: The brain demonstrates no evidence of hemorrhage, infarction, edema, mass effect, extra-axial fluid collection, hydrocephalus or mass lesion. The skull is unremarkable.  IMPRESSION: Normal head CT.   Electronically Signed   By: Irish Lack M.D.   On: 08/19/2013 13:58    MDM   1. Hypertensive urgency   2. Dizziness    54F presents with dizziness. Acute onset at 3am, elevated BP at that time. Took her BPs meds early for that. Patient with constant dizziness, no difference with changing positions, standing up, sitting down.  Solely on amlodipine for BP control. Patient denies fevers, CP, SOB.  Here, initial EKG normal, BPs. Easily ambulatory, normal neuro exam. BPs elevated to high  200s. Concern for hypertension related dizziness. Given labetalol for BP control.  CT Head obtained to look for possible bleed. Patient's not ataxic, no need for emergent MRI for possible central dizziness.  BP improved with labetalol to 180s systolic, another dose given with improvement to high 170s. States this made her dizziness a little less. Labs normal, normal troponin, normal lytes. Admitted to [facility] for hypertensive urgency. Given hydralazine.     Dagmar Hait, MD 08/19/13 [phone]

## 2013-08-20 ENCOUNTER — Observation Stay (HOSPITAL_COMMUNITY): Payer: BC Managed Care – PPO

## 2013-08-20 LAB — BASIC METABOLIC PANEL
BUN: 15 mg/dL (ref 6–23)
CO2: 24 mEq/L (ref 19–32)
Calcium: 9.5 mg/dL (ref 8.4–10.5)
Chloride: 104 mEq/L (ref 96–112)
Glucose, Bld: 88 mg/dL (ref 70–99)
Potassium: 3.9 mEq/L (ref 3.5–5.1)
Sodium: 140 mEq/L (ref 135–145)

## 2013-08-20 LAB — CBC
Hemoglobin: 11.6 g/dL — ABNORMAL LOW (ref 12.0–15.0)
MCH: 28.9 pg (ref 26.0–34.0)
MCV: 84.3 fL (ref 78.0–100.0)
Platelets: 250 10*3/uL (ref 150–400)
RBC: 4.02 MIL/uL (ref 3.87–5.11)
RDW: 13.8 % (ref 11.5–15.5)
WBC: 4 10*3/uL (ref 4.0–10.5)

## 2013-08-20 LAB — TROPONIN I: Troponin I: <0.3 ng/mL (ref <0.30)

## 2013-08-20 MED ORDER — INFLUENZA VAC SPLIT QUAD 0.5 ML IM SUSP: 0.5000 mL | INTRAMUSCULAR | Status: DC

## 2013-08-20 MED ORDER — INFLUENZA VAC SPLIT QUAD 0.5 ML IM SUSP
0.5000 mL | Freq: Once | INTRAMUSCULAR | Status: DC
  Filled 2013-08-20: qty 0.5

## 2013-08-20 NOTE — Progress Notes (Signed)
Brief Nutrition Note:  RD pulled to pt for positive malnutrition screening tool.  Pt states she has been eating well, but lost some weight r/t her thyroid. Denied any nutrition needs at this time.  Wt Readings from Last 5 Encounters:  08/19/13 173 lb 15.1 oz (78.9 kg)  11/15/11 163 lb 14.4 oz (74.345 kg)  11/11/11 167 lb (75.751 kg)  Body mass index is 27.3 kg/(m^2). overweight   Diet: heart, didn't eat much breakfast this morning, but only because it was cold when she got back from MRI. Hungry for lunch.   Chart reviewed, no nutrition interventions warranted at this time. Please consult as needed.   Isabell Jarvis RD, LDN Pager [phone] After Hours pager [phone]

## 2013-08-20 NOTE — Discharge Summary (Signed)
Physician Discharge Summary  Christina Vang ZOX:[id number] DOB: May 27, 1951 DOA: 08/19/2013  PCP: Coralee Rud, PA-C Primary Nephrologist: Dr. Lisette Abu  Admit date: 08/19/2013 Discharge date: 08/20/2013  Time spent: Less than 30 minutes  Recommendations for Outpatient Follow-up:  1. Dr. Lisette Abu, Nephrology in 3 days 2. Dr. Roxanne Mins, PCP 3. Consider outpatient evaluation of mild anemia, as deemed necessary.   Discharge Diagnoses:  Principal Problem:   Hypertensive urgency Active Problems:   Dizziness   Fibromyalgia   Hyperparathyroidism   GERD (gastroesophageal reflux disease)   Discharge Condition: Improved & Stable  Diet recommendation: Heart healthy diet.  Filed Weights   08/19/13 1202 08/19/13 1854  Weight: 78.926 kg (174 lb) 78.9 kg (173 lb 15.1 oz)    History of present illness:  62 y.o. female with history of poorly controlled hypertension, hyperparathyroidism, GERD, mitral valve prolapse, fibromyalgia, negative cardiac cath approximately 2 years ago, presented to med [facility] with history of acute onset of dizziness. Patient states that her blood pressures have been running in the range of 140/95 in the mornings-170/90 in the evenings. Her nephrologist is titrating her medications. She has been on current medications for approximately a year and claims compliance. She woke up at approximately 3 AM on 10/3 with "funny feeling in the head". He denies specific headache, diplopia. When she went up to use the bathroom, she felt dizzy but no unsteady gait. She felt some tingling in her hands which is not a new symptom. She came back and laid down and was unable to sleep for the rest of the night and continued to experience dizziness. She denies vertigo, earache or sore throat. She had mild nausea. At about 6:30 AM she checked her blood pressure which read systolic 210 mmHg. She took her to blood pressure medications earlier than usual. Despite  this, her symptoms continued and she presented to med [facility] where blood pressure was 177/101. CT head was negative. Patient was transferred to [facility] for management of hypertensive urgency and evaluation of dizziness. Patient denies prior such symptoms  Hospital Course:   Hypertensive urgency  - Patient claims compliance to medications.  - Patient was resumed on her home medications with reasonable blood pressure control. - Her blood pressure medications may require further titration or addition of a third agent as outpatient. She has been advised to followup with her nephrologist on Monday and she verbalizes understanding.  Dizziness  - Could be secondary to hypertensive urgency.  - No acute findings on MRI brain. - Dizziness improved/resolved.  GERD  - PPI.  - Apparently negative cardiac cath 2 years ago  - No chest pains. EKG without acute changes and troponins x3: Negative  Fibromyalgia  - Pain management   Hyperparathyroidism  - Calcium normal    Consultations:  None  Procedures:  None    Discharge Exam:  Complaints:  Mild headache this morning. No dizziness. Denies chest pain. Feels much better than on admission.  Filed Vitals:   08/19/13 2136 08/19/13 2256 08/20/13 0005 08/20/13 0648  BP: 183/90 156/87 125/74 121/70  Pulse: 78 71 65 65  Temp: 98.6 F (37 C)   98.3 F (36.8 C)  TempSrc:    Oral  Resp: 20   20  Height:      Weight:      SpO2: 100%   100%    General exam: Pleasant and comfortable. Respiratory system: Clear. No increased work of breathing. Cardiovascular system: S1 & S2  heard, RRR. No JVD, murmurs, gallops, clicks or pedal edema. Telemetry: Sinus rhythm Gastrointestinal system: Abdomen is nondistended, soft and nontender. Normal bowel sounds heard. Central nervous system: Alert and oriented. No focal neurological deficits. Extremities: Symmetric 5 x 5 power.  Discharge Instructions      Discharge Orders    Future Orders Complete By Expires   Call MD for:  persistant dizziness or light-headedness  As directed    Call MD for:  severe uncontrolled pain  As directed    Diet - low sodium heart healthy  As directed    Increase activity slowly  As directed        Medication List         aspirin EC 81 MG tablet  Take 81 mg by mouth daily.     cyclobenzaprine 10 MG tablet  Commonly known as:  FLEXERIL  Take 10 mg by mouth 2 (two) times daily as needed for muscle spasms.     DULoxetine 60 MG capsule  Commonly known as:  CYMBALTA  Take 120 mg by mouth daily.     eplerenone 50 MG tablet  Commonly known as:  INSPRA  Take 50 mg by mouth 2 (two) times daily.     fexofenadine 180 MG tablet  Commonly known as:  ALLEGRA  Take 180 mg by mouth daily as needed (allergies). For allergies     multivitamin with minerals Tabs tablet  Take 1 tablet by mouth daily.     olmesartan 20 MG tablet  Commonly known as:  BENICAR  Take 20 mg by mouth daily.     OVER THE COUNTER MEDICATION  Take 1 tablet by mouth 2 (two) times daily.     oxyCODONE-acetaminophen 10-325 MG per tablet  Commonly known as:  PERCOCET  Take 1 tablet by mouth every 4 (four) hours as needed for pain.     potassium chloride SA 20 MEQ tablet  Commonly known as:  K-DUR,KLOR-CON  Take 40 mEq by mouth 2 (two) times daily.     promethazine 25 MG tablet  Commonly known as:  PHENERGAN  Take 25 mg by mouth every 6 (six) hours as needed for nausea. For nausea     Vitamin D (Ergocalciferol) 50000 UNITS Caps capsule  Commonly known as:  DRISDOL  Take 50,000 Units by mouth every 7 (seven) days. Take on Friday     zolpidem 10 MG tablet  Commonly known as:  AMBIEN  Take 10 mg by mouth at bedtime.       Follow-up Information   Schedule an appointment as soon as possible for a visit with DURAN,MICHAEL R, PA-C.   Specialty:  Cardiology   Contact information:   [facility]  [address] [place]  [address] [phone]       Follow up with Adetoye Lufadeju. Schedule an appointment as soon as possible for a visit in 3 days. (BP management.)    Specialty:  Nephrology   Contact information:   [address] [place] [address]        The results of significant diagnostics from this hospitalization (including imaging, microbiology, ancillary and laboratory) are listed below for reference.    Significant Diagnostic Studies: Ct Head Wo Contrast  08/19/2013   CLINICAL DATA:  Headache, dizziness and hypertension.  EXAM: CT HEAD WITHOUT CONTRAST  TECHNIQUE: Contiguous axial images were obtained from the base of the skull through the vertex without intravenous contrast.  COMPARISON:  11/14/2011  FINDINGS:  The brain demonstrates no evidence of hemorrhage, infarction, edema, mass effect, extra-axial fluid collection, hydrocephalus or mass lesion. The skull is unremarkable.  IMPRESSION: Normal head CT.   Electronically Signed   By: Irish Lack M.D.   On: 08/19/2013 13:58   Mr Brain Wo Contrast  08/20/2013   CLINICAL DATA:  Poorly controlled hypertension. Dizziness with hypertensive urgency.  EXAM: MRI HEAD WITHOUT CONTRAST  TECHNIQUE: Multiplanar, multisequence MR imaging was performed. No intravenous contrast was administered.  COMPARISON:  CT head 08/19/2013.  FINDINGS: No evidence for acute infarction, hemorrhage, mass lesion, hydrocephalus, or extra-axial fluid. Normal cerebral volume. Subcortical greater than periventricular white matter signal abnormality, likely chronic microvascular ischemic change.  No posterior circulation white matter changes to suggest acute hypertensive encephalopathy. No microbleeds on MPGR sequence.  Flow voids are maintained in the carotid, basilar, and vertebral arteries. No foci of chronic hemorrhage. No large vessel infarct.  Normal-appearing calvarium, skull base, and upper cervical region. Falx ossification. No midline abnormalities. Negative  extracranial soft tissues, orbits, sinuses, and mastoids.  Compared with prior CT, there is no change.  IMPRESSION: No acute stroke or hemorrhage. No evidence for PRES/hypertensive encephalopathy. No evidence for posterior circulation infarction.  Subcortical greater than periventricular chronic white matter signal abnormality, likely chronic microvascular ischemic change related to hypertension.   Electronically Signed   By: Davonna Belling M.D.   On: 08/20/2013 11:49    Microbiology: No results found for this or any previous visit (from the past 240 hour(s)).   Labs: Basic Metabolic Panel:  Recent Labs Lab 08/19/13 1350 08/20/13 0634  NA 136 140  K 3.9 3.9  CL 100 104  CO2 23 24  GLUCOSE 96 88  BUN 12 15  CREATININE 0.80 0.83  CALCIUM 10.5 9.5   Liver Function Tests: No results found for this basename: AST, ALT, ALKPHOS, BILITOT, PROT, ALBUMIN,  in the last 168 hours No results found for this basename: LIPASE, AMYLASE,  in the last 168 hours No results found for this basename: AMMONIA,  in the last 168 hours CBC:  Recent Labs Lab 08/19/13 1350 08/20/13 0634  WBC 4.1 4.0  HGB 12.3 11.6*  HCT 35.9* 33.9*  MCV 84.1 84.3  PLT 265 250   Cardiac Enzymes:  Recent Labs Lab 08/19/13 1350 08/19/13 2045 08/20/13 0010  TROPONINI <0.30 <0.30 <0.30   BNP: BNP (last 3 results) No results found for this basename: PROBNP,  in the last 8760 hours CBG: No results found for this basename: GLUCAP,  in the last 168 hours  Additional labs: 1. None   Signed:  Onyx Edgley  [facility] 08/20/2013, 12:38 PM

## 2013-08-20 NOTE — Progress Notes (Signed)
NURSING PROGRESS NOTE  Christina Vang [id number] Discharge Data: 08/20/2013 2:09 PM Attending Provider: Elease Etienne, MD LKG:MWNUU,VOZDGUY R, PA-C     Loney Loh to be D/C'd Home per MD order.  Discussed with the patient the After Visit Summary and all questions fully answered. All IV's discontinued with no bleeding noted. All belongings returned to patient for patient to take home.   Last Vital Signs:  Blood pressure 121/70, pulse 65, temperature 98.3 F (36.8 C), temperature source Oral, resp. rate 20, height 5' 6.93" (1.7 m), weight 78.9 kg (173 lb 15.1 oz), SpO2 100.00%.  Discharge Medication List   Medication List         aspirin EC 81 MG tablet  Take 81 mg by mouth daily.     cyclobenzaprine 10 MG tablet  Commonly known as:  FLEXERIL  Take 10 mg by mouth 2 (two) times daily as needed for muscle spasms.     DULoxetine 60 MG capsule  Commonly known as:  CYMBALTA  Take 120 mg by mouth daily.     eplerenone 50 MG tablet  Commonly known as:  INSPRA  Take 50 mg by mouth 2 (two) times daily.     fexofenadine 180 MG tablet  Commonly known as:  ALLEGRA  Take 180 mg by mouth daily as needed (allergies). For allergies     multivitamin with minerals Tabs tablet  Take 1 tablet by mouth daily.     olmesartan 20 MG tablet  Commonly known as:  BENICAR  Take 20 mg by mouth daily.     OVER THE COUNTER MEDICATION  Take 1 tablet by mouth 2 (two) times daily.     oxyCODONE-acetaminophen 10-325 MG per tablet  Commonly known as:  PERCOCET  Take 1 tablet by mouth every 4 (four) hours as needed for pain.     potassium chloride SA 20 MEQ tablet  Commonly known as:  K-DUR,KLOR-CON  Take 40 mEq by mouth 2 (two) times daily.     promethazine 25 MG tablet  Commonly known as:  PHENERGAN  Take 25 mg by mouth every 6 (six) hours as needed for nausea. For nausea     Vitamin D (Ergocalciferol) 50000 UNITS Caps capsule  Commonly known as:  DRISDOL  Take 50,000 Units by  mouth every 7 (seven) days. Take on Friday     zolpidem 10 MG tablet  Commonly known as:  AMBIEN  Take 10 mg by mouth at bedtime.

## 2013-08-20 NOTE — Care Management Utilization Note (Signed)
UR completed.    Cooper Stamp Wise Kiah Keay, RN, BSN Phone #[phone]  

## 2013-08-21 ENCOUNTER — Encounter (HOSPITAL_BASED_OUTPATIENT_CLINIC_OR_DEPARTMENT_OTHER): Payer: Self-pay | Admitting: *Deleted

## 2013-08-21 ENCOUNTER — Emergency Department (HOSPITAL_BASED_OUTPATIENT_CLINIC_OR_DEPARTMENT_OTHER)
Admission: EM | Admit: 2013-08-21 | Discharge: 2013-08-21 | Disposition: A | Discharge status: Home / Self Care | Payer: BC Managed Care – PPO | Attending: Emergency Medicine | Admitting: Emergency Medicine

## 2013-08-21 ENCOUNTER — Emergency Department (HOSPITAL_BASED_OUTPATIENT_CLINIC_OR_DEPARTMENT_OTHER): Payer: BC Managed Care – PPO

## 2013-08-21 DIAGNOSIS — Z87448 Personal history of other diseases of urinary system: Secondary | ICD-10-CM | POA: Insufficient documentation

## 2013-08-21 DIAGNOSIS — M129 Arthropathy, unspecified: Secondary | ICD-10-CM | POA: Insufficient documentation

## 2013-08-21 DIAGNOSIS — R110 Nausea: Secondary | ICD-10-CM | POA: Insufficient documentation

## 2013-08-21 DIAGNOSIS — Z79899 Other long term (current) drug therapy: Secondary | ICD-10-CM | POA: Insufficient documentation

## 2013-08-21 DIAGNOSIS — Z8719 Personal history of other diseases of the digestive system: Secondary | ICD-10-CM | POA: Insufficient documentation

## 2013-08-21 DIAGNOSIS — R42 Dizziness and giddiness: Secondary | ICD-10-CM | POA: Insufficient documentation

## 2013-08-21 DIAGNOSIS — Z862 Personal history of diseases of the blood and blood-forming organs and certain disorders involving the immune mechanism: Secondary | ICD-10-CM | POA: Insufficient documentation

## 2013-08-21 DIAGNOSIS — R51 Headache: Secondary | ICD-10-CM | POA: Insufficient documentation

## 2013-08-21 DIAGNOSIS — Z7982 Long term (current) use of aspirin: Secondary | ICD-10-CM | POA: Insufficient documentation

## 2013-08-21 DIAGNOSIS — Z8639 Personal history of other endocrine, nutritional and metabolic disease: Secondary | ICD-10-CM | POA: Insufficient documentation

## 2013-08-21 DIAGNOSIS — I10 Essential (primary) hypertension: Secondary | ICD-10-CM

## 2013-08-21 LAB — TROPONIN I: Troponin I: <0.3 ng/mL (ref <0.30)

## 2013-08-21 LAB — CBC WITH DIFFERENTIAL/PLATELET
Basophils Absolute: 0 10*3/uL (ref 0.0–0.1)
Eosinophils Relative: 2 % (ref 0–5)
HCT: 37.1 % (ref 36.0–46.0)
Hemoglobin: 12.6 g/dL (ref 12.0–15.0)
Lymphocytes Relative: 30 % (ref 12–46)
MCHC: 34 g/dL (ref 30.0–36.0)
MCV: 84.3 fL (ref 78.0–100.0)
Monocytes Absolute: 0.4 10*3/uL (ref 0.1–1.0)
Monocytes Relative: 8 % (ref 3–12)
Neutro Abs: 2.7 10*3/uL (ref 1.7–7.7)
RDW: 12.9 % (ref 11.5–15.5)

## 2013-08-21 LAB — COMPREHENSIVE METABOLIC PANEL
ALT: 34 U/L (ref 0–35)
AST: 21 U/L (ref 0–37)
BUN: 14 mg/dL (ref 6–23)
CO2: 22 mEq/L (ref 19–32)
Calcium: 10.6 mg/dL — ABNORMAL HIGH (ref 8.4–10.5)
Chloride: 100 mEq/L (ref 96–112)
Creatinine, Ser: 1 mg/dL (ref 0.50–1.10)
GFR calc Af Amer: 68 mL/min — ABNORMAL LOW (ref >90)
GFR calc non Af Amer: 59 mL/min — ABNORMAL LOW (ref >90)
Sodium: 133 mEq/L — ABNORMAL LOW (ref 135–145)
Total Bilirubin: 0.5 mg/dL (ref 0.3–1.2)

## 2013-08-21 MED ORDER — LABETALOL HCL 5 MG/ML IV SOLN
20.0000 mg | Freq: Once | INTRAVENOUS | Status: AC
  Administered 2013-08-21: 20 mg via INTRAVENOUS
  Filled 2013-08-21: qty 4

## 2013-08-21 MED ORDER — ONDANSETRON 4 MG PO TBDP
4.0000 mg | ORAL_TABLET | Freq: Once | ORAL | Status: AC
  Administered 2013-08-21: 4 mg via ORAL
  Filled 2013-08-21: qty 1

## 2013-08-21 MED ORDER — HYDROCODONE-ACETAMINOPHEN 5-325 MG PO TABS
1.0000 | ORAL_TABLET | Freq: Once | ORAL | Status: AC
  Administered 2013-08-21: 1 via ORAL
  Filled 2013-08-21: qty 1

## 2013-08-21 NOTE — ED Notes (Signed)
I took follow up ECG for Dr. Manus Gunning and gave him a copy.

## 2013-08-21 NOTE — ED Provider Notes (Signed)
CSN: [id number]     Arrival date & time 08/21/13  2057 History  This chart was scribed for Glynn Octave, MD by Ardelia Mems, ED Scribe. This patient was seen in room MH12/MH12 and the patient's care was started at 9:25 PM.  Chief Complaint  Patient presents with  . Hypertension    The history is provided by the patient. No language interpreter was used.    HPI Comments: Christina Vang is a 62 y.o. female with a history of HTN and hyperparathyroidism who presents to the Emergency Department complaining of hypertension. She reports a gradual onset, intermittent generalized headache onset today, which she believes may be due to taking a NTG tablet today, or which may be due to her elevated BP. She also reports associated light-headedness, dizziness and nausea. She states that her dizziness is worsened with standing. ED blood pressure is 186/100. She states that she was recently admitted to the hospital 2 days ago with dizziness, and discharged yesterday. She states that she is compliant with her prescribed Inspra and Benicar. She states that she has a cardiac history of mitral valve prolapse and a catheterization in 2011. She states that she has a history of GERD which has also been causing her mild chest pain today. She denies emesis, abdominal pain, visual disturbances or any other symptoms.  PCP-  Dr. Roxanne Mins   Past Medical History  Diagnosis Date  . Arthritis   . Fibromyalgia   . Mitral valve prolapse   . Hypertension   . Hyperparathyroidism   . Acute renal disease   . Hyperparathyroidism   . GERD (gastroesophageal reflux disease)    Past Surgical History  Procedure Laterality Date  . Abdominal hysterectomy    . Nissan fundiplication    . Knee surgery    . Cholecystectomy    . Liver biopsy     Family History  Problem Relation Age of Onset  . Hypertension Mother   . Hypertension Father   . Stroke Mother   . Stroke Father   . Diabetes Mother   . Diabetes Sister    . Diabetes Brother    History  Substance Use Topics  . Smoking status: Never Smoker   . Smokeless tobacco: Never Used  . Alcohol Use: No   OB History   Grav Para Term Preterm Abortions TAB SAB Ect Mult Living                 Review of Systems A complete 10 system review of systems was obtained and all systems are negative except as noted in the HPI and PMH.   Allergies  Augmentin; Codeine; and Tetracyclines & related  Home Medications   Current Outpatient Rx  Name  Route  Sig  Dispense  Refill  . aspirin EC 81 MG tablet   Oral   Take 81 mg by mouth daily.           . cyclobenzaprine (FLEXERIL) 10 MG tablet   Oral   Take 10 mg by mouth 2 (two) times daily as needed for muscle spasms.          . DULoxetine (CYMBALTA) 60 MG capsule   Oral   Take 120 mg by mouth daily.          Marland Kitchen eplerenone (INSPRA) 50 MG tablet   Oral   Take 50 mg by mouth 2 (two) times daily.         . fexofenadine (ALLEGRA) 180 MG tablet   Oral  Take 180 mg by mouth daily as needed (allergies). For allergies         . Multiple Vitamin (MULITIVITAMIN WITH MINERALS) TABS   Oral   Take 1 tablet by mouth daily.          Marland Kitchen olmesartan (BENICAR) 20 MG tablet   Oral   Take 20 mg by mouth daily.         Marland Kitchen OVER THE COUNTER MEDICATION   Oral   Take 1 tablet by mouth 2 (two) times daily.         Marland Kitchen oxyCODONE-acetaminophen (PERCOCET) 10-325 MG per tablet   Oral   Take 1 tablet by mouth every 4 (four) hours as needed for pain.         . potassium chloride SA (K-DUR,KLOR-CON) 20 MEQ tablet   Oral   Take 40 mEq by mouth 2 (two) times daily.          . promethazine (PHENERGAN) 25 MG tablet   Oral   Take 25 mg by mouth every 6 (six) hours as needed for nausea. For nausea         . Vitamin D, Ergocalciferol, (DRISDOL) 50000 UNITS CAPS   Oral   Take 50,000 Units by mouth every 7 (seven) days. Take on Friday         . zolpidem (AMBIEN) 10 MG tablet   Oral   Take 10 mg by  mouth at bedtime.           Triage Vitals: BP 186/100  Pulse 92  Temp(Src) 98.8 F (37.1 C) (Oral)  Resp 16  Ht 5\' 6"  (1.676 m)  Wt 173 lb (78.472 kg)  BMI 27.94 kg/m2  SpO2 100%  Physical Exam  Nursing note and vitals reviewed. Constitutional: She is oriented to person, place, and time. She appears well-developed and well-nourished. No distress.  HENT:  Head: Normocephalic and atraumatic.  Eyes: EOM are normal.  Neck: Neck supple. No tracheal deviation present.  No C-spine tenderness.  Cardiovascular: Normal rate, regular rhythm and normal heart sounds.   Pulmonary/Chest: Effort normal and breath sounds normal. No respiratory distress. She has no wheezes. She has no rales.  Musculoskeletal: Normal range of motion.  Neurological: She is alert and oriented to person, place, and time.  CN 2-12 intact, no ataxia on finger to nose, no nystagmus, 5/5 strength throughout, no pronator drift, Romberg negative, normal gait.  Skin: Skin is warm and dry.  Psychiatric: She has a normal mood and affect. Her behavior is normal.    ED Course  Procedures (including critical care time)  DIAGNOSTIC STUDIES: Oxygen Saturation is 100% on RA, normal by my interpretation.    COORDINATION OF CARE: 9:31 PM- Discussed normal CT findings. Discussed plan to await results of diagnostic lab work. Pt advised of plan for treatment and pt agrees.  10:38 PM- Recheck with pt and she states that she is feeling much better. Her BP is now down to 130/67. Adbised pt not to take NTG for hypertension again. Pt states that she feels ready for discharge.  Medications  labetalol (NORMODYNE,TRANDATE) injection 20 mg (20 mg Intravenous Given 08/21/13 2210)  ondansetron (ZOFRAN-ODT) disintegrating tablet 4 mg (4 mg Oral Given 08/21/13 2141)  HYDROcodone-acetaminophen (NORCO/VICODIN) 5-325 MG per tablet 1 tablet (1 tablet Oral Given 08/21/13 2141)   Labs Review Labs Reviewed  COMPREHENSIVE METABOLIC PANEL -  Abnormal; Notable for the following:    Sodium 133 (*)    Calcium 10.6 (*)  Alkaline Phosphatase 156 (*)    GFR calc non Af Amer 59 (*)    GFR calc Af Amer 68 (*)    All other components within normal limits  CBC WITH DIFFERENTIAL  TROPONIN I   Imaging Review Ct Head Wo Contrast  08/21/2013   *RADIOLOGY REPORT*  Clinical Data: Headache and dizziness.  CT HEAD WITHOUT CONTRAST  Technique:  Contiguous axial images were obtained from the base of the skull through the vertex without contrast.  Comparison: MRI of the brain August 20, 2013.  Findings: The ventricles and sulci are normal for age.  No intraparenchymal hemorrhage, mass effect nor midline shift.  No acute large vascular territory infarcts.  No abnormal extra-axial fluid collections.  Basal cisterns are patent. Dense dural calcification along the anterior falx.  No skull fracture.  Visualized paranasal sinuses and mastoid aircells are well-aerated.  The included ocular globes and orbital contents are non-suspicious.  IMPRESSION: No acute intracranial process.  Normal noncontrast CT of the head for age, stable from prior examination.   Original Report Authenticated By: Awilda Metro   Mr Brain Wo Contrast  08/20/2013   CLINICAL DATA:  Poorly controlled hypertension. Dizziness with hypertensive urgency.  EXAM: MRI HEAD WITHOUT CONTRAST  TECHNIQUE: Multiplanar, multisequence MR imaging was performed. No intravenous contrast was administered.  COMPARISON:  CT head 08/19/2013.  FINDINGS: No evidence for acute infarction, hemorrhage, mass lesion, hydrocephalus, or extra-axial fluid. Normal cerebral volume. Subcortical greater than periventricular white matter signal abnormality, likely chronic microvascular ischemic change.  No posterior circulation white matter changes to suggest acute hypertensive encephalopathy. No microbleeds on MPGR sequence.  Flow voids are maintained in the carotid, basilar, and vertebral arteries. No foci of chronic  hemorrhage. No large vessel infarct.  Normal-appearing calvarium, skull base, and upper cervical region. Falx ossification. No midline abnormalities. Negative extracranial soft tissues, orbits, sinuses, and mastoids.  Compared with prior CT, there is no change.  IMPRESSION: No acute stroke or hemorrhage. No evidence for PRES/hypertensive encephalopathy. No evidence for posterior circulation infarction.  Subcortical greater than periventricular chronic white matter signal abnormality, likely chronic microvascular ischemic change related to hypertension.   Electronically Signed   By: Davonna Belling M.D.   On: 08/20/2013 11:49    MDM   1. Hypertension    Patient complains of elevated blood pressure associated with gradual onset headache. She was discharged from [facility] yesterday after one day stay for hypertensive urgency. States compliance with her medications. Denies thunderclap onset headache. She had CT scan MRI of her brain and were negative.  Blood pressure improved to 136/67. No evidence of endorgan damage. Creatinine normal. CT head unchanged.  Patient instructed to follow up with her PCP this week as already scheduled. Continue to take her blood pressure medications.   Date: 08/21/2013  Rate: 82  Rhythm: normal sinus rhythm  QRS Axis: normal  Intervals: normal  ST/T Wave abnormalities: normal  Conduction Disutrbances:none  Narrative Interpretation: inferior T waves now upright  Old EKG Reviewed: changed    Date: 08/21/2013  Rate: 60  Rhythm: normal sinus rhythm  QRS Axis: normal  Intervals: normal  ST/T Wave abnormalities: normal  Conduction Disutrbances:none  Narrative Interpretation:   Old EKG Reviewed: unchanged     I personally performed the services described in this documentation, which was scribed in my presence. The recorded information has been reviewed and is accurate.    Glynn Octave, MD 08/21/13 [phone]

## 2013-08-21 NOTE — ED Notes (Signed)
Pt c/o increased bp , admission from here Friday pm to [facility] d/c yesterday

## 2014-01-03 ENCOUNTER — Emergency Department (HOSPITAL_BASED_OUTPATIENT_CLINIC_OR_DEPARTMENT_OTHER): Payer: BC Managed Care – PPO

## 2014-01-03 ENCOUNTER — Emergency Department (HOSPITAL_BASED_OUTPATIENT_CLINIC_OR_DEPARTMENT_OTHER)
Admission: EM | Admit: 2014-01-03 | Discharge: 2014-01-03 | Disposition: A | Discharge status: Home / Self Care | Payer: BC Managed Care – PPO | Attending: Emergency Medicine | Admitting: Emergency Medicine

## 2014-01-03 ENCOUNTER — Encounter (HOSPITAL_BASED_OUTPATIENT_CLINIC_OR_DEPARTMENT_OTHER): Payer: Self-pay | Admitting: Emergency Medicine

## 2014-01-03 ENCOUNTER — Other Ambulatory Visit (HOSPITAL_COMMUNITY): Payer: Self-pay | Admitting: Cardiology

## 2014-01-03 DIAGNOSIS — Z87448 Personal history of other diseases of urinary system: Secondary | ICD-10-CM | POA: Insufficient documentation

## 2014-01-03 DIAGNOSIS — R079 Chest pain, unspecified: Secondary | ICD-10-CM

## 2014-01-03 DIAGNOSIS — Z79899 Other long term (current) drug therapy: Secondary | ICD-10-CM | POA: Insufficient documentation

## 2014-01-03 DIAGNOSIS — R0789 Other chest pain: Secondary | ICD-10-CM | POA: Insufficient documentation

## 2014-01-03 DIAGNOSIS — I10 Essential (primary) hypertension: Secondary | ICD-10-CM | POA: Insufficient documentation

## 2014-01-03 DIAGNOSIS — I059 Rheumatic mitral valve disease, unspecified: Secondary | ICD-10-CM | POA: Insufficient documentation

## 2014-01-03 DIAGNOSIS — Z7982 Long term (current) use of aspirin: Secondary | ICD-10-CM | POA: Insufficient documentation

## 2014-01-03 DIAGNOSIS — M129 Arthropathy, unspecified: Secondary | ICD-10-CM | POA: Insufficient documentation

## 2014-01-03 DIAGNOSIS — R110 Nausea: Secondary | ICD-10-CM | POA: Insufficient documentation

## 2014-01-03 DIAGNOSIS — Z8719 Personal history of other diseases of the digestive system: Secondary | ICD-10-CM | POA: Insufficient documentation

## 2014-01-03 DIAGNOSIS — R0602 Shortness of breath: Secondary | ICD-10-CM | POA: Insufficient documentation

## 2014-01-03 DIAGNOSIS — Z862 Personal history of diseases of the blood and blood-forming organs and certain disorders involving the immune mechanism: Secondary | ICD-10-CM | POA: Insufficient documentation

## 2014-01-03 DIAGNOSIS — IMO0001 Reserved for inherently not codable concepts without codable children: Secondary | ICD-10-CM | POA: Insufficient documentation

## 2014-01-03 DIAGNOSIS — N644 Mastodynia: Secondary | ICD-10-CM

## 2014-01-03 DIAGNOSIS — Z8639 Personal history of other endocrine, nutritional and metabolic disease: Secondary | ICD-10-CM | POA: Insufficient documentation

## 2014-01-03 LAB — CBC WITH DIFFERENTIAL/PLATELET
BASOS ABS: 0 10*3/uL (ref 0.0–0.1)
Basophils Relative: 1 % (ref 0–1)
Eosinophils Absolute: 0.2 10*3/uL (ref 0.0–0.7)
Eosinophils Relative: 5 % (ref 0–5)
HCT: 34.4 % — ABNORMAL LOW (ref 36.0–46.0)
Hemoglobin: 11.5 g/dL — ABNORMAL LOW (ref 12.0–15.0)
LYMPHS ABS: 1.7 10*3/uL (ref 0.7–4.0)
LYMPHS PCT: 50 % — AB (ref 12–46)
MCH: 28.9 pg (ref 26.0–34.0)
MCHC: 33.4 g/dL (ref 30.0–36.0)
MCV: 86.4 fL (ref 78.0–100.0)
Monocytes Absolute: 0.3 10*3/uL (ref 0.1–1.0)
Monocytes Relative: 8 % (ref 3–12)
NEUTROS ABS: 1.3 10*3/uL — AB (ref 1.7–7.7)
NEUTROS PCT: 36 % — AB (ref 43–77)
PLATELETS: 261 10*3/uL (ref 150–400)
RBC: 3.98 MIL/uL (ref 3.87–5.11)
RDW: 13.2 % (ref 11.5–15.5)
WBC: 3.5 10*3/uL — ABNORMAL LOW (ref 4.0–10.5)

## 2014-01-03 LAB — PRO B NATRIURETIC PEPTIDE: Pro B Natriuretic peptide (BNP): 93.8 pg/mL (ref 0–125)

## 2014-01-03 LAB — BASIC METABOLIC PANEL
BUN: 12 mg/dL (ref 6–23)
CALCIUM: 9.8 mg/dL (ref 8.4–10.5)
CO2: 22 meq/L (ref 19–32)
Chloride: 102 mEq/L (ref 96–112)
Creatinine, Ser: 0.9 mg/dL (ref 0.50–1.10)
GFR calc Af Amer: 78 mL/min — ABNORMAL LOW (ref >90)
GFR calc non Af Amer: 67 mL/min — ABNORMAL LOW (ref >90)
GLUCOSE: 110 mg/dL — AB (ref 70–99)
POTASSIUM: 4.1 meq/L (ref 3.7–5.3)
SODIUM: 137 meq/L (ref 137–147)

## 2014-01-03 LAB — TROPONIN I

## 2014-01-03 LAB — D-DIMER, QUANTITATIVE (NOT AT ARMC): D DIMER QUANT: 0.31 ug{FEU}/mL (ref 0.00–0.48)

## 2014-01-03 MED ORDER — ASPIRIN 81 MG PO CHEW: 81.0000 mg | CHEWABLE_TABLET | Freq: Every day | ORAL | Status: DC

## 2014-01-03 MED ORDER — TRAMADOL HCL 50 MG PO TABS: 50.0000 mg | ORAL_TABLET | Freq: Four times a day (QID) | ORAL | Status: DC | PRN

## 2014-01-03 NOTE — ED Notes (Signed)
Left sided cp intermittently for 1 week. Extends beneath axilla to left side of back.  Occ on right side.  Tender to touch.

## 2014-01-03 NOTE — Discharge Instructions (Signed)
Aspirin and Your Heart Aspirin affects the way your blood clots and helps "thin" the blood. Aspirin has many uses in heart disease. It may be used as a primary prevention to help reduce the risk of heart related events. It also can be used as a secondary measure to prevent more heart attacks or to prevent additional damage from blood clots.  ASPIRIN MAY HELP IF YOU:  Have had a heart attack or chest pain.  Have undergone open heart surgery such as CABG (Coronary Artery Bypass Surgery).  Have had coronary angioplasty with or without stents.  Have experienced a stroke or TIA (transient ischemic attack).  Have peripheral vascular disease (PAD).  Have chronic heart rhythm problems such as atrial fibrillation.  Are at risk for heart disease. BEFORE STARTING ASPIRIN Before you start taking aspirin, your caregiver will need to review your medical history. Many things will need to be taken into consideration, such as:  Smoking status.  Blood pressure.  Diabetes.  Gender.  Weight.  Cholesterol level. ASPIRIN DOSES  Aspirin should only be taken on the advice of your caregiver. Talk to your caregiver about how much aspirin you should take. Aspirin comes in different doses such as:  81 mg.  162 mg.  325 mg.  The aspirin dose you take may be affected by many factors, some of which include:  Your current medications, especially if your are taking blood-thinners or anti-platelet medicine.  Liver function.  Heart disease risk.  Age.  Aspirin comes in two forms:  Non-enteric-coated. This type of aspirin does not have a coating and is absorbed faster. Non-enteric coated aspirin is recommended for patients experiencing chest pain symptoms. This type of aspirin also comes in a chewable form.  Enteric-coated. This means the aspirin has a special coating that releases the medicine very slowly. Enteric-coated aspirin causes less stomach upset. This type of aspirin should not be chewed  or crushed. ASPIRIN SIDE EFFECTS Daily use of aspirin can increase your risk of serious side effects. Some of these include:  Increased bleeding. This can range from a cut that does not stop bleeding to more serious problems such as stomach bleeding or bleeding into the brain (Intracerebral bleeding).  Increased bruising.  Stomach upset.  An allergic reaction such as red, itchy skin.  Increased risk of bleeding when combined with non-steroidal anti-inflammatory medicine (NSAIDS).  Alcohol should be drank in moderation when taking aspirin. Alcohol can increase the risk of stomach bleeding when taken with aspirin.  Aspirin should not be given to children less than 29 years of age due to the association of Reye syndrome. Reye syndrome is a serious illness that can affect the brain and liver. Studies have linked Reye syndrome with aspirin use in children.  People that have nasal polyps have an increased risk of developing an aspirin allergy. SEEK MEDICAL CARE IF:   You develop an allergic reaction such as:  Hives.  Itchy skin.  Swelling of the lips, tongue or face.  You develop stomach pain.  You have unusual bleeding or bruising.  You have ringing in your ears. SEEK IMMEDIATE MEDICAL CARE IF:   You have severe chest pain, especially if the pain is crushing or pressure-like and spreads to the arms, back, neck, or jaw. THIS IS AN EMERGENCY. Do not wait to see if the pain will go away. Get medical help at once. Call your local emergency services (911 in the [place] DO NOT drive yourself to the hospital.  You have stroke-like symptoms  such as:  Loss of vision.  Difficulty talking.  Numbness or weakness on one side of your body.  Numbness or weakness in your arm or leg.  Not thinking clearly or feeling confused.  Your bowel movements are bloody, dark red or black in color.  You vomit or cough up blood.  You have blood in your urine.  You have shortness of breath,  coughing or wheezing. MAKE SURE YOU:   Understand these instructions.  Will monitor your condition.  Seek immediate medical care if necessary. Document Released: 10/16/2008 Document Revised: 02/28/2013 Document Reviewed: 10/16/2008 [facility] Patient Information 2014 [place], [place].  Chest Pain (Nonspecific) Chest pain has many causes. Your pain could be caused by something serious, such as a heart attack or a blood clot in the lungs. It could also be caused by something less serious, such as a chest bruise or a virus. Follow up with your doctor. More lab tests or other studies may be needed to find the cause of your pain. Most of the time, nonspecific chest pain will improve within 2 to 3 days of rest and mild pain medicine. HOME CARE  For chest bruises, you may put ice on the sore area for 15-20 minutes, 03-04 times a day. Do this only if it makes you feel better.  Put ice in a plastic bag.  Place a towel between the skin and the bag.  Rest for the next 2 to 3 days.  Go back to work if the pain improves.  See your doctor if the pain lasts longer than 1 to 2 weeks.  Only take medicine as told by your doctor.  Quit smoking if you smoke. GET HELP RIGHT AWAY IF:   There is more pain or pain that spreads to the arm, neck, jaw, back, or belly (abdomen).  You have shortness of breath.  You cough more than usual or cough up blood.  You have very bad back or belly pain, feel sick to your stomach (nauseous), or throw up (vomit).  You have very bad weakness.  You pass out (faint).  You have a fever. Any of these problems may be serious and may be an emergency. Do not wait to see if the problems will go away. Get medical help right away. Call your local emergency services 911 in [place] Do not drive yourself to the hospital. MAKE SURE YOU:   Understand these instructions.  Will watch this condition.  Will get help right away if you or your child is not doing well or gets  worse. Document Released: 04/21/2008 Document Revised: 01/26/2012 Document Reviewed: 04/21/2008 [facility] Patient Information 2014 [place], [place].  Followup with your regular Dr. in the next few days. For reevaluation of the left breast discomfort. As stated no mass felt here but followup mammogram may be important. Also make an appointment to followup with your cardiologist for further evaluation of the chest pain. Today's workup without any significant findings. Return for any newer worse symptoms.

## 2014-01-03 NOTE — ED Notes (Signed)
Called me in the room. States she is having heaviness in her breast. Monitor unchanged NSR. Troponin ordered.

## 2014-01-03 NOTE — ED Provider Notes (Signed)
CSN: [id number]     Arrival date & time 01/03/14  1028 History   First MD Initiated Contact with Patient 01/03/14 1142     Chief Complaint  Patient presents with  . Chest Pain     (Consider location/radiation/quality/duration/timing/severity/associated sxs/prior Treatment) Patient is a 63 y.o. female presenting with chest pain. The history is provided by the patient.  Chest Pain Associated symptoms: nausea and shortness of breath   Associated symptoms: no abdominal pain, no cough, no fever, no headache, no palpitations and not vomiting    agent with complaint of left-sided the chest pain has been constant for a week but waxes and wanes. Radiates into the left axilla area and around the left side of the back. Patient states that feels as if the tenderness is in the breast but she's not able to palpate any specific area. Some inner quadrant tenderness to her and some lateral tenderness. She states some questionable mass in the lateral area. Associated with some nausea but no vomiting no diarrhea associated with some shortness of breath. No leg swelling. No coronary history other than history of mitral valve prolapse. Patient does have a primary care Dr. and is followed by cardiology. The pain is sharp at times in a constant ache. And described as a soreness. Currently it is 6/10.  Past Medical History  Diagnosis Date  . Arthritis   . Fibromyalgia   . Mitral valve prolapse   . Hypertension   . Hyperparathyroidism   . Acute renal disease   . Hyperparathyroidism   . GERD (gastroesophageal reflux disease)    Past Surgical History  Procedure Laterality Date  . Abdominal hysterectomy    . Nissan fundiplication    . Knee surgery    . Cholecystectomy    . Liver biopsy     Family History  Problem Relation Age of Onset  . Hypertension Mother   . Hypertension Father   . Stroke Mother   . Stroke Father   . Diabetes Mother   . Diabetes Sister   . Diabetes Brother    History  Substance  Use Topics  . Smoking status: Never Smoker   . Smokeless tobacco: Never Used  . Alcohol Use: No   OB History   Grav Para Term Preterm Abortions TAB SAB Ect Mult Living                 Review of Systems  Constitutional: Negative for fever.  HENT: Negative for congestion.   Eyes: Negative for pain.  Respiratory: Positive for shortness of breath. Negative for cough.   Cardiovascular: Positive for chest pain. Negative for palpitations and leg swelling.  Gastrointestinal: Positive for nausea. Negative for vomiting and abdominal pain.  Genitourinary: Negative for dysuria.  Skin: Negative for rash.  Neurological: Negative for headaches.  Hematological: Does not bruise/bleed easily.  Psychiatric/Behavioral: Negative for confusion.      Allergies  Augmentin; Codeine; and Tetracyclines & related  Home Medications   Current Outpatient Rx  Name  Route  Sig  Dispense  Refill  . BIOTIN PO   Oral   Take by mouth.         . eszopiclone (LUNESTA) 2 MG TABS tablet   Oral   Take 2 mg by mouth at bedtime as needed for sleep. Take immediately before bedtime         . labetalol (NORMODYNE) 100 MG tablet   Oral   Take 100 mg by mouth 2 (two) times daily.         Marland Kitchen  aspirin 81 MG chewable tablet   Oral   Chew 1 tablet (81 mg total) by mouth daily.   30 tablet   0   . aspirin EC 81 MG tablet   Oral   Take 81 mg by mouth daily.           . cyclobenzaprine (FLEXERIL) 10 MG tablet   Oral   Take 10 mg by mouth 2 (two) times daily as needed for muscle spasms.          . DULoxetine (CYMBALTA) 60 MG capsule   Oral   Take 120 mg by mouth daily.          Marland Kitchen eplerenone (INSPRA) 50 MG tablet   Oral   Take 50 mg by mouth 2 (two) times daily.         . fexofenadine (ALLEGRA) 180 MG tablet   Oral   Take 180 mg by mouth daily as needed (allergies). For allergies         . Multiple Vitamin (MULITIVITAMIN WITH MINERALS) TABS   Oral   Take 1 tablet by mouth daily.           Marland Kitchen olmesartan (BENICAR) 20 MG tablet   Oral   Take 20 mg by mouth daily.         Marland Kitchen OVER THE COUNTER MEDICATION   Oral   Take 1 tablet by mouth 2 (two) times daily.         Marland Kitchen oxyCODONE-acetaminophen (PERCOCET) 10-325 MG per tablet   Oral   Take 1 tablet by mouth every 4 (four) hours as needed for pain.         . potassium chloride SA (K-DUR,KLOR-CON) 20 MEQ tablet   Oral   Take 40 mEq by mouth 2 (two) times daily.          . promethazine (PHENERGAN) 25 MG tablet   Oral   Take 25 mg by mouth every 6 (six) hours as needed for nausea. For nausea         . traMADol (ULTRAM) 50 MG tablet   Oral   Take 1 tablet (50 mg total) by mouth every 6 (six) hours as needed.   15 tablet   0   . Vitamin D, Ergocalciferol, (DRISDOL) 50000 UNITS CAPS   Oral   Take 50,000 Units by mouth every 7 (seven) days. Take on Friday         . zolpidem (AMBIEN) 10 MG tablet   Oral   Take 10 mg by mouth at bedtime.           BP 158/93  Pulse 57  Temp(Src) 99.4 F (37.4 C) (Oral)  Resp 19  SpO2 100% Physical Exam  Nursing note and vitals reviewed. Constitutional: She is oriented to person, place, and time. She appears well-developed and well-nourished. No distress.  HENT:  Head: Normocephalic and atraumatic.  Mouth/Throat: Oropharynx is clear and moist.  Eyes: Conjunctivae and EOM are normal. Pupils are equal, round, and reactive to light.  Neck: Normal range of motion. Neck supple.  Cardiovascular: Normal rate, regular rhythm and normal heart sounds.   No murmur heard. Pulmonary/Chest: Effort normal and breath sounds normal. No respiratory distress. She has no wheezes. She has no rales. She exhibits no tenderness.  Palpation of left breast without any axillary adenopathy also no breast masses palpated.  Abdominal: Soft. Bowel sounds are normal. There is no tenderness.  Musculoskeletal: She exhibits no edema.  Neurological: She is alert and oriented  to person, place, and time.  No cranial nerve deficit. She exhibits normal muscle tone. Coordination normal.  Skin: No erythema.    ED Course  Procedures (including critical care time) Labs Review Labs Reviewed  CBC WITH DIFFERENTIAL - Abnormal; Notable for the following:    WBC 3.5 (*)    Hemoglobin 11.5 (*)    HCT 34.4 (*)    Neutrophils Relative % 36 (*)    Neutro Abs 1.3 (*)    Lymphocytes Relative 50 (*)    All other components within normal limits  BASIC METABOLIC PANEL - Abnormal; Notable for the following:    Glucose, Bld 110 (*)    GFR calc non Af Amer 67 (*)    GFR calc Af Amer 78 (*)    All other components within normal limits  TROPONIN I  PRO B NATRIURETIC PEPTIDE  D-DIMER, QUANTITATIVE   Results for orders placed during the hospital encounter of 01/03/14  TROPONIN I      Result Value Ref Range   Troponin I <0.30  <0.30 ng/mL  CBC WITH DIFFERENTIAL      Result Value Ref Range   WBC 3.5 (*) 4.0 - 10.5 K/uL   RBC 3.98  3.87 - 5.11 MIL/uL   Hemoglobin 11.5 (*) 12.0 - 15.0 g/dL   HCT 16.1 (*) 09.6 - 04.5 %   MCV 86.4  78.0 - 100.0 fL   MCH 28.9  26.0 - 34.0 pg   MCHC 33.4  30.0 - 36.0 g/dL   RDW 40.9  81.1 - 91.4 %   Platelets 261  150 - 400 K/uL   Neutrophils Relative % 36 (*) 43 - 77 %   Neutro Abs 1.3 (*) 1.7 - 7.7 K/uL   Lymphocytes Relative 50 (*) 12 - 46 %   Lymphs Abs 1.7  0.7 - 4.0 K/uL   Monocytes Relative 8  3 - 12 %   Monocytes Absolute 0.3  0.1 - 1.0 K/uL   Eosinophils Relative 5  0 - 5 %   Eosinophils Absolute 0.2  0.0 - 0.7 K/uL   Basophils Relative 1  0 - 1 %   Basophils Absolute 0.0  0.0 - 0.1 K/uL  BASIC METABOLIC PANEL      Result Value Ref Range   Sodium 137  137 - 147 mEq/L   Potassium 4.1  3.7 - 5.3 mEq/L   Chloride 102  96 - 112 mEq/L   CO2 22  19 - 32 mEq/L   Glucose, Bld 110 (*) 70 - 99 mg/dL   BUN 12  6 - 23 mg/dL   Creatinine, Ser 7.82  0.50 - 1.10 mg/dL   Calcium 9.8  8.4 - 95.6 mg/dL   GFR calc non Af Amer 67 (*) >90 mL/min   GFR calc Af Amer 78  (*) >90 mL/min  PRO B NATRIURETIC PEPTIDE      Result Value Ref Range   Pro B Natriuretic peptide (BNP) 93.8  0 - 125 pg/mL  D-DIMER, QUANTITATIVE      Result Value Ref Range   D-Dimer, Quant 0.31  0.00 - 0.48 ug/mL-FEU    Imaging Review Dg Chest 2 View  01/03/2014   CLINICAL DATA:  Chest pain  EXAM: CHEST  2 VIEW  COMPARISON:  Chest radiograph and chest CT September 16, 2012  FINDINGS: Lungs are clear. Heart size and pulmonary vascularity are normal. No adenopathy. There is degenerative change in the thoracic spine. .  IMPRESSION: No edema or consolidation.  Electronically Signed   By: Bretta BangWilliam  Woodruff M.D.   On: 01/03/2014 12:29    EKG Interpretation    Date/Time:  Tuesday January 03 2014 10:38:36 EST Ventricular Rate:  67 PR Interval:  180 QRS Duration: 82 QT Interval:  368 QTC Calculation: 388 R Axis:   35 Text Interpretation:  Normal sinus rhythm Normal ECG No significant change since last tracing Confirmed by Anniece Bleiler  MD, Worthy Boschert ([id number]) on 01/03/2014 11:46:46 AM            MDM   Final diagnoses:  Chest pain  Pain of left breast    Workup for the left-sided chest pain that has been present for a week. Negative d-dimer negative not consistent with pulmonary embolism. Troponin negative EKG without any acute changes. Chest x-ray negative for pneumonia pneumothorax or pulmonary edema. Patient's BNP is not elevated. Patient with discomfort in the left breast no palpable mass. However followup with her primary care Dr. for consideration for mammogram important. Vulva discomfort unlikely to be a breast tumor. Also patient has a cardiologist to recommend followup with the cardiologist. Patient is has no known cardiac disease other than mitral valve prolapse. Will start a baby aspirin a day and tramadol for the breast pain.   Shelda JakesScott W. Sharron Simpson, MD 01/03/14 [phone]

## 2014-01-04 ENCOUNTER — Other Ambulatory Visit: Payer: Self-pay | Admitting: Internal Medicine

## 2014-01-04 DIAGNOSIS — N644 Mastodynia: Secondary | ICD-10-CM

## 2014-01-05 ENCOUNTER — Ambulatory Visit
Admission: RE | Admit: 2014-01-05 | Discharge: 2014-01-05 | Disposition: A | Discharge status: Home / Self Care | Payer: BC Managed Care – PPO | Source: Ambulatory Visit | Attending: Internal Medicine | Admitting: Internal Medicine

## 2014-01-05 DIAGNOSIS — N644 Mastodynia: Secondary | ICD-10-CM

## 2014-07-31 ENCOUNTER — Inpatient Hospital Stay (HOSPITAL_BASED_OUTPATIENT_CLINIC_OR_DEPARTMENT_OTHER)
Admission: EM | Admit: 2014-07-31 | Discharge: 2014-08-08 | DRG: 917 | Payer: BC Managed Care – PPO | Attending: Internal Medicine | Admitting: Internal Medicine

## 2014-07-31 ENCOUNTER — Encounter (HOSPITAL_BASED_OUTPATIENT_CLINIC_OR_DEPARTMENT_OTHER): Payer: Self-pay | Admitting: Emergency Medicine

## 2014-07-31 DIAGNOSIS — Z7982 Long term (current) use of aspirin: Secondary | ICD-10-CM

## 2014-07-31 DIAGNOSIS — IMO0001 Reserved for inherently not codable concepts without codable children: Secondary | ICD-10-CM | POA: Diagnosis present

## 2014-07-31 DIAGNOSIS — T50902A Poisoning by unspecified drugs, medicaments and biological substances, intentional self-harm, initial encounter: Secondary | ICD-10-CM | POA: Diagnosis present

## 2014-07-31 DIAGNOSIS — F332 Major depressive disorder, recurrent severe without psychotic features: Secondary | ICD-10-CM | POA: Diagnosis present

## 2014-07-31 DIAGNOSIS — E876 Hypokalemia: Secondary | ICD-10-CM

## 2014-07-31 DIAGNOSIS — I059 Rheumatic mitral valve disease, unspecified: Secondary | ICD-10-CM | POA: Diagnosis present

## 2014-07-31 DIAGNOSIS — K5900 Constipation, unspecified: Secondary | ICD-10-CM | POA: Diagnosis present

## 2014-07-31 DIAGNOSIS — S61511A Laceration without foreign body of right wrist, initial encounter: Secondary | ICD-10-CM

## 2014-07-31 DIAGNOSIS — T50902D Poisoning by unspecified drugs, medicaments and biological substances, intentional self-harm, subsequent encounter: Secondary | ICD-10-CM

## 2014-07-31 DIAGNOSIS — S61512A Laceration without foreign body of left wrist, initial encounter: Secondary | ICD-10-CM

## 2014-07-31 DIAGNOSIS — I959 Hypotension, unspecified: Secondary | ICD-10-CM | POA: Diagnosis present

## 2014-07-31 DIAGNOSIS — R42 Dizziness and giddiness: Secondary | ICD-10-CM

## 2014-07-31 DIAGNOSIS — R001 Bradycardia, unspecified: Secondary | ICD-10-CM

## 2014-07-31 DIAGNOSIS — T465X1A Poisoning by other antihypertensive drugs, accidental (unintentional), initial encounter: Principal | ICD-10-CM | POA: Diagnosis present

## 2014-07-31 DIAGNOSIS — M542 Cervicalgia: Secondary | ICD-10-CM | POA: Diagnosis present

## 2014-07-31 DIAGNOSIS — T50901A Poisoning by unspecified drugs, medicaments and biological substances, accidental (unintentional), initial encounter: Secondary | ICD-10-CM | POA: Diagnosis present

## 2014-07-31 DIAGNOSIS — I160 Hypertensive urgency: Secondary | ICD-10-CM

## 2014-07-31 DIAGNOSIS — K219 Gastro-esophageal reflux disease without esophagitis: Secondary | ICD-10-CM | POA: Diagnosis present

## 2014-07-31 DIAGNOSIS — R51 Headache: Secondary | ICD-10-CM | POA: Diagnosis present

## 2014-07-31 DIAGNOSIS — S61509A Unspecified open wound of unspecified wrist, initial encounter: Secondary | ICD-10-CM | POA: Diagnosis present

## 2014-07-31 DIAGNOSIS — T50904D Poisoning by unspecified drugs, medicaments and biological substances, undetermined, subsequent encounter: Secondary | ICD-10-CM

## 2014-07-31 DIAGNOSIS — N179 Acute kidney failure, unspecified: Secondary | ICD-10-CM

## 2014-07-31 DIAGNOSIS — N170 Acute kidney failure with tubular necrosis: Secondary | ICD-10-CM | POA: Diagnosis present

## 2014-07-31 DIAGNOSIS — N182 Chronic kidney disease, stage 2 (mild): Secondary | ICD-10-CM | POA: Diagnosis present

## 2014-07-31 DIAGNOSIS — F411 Generalized anxiety disorder: Secondary | ICD-10-CM | POA: Diagnosis present

## 2014-07-31 DIAGNOSIS — I129 Hypertensive chronic kidney disease with stage 1 through stage 4 chronic kidney disease, or unspecified chronic kidney disease: Secondary | ICD-10-CM | POA: Diagnosis present

## 2014-07-31 DIAGNOSIS — E213 Hyperparathyroidism, unspecified: Secondary | ICD-10-CM | POA: Diagnosis present

## 2014-07-31 DIAGNOSIS — I498 Other specified cardiac arrhythmias: Secondary | ICD-10-CM | POA: Diagnosis present

## 2014-07-31 DIAGNOSIS — M797 Fibromyalgia: Secondary | ICD-10-CM

## 2014-07-31 DIAGNOSIS — M129 Arthropathy, unspecified: Secondary | ICD-10-CM | POA: Diagnosis present

## 2014-07-31 HISTORY — DX: Anemia, unspecified: D64.9

## 2014-07-31 HISTORY — DX: Other chronic pain: G89.29

## 2014-07-31 HISTORY — DX: Headache: R51

## 2014-07-31 HISTORY — DX: Dorsalgia, unspecified: M54.9

## 2014-07-31 HISTORY — DX: Unspecified asthma, uncomplicated: J45.909

## 2014-07-31 HISTORY — DX: Depression, unspecified: F32.A

## 2014-07-31 HISTORY — DX: Pneumonia, unspecified organism: J18.9

## 2014-07-31 HISTORY — DX: Headache, unspecified: R51.9

## 2014-07-31 HISTORY — DX: Pure hypercholesterolemia, unspecified: E78.00

## 2014-07-31 HISTORY — DX: Major depressive disorder, single episode, unspecified: F32.9

## 2014-07-31 HISTORY — DX: Other specified disorders of bone density and structure, unspecified site: M85.80

## 2014-07-31 HISTORY — DX: Chronic kidney disease, stage 2 (mild): N18.2

## 2014-07-31 HISTORY — DX: Personal history of other diseases of the digestive system: Z87.19

## 2014-07-31 HISTORY — DX: Unspecified atrial fibrillation: I48.91

## 2014-07-31 HISTORY — DX: Anxiety disorder, unspecified: F41.9

## 2014-07-31 LAB — CBC WITH DIFFERENTIAL/PLATELET
Basophils Absolute: 0 10*3/uL (ref 0.0–0.1)
Basophils Relative: 1 % (ref 0–1)
EOS PCT: 1 % (ref 0–5)
Eosinophils Absolute: 0.1 10*3/uL (ref 0.0–0.7)
HEMATOCRIT: 34.2 % — AB (ref 36.0–46.0)
Hemoglobin: 11.6 g/dL — ABNORMAL LOW (ref 12.0–15.0)
LYMPHS ABS: 1.1 10*3/uL (ref 0.7–4.0)
LYMPHS PCT: 24 % (ref 12–46)
MCH: 29.4 pg (ref 26.0–34.0)
MCHC: 33.9 g/dL (ref 30.0–36.0)
MCV: 86.6 fL (ref 78.0–100.0)
MONO ABS: 0.2 10*3/uL (ref 0.1–1.0)
Monocytes Relative: 3 % (ref 3–12)
Neutro Abs: 3.3 10*3/uL (ref 1.7–7.7)
Neutrophils Relative %: 71 % (ref 43–77)
Platelets: 248 10*3/uL (ref 150–400)
RBC: 3.95 MIL/uL (ref 3.87–5.11)
RDW: 13.2 % (ref 11.5–15.5)
WBC: 4.7 10*3/uL (ref 4.0–10.5)

## 2014-07-31 LAB — CBC
HEMATOCRIT: 33.4 % — AB (ref 36.0–46.0)
Hemoglobin: 11.4 g/dL — ABNORMAL LOW (ref 12.0–15.0)
MCH: 28.9 pg (ref 26.0–34.0)
MCHC: 34.1 g/dL (ref 30.0–36.0)
MCV: 84.6 fL (ref 78.0–100.0)
Platelets: 234 10*3/uL (ref 150–400)
RBC: 3.95 MIL/uL (ref 3.87–5.11)
RDW: 13.6 % (ref 11.5–15.5)
WBC: 4.5 10*3/uL (ref 4.0–10.5)

## 2014-07-31 LAB — SALICYLATE LEVEL: Salicylate Lvl: <2 mg/dL — ABNORMAL LOW (ref 2.8–20.0)

## 2014-07-31 LAB — ETHANOL: Alcohol, Ethyl (B): <11 mg/dL (ref 0–11)

## 2014-07-31 LAB — COMPREHENSIVE METABOLIC PANEL
ALBUMIN: 3.8 g/dL (ref 3.5–5.2)
ALK PHOS: 126 U/L — AB (ref 39–117)
ALT: 20 U/L (ref 0–35)
ANION GAP: 16 — AB (ref 5–15)
AST: 23 U/L (ref 0–37)
BUN: 25 mg/dL — AB (ref 6–23)
CHLORIDE: 99 meq/L (ref 96–112)
CO2: 23 mEq/L (ref 19–32)
CREATININE: 2.3 mg/dL — AB (ref 0.50–1.10)
Calcium: 10.5 mg/dL (ref 8.4–10.5)
GFR calc Af Amer: 25 mL/min — ABNORMAL LOW (ref >90)
GFR calc non Af Amer: 21 mL/min — ABNORMAL LOW (ref >90)
Glucose, Bld: 134 mg/dL — ABNORMAL HIGH (ref 70–99)
POTASSIUM: 3.4 meq/L — AB (ref 3.7–5.3)
Sodium: 138 mEq/L (ref 137–147)
TOTAL PROTEIN: 7.4 g/dL (ref 6.0–8.3)
Total Bilirubin: 0.6 mg/dL (ref 0.3–1.2)

## 2014-07-31 LAB — RAPID URINE DRUG SCREEN, HOSP PERFORMED
Amphetamines: POSITIVE — AB
BENZODIAZEPINES: NOT DETECTED
Barbiturates: NOT DETECTED
Cocaine: NOT DETECTED
OPIATES: NOT DETECTED
TETRAHYDROCANNABINOL: NOT DETECTED

## 2014-07-31 LAB — GLUCOSE, CAPILLARY: GLUCOSE-CAPILLARY: 103 mg/dL — AB (ref 70–99)

## 2014-07-31 LAB — CREATININE, SERUM
Creatinine, Ser: 1.86 mg/dL — ABNORMAL HIGH (ref 0.50–1.10)
GFR calc Af Amer: 32 mL/min — ABNORMAL LOW (ref >90)
GFR, EST NON AFRICAN AMERICAN: 28 mL/min — AB (ref >90)

## 2014-07-31 LAB — ACETAMINOPHEN LEVEL

## 2014-07-31 MED ORDER — SODIUM CHLORIDE 0.9 % IV BOLUS (SEPSIS)
1000.0000 mL | Freq: Once | INTRAVENOUS | Status: AC
  Administered 2014-07-31: 1000 mL via INTRAVENOUS

## 2014-07-31 MED ORDER — PANTOPRAZOLE SODIUM 40 MG IV SOLR
40.0000 mg | INTRAVENOUS | Status: DC
  Administered 2014-07-31 – 2014-08-01 (×2): 40 mg via INTRAVENOUS
  Filled 2014-07-31 (×5): qty 40

## 2014-07-31 MED ORDER — TETANUS-DIPHTH-ACELL PERTUSSIS 5-2.5-18.5 LF-MCG/0.5 IM SUSP
0.5000 mL | Freq: Once | INTRAMUSCULAR | Status: AC
  Administered 2014-07-31: 0.5 mL via INTRAMUSCULAR
  Filled 2014-07-31: qty 0.5

## 2014-07-31 MED ORDER — HEPARIN SODIUM (PORCINE) 5000 UNIT/ML IJ SOLN
5000.0000 [IU] | Freq: Three times a day (TID) | INTRAMUSCULAR | Status: DC
  Administered 2014-07-31 – 2014-08-08 (×23): 5000 [IU] via SUBCUTANEOUS
  Filled 2014-07-31 (×28): qty 1

## 2014-07-31 MED ORDER — SODIUM CHLORIDE 0.9 % IV SOLN: 250.0000 mL | INTRAVENOUS | Status: DC | PRN

## 2014-07-31 MED ORDER — INFLUENZA VAC SPLIT QUAD 0.5 ML IM SUSY
0.5000 mL | PREFILLED_SYRINGE | INTRAMUSCULAR | Status: AC
  Administered 2014-08-01: 0.5 mL via INTRAMUSCULAR
  Filled 2014-07-31: qty 0.5

## 2014-07-31 MED ORDER — SODIUM CHLORIDE 0.9 % IV SOLN
INTRAVENOUS | Status: DC
  Administered 2014-07-31 – 2014-08-02 (×3): via INTRAVENOUS

## 2014-07-31 NOTE — ED Notes (Signed)
Pt is suicidal.  Pt overdosed on her home medications.  Pt has lacerations to both wrists from 2 am.  Pt states she feels overwhelmed.

## 2014-07-31 NOTE — ED Notes (Signed)
Pt care transferred to [facility] staff. Pt and grand daughter aware of pending transport and inpatient admit to ICU @ [facility].

## 2014-07-31 NOTE — ED Notes (Signed)
Family at bedside.  Pt getting undressed, placed in burgundy scrubs, all belongings removed and given to granddaughter.

## 2014-07-31 NOTE — ED Notes (Signed)
[facility] notified of [id number]

## 2014-07-31 NOTE — ED Notes (Addendum)
Pt first seen by this rn. This RN calls poison control. Pt has bag of empty pill bottles, states these are the meds that she took last night. labetolol  tabs, amlodipine , brintellix , benicar , duloxetine . Poison control given pt demographics, will fax treatment recommendations to me. ekg in progress, iv access being obtained, with labs.

## 2014-07-31 NOTE — H&P (Signed)
PULMONARY / CRITICAL CARE MEDICINE   Name: Christina Vang MRN: [id number] DOB: 1951/06/21    ADMISSION DATE:  07/31/2014 CONSULTATION DATE:  07/31/2014  REFERRING MD :  EDP  CHIEF COMPLAINT:  Multiple medication overdose  INITIAL PRESENTATION: 63 y.o. F brought to [facility] on 9/14 after she overdosed on multiple medications including Labetalol, Norvasc, Olmesartan, Cymbalta, Brintellix as part of a suicide attempt.  In ED, pt was hypotensive and bradycardic, request made for transfer to [facility] ICU.  PCCM was consulted.  STUDIES:  None  SIGNIFICANT EVENTS: 9/14 - presented to [facility] after suicide attempt by overdose on multiple meds, transferred to [facility] ICU.   HISTORY OF PRESENT ILLNESS:  Christina Vang is a 63 y.o. F with PMH as outlined below and who presented to [facility] on 9/14 after she overdosed on several of her medications and slit her bilateral wrists several times the night prior as part of a suicide attempt.  Meds included roughly 60 pills of Labetalol, 60 pills of Norvasc, 30 of Olmesartan, roughly 30 of Cymbalta, roughly 30 of Brintellix.  She states that she has just felt overwhelmed lately and hasn't felt any worth in living any longer. 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  She is unsure of what exactly caused her to feel this way, but apparently she has felt so for quite some time now.  Of note, this is her 3rd suicide attempt (prior ones in April and July 2015).  Prior attempts were similar in nature, with overdose of several medications. She states that this time, she took the above mentioned pills at roughly 1am and proceeded to go to sleep thinking she would not wake up again.  Later that morning, she did in fact wake up and regretted her decision.  Her grand-daughter came home later that morning and noticed that pt was very weak.  After some questioning, pt informed her of what had happened, and grand-daughter proceeded to  take her to [facility] for further evaluation. While there, she was hypotensive and bradycardic.  Poison control was contacted and suggested supportive care. Request was made for transfer to [facility] ICU and PCCM was consulted.  PAST MEDICAL HISTORY :  Past Medical History  Diagnosis Date  . Arthritis   . Fibromyalgia   . Mitral valve prolapse   . Hypertension   . Hyperparathyroidism   . Acute renal disease   . Hyperparathyroidism   . GERD (gastroesophageal reflux disease)   . Depression    Past Surgical History  Procedure Laterality Date  . Abdominal hysterectomy    . Nissan fundiplication    . Knee surgery    . Cholecystectomy    . Liver biopsy     Prior to Admission medications   Medication Sig Start Date End Date Taking? Authorizing Provider  amLODipine (NORVASC) 5 MG tablet Take 5 mg by mouth daily.   Yes Historical Provider, MD  atorvastatin (LIPITOR) 20 MG tablet Take 20 mg by mouth daily.   Yes Historical Provider, MD  omega-3 acid ethyl esters (LOVAZA) 1 G capsule Take 1 g by mouth 2 (two) times daily.   Yes Historical Provider, MD  Omeprazole-Sodium Bicarbonate (ZEGERID) 20-1100 MG CAPS capsule Take 1 capsule by mouth daily before breakfast.   Yes Historical Provider, MD  Pseudoephedrine-DM-GG (ROBITUSSIN CF PO) Take by mouth.   Yes Historical Provider, MD  spironolactone (ALDACTONE) 50 MG tablet Take  50 mg by mouth daily.   Yes Historical Provider, MD  Vortioxetine HBr (BRINTELLIX) 20 MG TABS Take by mouth daily.   Yes Historical Provider, MD  aspirin 81 MG chewable tablet Chew 1 tablet (81 mg total) by mouth daily. 01/03/14   Vanetta Mulders, MD  aspirin EC 81 MG tablet Take 81 mg by mouth daily.      Historical Provider, MD  BIOTIN PO Take by mouth.    Historical Provider, MD  cyclobenzaprine (FLEXERIL) 10 MG tablet Take 10 mg by mouth 2 (two) times daily as needed for muscle spasms.     Historical Provider, MD  DULoxetine (CYMBALTA) 60 MG capsule Take 120 mg by  mouth daily.     Historical Provider, MD  eplerenone (INSPRA) 50 MG tablet Take 50 mg by mouth 2 (two) times daily.    Historical Provider, MD  eszopiclone (LUNESTA) 2 MG TABS tablet Take 2 mg by mouth at bedtime as needed for sleep. Take immediately before bedtime    Historical Provider, MD  fexofenadine (ALLEGRA) 180 MG tablet Take 180 mg by mouth daily as needed (allergies). For allergies    Historical Provider, MD  labetalol (NORMODYNE) 100 MG tablet Take 100 mg by mouth 2 (two) times daily.    Historical Provider, MD  Multiple Vitamin (MULITIVITAMIN WITH MINERALS) TABS Take 1 tablet by mouth daily.     Historical Provider, MD  olmesartan (BENICAR) 20 MG tablet Take 20 mg by mouth daily.    Historical Provider, MD  OVER THE COUNTER MEDICATION Take 1 tablet by mouth 2 (two) times daily.    Historical Provider, MD  oxyCODONE-acetaminophen (PERCOCET) 10-325 MG per tablet Take 1 tablet by mouth every 4 (four) hours as needed for pain.    Historical Provider, MD  potassium chloride SA (K-DUR,KLOR-CON) 20 MEQ tablet Take 40 mEq by mouth 2 (two) times daily.     Historical Provider, MD  promethazine (PHENERGAN) 25 MG tablet Take 25 mg by mouth every 6 (six) hours as needed for nausea. For nausea    Historical Provider, MD  traMADol (ULTRAM) 50 MG tablet Take 1 tablet (50 mg total) by mouth every 6 (six) hours as needed. 01/03/14   Vanetta Mulders, MD  Vitamin D, Ergocalciferol, (DRISDOL) 50000 UNITS CAPS Take 50,000 Units by mouth every 7 (seven) days. Take on Friday    Historical Provider, MD  zolpidem (AMBIEN) 10 MG tablet Take 10 mg by mouth at bedtime.     Historical Provider, MD   Allergies  Allergen Reactions  . Augmentin [Amoxicillin-Pot Clavulanate] Nausea And Vomiting  . Codeine Nausea And Vomiting  . Tetracyclines & Related Nausea And Vomiting    FAMILY HISTORY:  Family History  Problem Relation Age of Onset  . Hypertension Mother   . Hypertension Father   . Stroke Mother   . Stroke  Father   . Diabetes Mother   . Diabetes Sister   . Diabetes Brother    SOCIAL HISTORY:  reports that she has never smoked. She has never used smokeless tobacco. She reports that she does not drink alcohol or use illicit drugs.  REVIEW OF SYSTEMS:   All negative; except for those that are bolded, which indicate positives.  Constitutional: weight loss, weight gain, night sweats, fevers, chills, fatigue, weakness.  HEENT: headaches, sore throat, sneezing, nasal congestion, post nasal drip, difficulty swallowing, tooth/dental problems, visual complaints, visual changes, ear aches. Neuro: difficulty with speech, weakness, numbness, ataxia. CV:  chest pain, orthopnea, PND, swelling in  lower extremities, dizziness, palpitations, syncope.  Resp: cough, hemoptysis, dyspnea, wheezing. GI  heartburn, indigestion, abdominal pain, nausea, vomiting, diarrhea, constipation, change in bowel habits, loss of appetite, hematemesis, melena, hematochezia.  GU: dysuria, change in color of urine, urgency or frequency, flank pain, hematuria. MSK: joint pain or swelling, decreased range of motion. Psych: change in mood or affect, depression, anxiety, suicidal ideations, homicidal ideations. Skin: rash, itching, bruising.   SUBJECTIVE: Denies any chest pain, difficulty breathing, N/V/D, abdominal pain.  VITAL SIGNS: Temp:  [97.6 F (36.4 C)-98 F (36.7 C)] 97.6 F (36.4 C) (09/14 1655) Pulse Rate:  [46-58] 57 (09/14 1700) Resp:  [14-35] 35 (09/14 1700) BP: (78-90)/(44-52) 87/48 mmHg (09/14 1700) SpO2:  [94 %-100 %] 98 % (09/14 1700) Weight:  [86.2 kg (190 lb 0.6 oz)] 86.2 kg (190 lb 0.6 oz) (09/14 1700) HEMODYNAMICS:   VENTILATOR SETTINGS:   INTAKE / OUTPUT: Intake/Output     09/13 0701 - 09/14 0700 09/14 0701 - 09/15 0700   Total Output   0   Net   0         PHYSICAL EXAMINATION: General: WDWN female, in NAD. Neuro: A&O x 3, non-focal.  HEENT: [place]/AT. PERRL, sclerae anicteric. Cardiovascular:  Bradycardic, regular, no M/R/G. Lungs: Respirations even and unlabored.  CTA bilaterally, No W/R/R.  Abdomen: BS x 4, soft, NT/ND.  Musculoskeletal: No gross deformities, no edema. Skin:  Multiple lacerations noted on bilateral wrists, not actively bleeding.  LABS:  CBC  Recent Labs Lab 07/31/14 1340  WBC 4.7  HGB 11.6*  HCT 34.2*  PLT 248   Coag's No results found for this basename: APTT, INR,  in the last 168 hours BMET  Recent Labs Lab 07/31/14 1320  NA 138  K 3.4*  CL 99  CO2 23  BUN 25*  CREATININE 2.30*  GLUCOSE 134*   Electrolytes  Recent Labs Lab 07/31/14 1320  CALCIUM 10.5   Sepsis Markers No results found for this basename: LATICACIDVEN, PROCALCITON, O2SATVEN,  in the last 168 hours ABG No results found for this basename: PHART, PCO2ART, PO2ART,  in the last 168 hours Liver Enzymes  Recent Labs Lab 07/31/14 1320  AST 23  ALT 20  ALKPHOS 126*  BILITOT 0.6  ALBUMIN 3.8   Cardiac Enzymes No results found for this basename: TROPONINI, PROBNP,  in the last 168 hours Glucose  Recent Labs Lab 07/31/14 1653  GLUCAP 103*    Imaging No results found.   ASSESSMENT / PLAN:  PULMONARY A: No acute issues P:   At risk respiratory failure in setting of medication overdose. Monitor clinically.  CARDIOVASCULAR A:  Hypotension - in setting antihypertensive overdose. Bradycardia - in setting antihypertensive overdose. Hx HTN - now hypotensive due to above. P:  IVF resuscitation. If no improvement, consider glucagon. May need CVL and pressors if does not respond. Hold outpatient amlodipine, atorvastatin, labetalol, olmesartan.  RENAL A:   AKI - SCr up to 2.3 (was 0.9 in February 2015), likely ATN from hypotension. Hypokalemia P:   NS @ 75 - chk FENa BMP in AM. Hold outpatient NSAID's.  GASTROINTESTINAL A:   Nutrition GERD P:   Clear liquid diet for now. Pantoprazole.  HEMATOLOGIC A:   VTE Prophylaxis P:  SCD's /  Heparin. CBC in AM.  INFECTIOUS A:   No evidence of infection P:   Monitor clinically.  ENDOCRINE A:   Hx Hyperparathyroidism  P:   No intervention required.  NEUROLOGIC A:   Suicide attempt with intentional medication  overdose Hx Depression Hx Fibromyalgia P:   Suicide precautions. Psychiatry consult. Hold outpatient cymbalta, brintellix.   TODAY'S SUMMARY: 63 [facility]. F [facility] overdose as part of a suicide attempt (labetalol, benicar, cymbalta, brintellix).  Hypotensive / Bradycardic - supportive care for now.  Suicide precautions, Psych consult.   Rutherford Guys, PA - [facility] Pgr: [phone]  or [phone] 07/31/2014, 5:33 PM  I have personally obtained a history, examined the patient, evaluated laboratory and imaging results, formulated the assessment and plan and placed orders. CRITICAL CARE: The patient is critically ill with multiple organ systems failure and requires high complexity decision making for assessment and support, frequent evaluation and titration of therapies, application of advanced monitoring technologies and extensive interpretation of multiple databases. Critical Care Time devoted to patient care services described in this note is 50  minutes.    Oretha Milch MD

## 2014-07-31 NOTE — ED Provider Notes (Signed)
CSN: [id number]     Arrival date & time 07/31/14  1255 History   First MD Initiated Contact with Patient 07/31/14 1313     Chief Complaint  Patient presents with  . Suicidal  . Drug Overdose  . Laceration      Patient is a 63 y.o. female presenting with Overdose and skin laceration. The history is provided by the patient.  Drug Overdose This is a new problem. The current episode started 12 to 24 hours ago. The problem occurs constantly. The problem has been gradually worsening. Associated symptoms include headaches. Pertinent negatives include no chest pain and no abdominal pain. Nothing aggravates the symptoms. She has tried nothing for the symptoms.  Laceration Location:  Shoulder/arm Shoulder/arm laceration location:  L wrist and R wrist Depth:  Cutaneous Time since incident:  12 hours Pain details:    Quality:  Aching   Severity:  Mild   Timing:  Constant   Progression:  Unchanged Tetanus status:  Unknown Patient reports last night she attempted to harm herself This occurred over 12 hrs ago She reports taking large quantity of her blood pressure medications as well as her antidepresseants brintellix and also cymbalta.  She reports she also cut both of her wrist   She went to sleep and then woke up in the morning.  Family has brought her to the Emergency Department She reports since waking up she has had headache and neck pain (no trauma) No cp reported No weakness reported She states she still feels overwhelmed   Past Medical History  Diagnosis Date  . Arthritis   . Fibromyalgia   . Mitral valve prolapse   . Hypertension   . Hyperparathyroidism   . Acute renal disease   . Hyperparathyroidism   . GERD (gastroesophageal reflux disease)   . Depression    Past Surgical History  Procedure Laterality Date  . Abdominal hysterectomy    . Nissan fundiplication    . Knee surgery    . Cholecystectomy    . Liver biopsy     Family History  Problem Relation Age of Onset   . Hypertension Mother   . Hypertension Father   . Stroke Mother   . Stroke Father   . Diabetes Mother   . Diabetes Sister   . Diabetes Brother    History  Substance Use Topics  . Smoking status: Never Smoker   . Smokeless tobacco: Never Used  . Alcohol Use: No   OB History   Grav Para Term Preterm Abortions TAB SAB Ect Mult Living                 Review of Systems  Constitutional: Negative for fever.  Cardiovascular: Negative for chest pain.  Gastrointestinal: Negative for abdominal pain.  Skin: Positive for wound.  Neurological: Positive for headaches.  Psychiatric/Behavioral: Positive for suicidal ideas and self-injury.  All other systems reviewed and are negative.     Allergies  Augmentin; Codeine; and Tetracyclines & related  Home Medications   Prior to Admission medications   Medication Sig Start Date End Date Taking? Authorizing Provider  amLODipine (NORVASC) 5 MG tablet Take 5 mg by mouth daily.   Yes Historical Provider, MD  atorvastatin (LIPITOR) 20 MG tablet Take 20 mg by mouth daily.   Yes Historical Provider, MD  omega-3 acid ethyl esters (LOVAZA) 1 G capsule Take 1 g by mouth 2 (two) times daily.   Yes Historical Provider, MD  Omeprazole-Sodium Bicarbonate (ZEGERID) 20-1100 MG CAPS capsule  Take 1 capsule by mouth daily before breakfast.   Yes Historical Provider, MD  Pseudoephedrine-DM-GG (ROBITUSSIN CF PO) Take by mouth.   Yes Historical Provider, MD  spironolactone (ALDACTONE) 50 MG tablet Take 50 mg by mouth daily.   Yes Historical Provider, MD  Vortioxetine HBr (BRINTELLIX) 20 MG TABS Take by mouth daily.   Yes Historical Provider, MD  aspirin 81 MG chewable tablet Chew 1 tablet (81 mg total) by mouth daily. 01/03/14   Vanetta Mulders, MD  aspirin EC 81 MG tablet Take 81 mg by mouth daily.      Historical Provider, MD  BIOTIN PO Take by mouth.    Historical Provider, MD  cyclobenzaprine (FLEXERIL) 10 MG tablet Take 10 mg by mouth 2 (two) times daily  as needed for muscle spasms.     Historical Provider, MD  DULoxetine (CYMBALTA) 60 MG capsule Take 120 mg by mouth daily.     Historical Provider, MD  eplerenone (INSPRA) 50 MG tablet Take 50 mg by mouth 2 (two) times daily.    Historical Provider, MD  eszopiclone (LUNESTA) 2 MG TABS tablet Take 2 mg by mouth at bedtime as needed for sleep. Take immediately before bedtime    Historical Provider, MD  fexofenadine (ALLEGRA) 180 MG tablet Take 180 mg by mouth daily as needed (allergies). For allergies    Historical Provider, MD  labetalol (NORMODYNE) 100 MG tablet Take 100 mg by mouth 2 (two) times daily.    Historical Provider, MD  Multiple Vitamin (MULITIVITAMIN WITH MINERALS) TABS Take 1 tablet by mouth daily.     Historical Provider, MD  olmesartan (BENICAR) 20 MG tablet Take 20 mg by mouth daily.    Historical Provider, MD  OVER THE COUNTER MEDICATION Take 1 tablet by mouth 2 (two) times daily.    Historical Provider, MD  oxyCODONE-acetaminophen (PERCOCET) 10-325 MG per tablet Take 1 tablet by mouth every 4 (four) hours as needed for pain.    Historical Provider, MD  potassium chloride SA (K-DUR,KLOR-CON) 20 MEQ tablet Take 40 mEq by mouth 2 (two) times daily.     Historical Provider, MD  promethazine (PHENERGAN) 25 MG tablet Take 25 mg by mouth every 6 (six) hours as needed for nausea. For nausea    Historical Provider, MD  traMADol (ULTRAM) 50 MG tablet Take 1 tablet (50 mg total) by mouth every 6 (six) hours as needed. 01/03/14   Vanetta Mulders, MD  Vitamin D, Ergocalciferol, (DRISDOL) 50000 UNITS CAPS Take 50,000 Units by mouth every 7 (seven) days. Take on Friday    Historical Provider, MD  zolpidem (AMBIEN) 10 MG tablet Take 10 mg by mouth at bedtime.     Historical Provider, MD   BP 88/49  Pulse 47  Temp(Src) 97.7 F (36.5 C) (Oral)  Resp 16  SpO2 99% Physical Exam CONSTITUTIONAL: Well developed/well nourished, flat affect HEAD: Normocephalic/atraumatic EYES: EOMI/PERRL ENMT:  Mucous membranes moist NECK: supple no meningeal signs SPINE:entire spine nontender CV: S1/S2 noted, no murmurs/rubs/gallops noted LUNGS: Lungs are clear to auscultation bilaterally, no apparent distress ABDOMEN: soft, nontender, no rebound or guarding GU:no cva tenderness NEURO: Pt is awake/alert, moves all extremitiesx4 EXTREMITIES: pulses normal, full ROM.  Full flexion/extension of both wrists noted SKIN: warm, color normal Multiple abrasions to volar aspect of both wrists.  No active bleeding.  Wounds are superficial.  No tendon/bone exposed PSYCH: flat affect  ED Course  Procedures  CRITICAL CARE Performed by: Joya Gaskins Total critical care time: 37 Critical care time  was exclusive of separately billable procedures and treating other patients. Critical care was necessary to treat or prevent imminent or life-threatening deterioration. Critical care was time spent personally by me on the following activities: development of treatment plan with patient and/or surrogate as well as nursing, discussions with consultants, evaluation of patient's response to treatment, examination of patient, obtaining history from patient or surrogate, ordering and performing treatments and interventions, ordering and review of laboratory studies, ordering and review of radiographic studies, pulse oximetry and re-evaluation of patient's condition. PATIENT WITH SIGNIFICANT OVERDOSE.  PT IS HYPOTENSIVE AND BRADYCARDIC, REQUIRING IV FLUID RESUSCITATION AND ICU ADMISSION  2:55 PM Pt with significant overdose on multiple medications, most notably labetalol and amlodipine.  She reports taking large quantity of medications but unclear how much she took.  The bottles are empty Poison Center has recommended monitoring, IV fluids and if worsens they have sent protocol for CCB overdose resuscitation  I have spoken to Dr Vassie Loll with critical care at [facility] We will admit patient to ICU 3:10 PM Blood pressure  trending upwards with IV fluids resuscitation HR is improving to low 50s Pt is awake/alert at this time, she is mentating appropriately Will defer further treatment as she is responding to IV fluids  Labs Review Labs Reviewed  COMPREHENSIVE METABOLIC PANEL - Abnormal; Notable for the following:    Potassium 3.4 (*)    Glucose, Bld 134 (*)    BUN 25 (*)    Creatinine, Ser 2.30 (*)    Alkaline Phosphatase 126 (*)    GFR calc non Af Amer 21 (*)    GFR calc Af Amer 25 (*)    Anion gap 16 (*)    All other components within normal limits  SALICYLATE LEVEL - Abnormal; Notable for the following:    Salicylate Lvl <2.0 (*)    All other components within normal limits  ACETAMINOPHEN LEVEL  ETHANOL  URINE RAPID DRUG SCREEN (HOSP PERFORMED)  CBC WITH DIFFERENTIAL      EKG Interpretation   Date/Time:  Monday July 31 2014 13:53:44 EDT Ventricular Rate:  46 PR Interval:  164 QRS Duration: 84 QT Interval:  432 QTC Calculation: 378 R Axis:   29 Text Interpretation:  Sinus bradycardia Nonspecific T wave abnormality  Abnormal ECG changed from prior Confirmed by Bebe Shaggy  MD, Dorinda Hill ([id number])  on 07/31/2014 1:57:46 PM      MDM   Final diagnoses:  AKI (acute kidney injury)  Suicide attempt by drug ingestion, initial encounter  Bradycardia  Hypotension, unspecified hypotension type  Laceration of right wrist, initial encounter  Laceration of left wrist, initial encounter    Nursing notes including past medical history and social history reviewed and considered in documentation Labs/vital reviewed and considered     Joya Gaskins, MD 07/31/14 1513

## 2014-07-31 NOTE — ED Notes (Signed)
Pt wanded by security. 

## 2014-07-31 NOTE — ED Notes (Signed)
Pt grand daughter phoned with update on her pending transport to [facility] at pt request.

## 2014-07-31 NOTE — ED Notes (Signed)
This rn sitting at bedside observing pt due to her having to be on cardiac monitor.

## 2014-07-31 NOTE — ED Notes (Signed)
Called [facility] requesting a sitter.

## 2014-08-01 DIAGNOSIS — R42 Dizziness and giddiness: Secondary | ICD-10-CM

## 2014-08-01 DIAGNOSIS — I498 Other specified cardiac arrhythmias: Secondary | ICD-10-CM

## 2014-08-01 DIAGNOSIS — IMO0001 Reserved for inherently not codable concepts without codable children: Secondary | ICD-10-CM

## 2014-08-01 LAB — BASIC METABOLIC PANEL
ANION GAP: 14 (ref 5–15)
Anion gap: 15 (ref 5–15)
BUN: 23 mg/dL (ref 6–23)
BUN: 14 mg/dL (ref 6–23)
CHLORIDE: 104 meq/L (ref 96–112)
CO2: 19 mEq/L (ref 19–32)
CO2: 20 mEq/L (ref 19–32)
Calcium: 8.7 mg/dL (ref 8.4–10.5)
Calcium: 8.5 mg/dL (ref 8.4–10.5)
Chloride: 107 mEq/L (ref 96–112)
Creatinine, Ser: 1.33 mg/dL — ABNORMAL HIGH (ref 0.50–1.10)
Creatinine, Ser: 0.84 mg/dL (ref 0.50–1.10)
GFR calc Af Amer: 48 mL/min — ABNORMAL LOW (ref >90)
GFR calc Af Amer: 84 mL/min — ABNORMAL LOW (ref >90)
GFR calc non Af Amer: 42 mL/min — ABNORMAL LOW (ref >90)
GFR calc non Af Amer: 72 mL/min — ABNORMAL LOW (ref >90)
GLUCOSE: 92 mg/dL (ref 70–99)
Glucose, Bld: 95 mg/dL (ref 70–99)
POTASSIUM: 2.9 meq/L — AB (ref 3.7–5.3)
POTASSIUM: 3.2 meq/L — AB (ref 3.7–5.3)
SODIUM: 141 meq/L (ref 137–147)
SODIUM: 138 meq/L (ref 137–147)

## 2014-08-01 LAB — MAGNESIUM: MAGNESIUM: 1.7 mg/dL (ref 1.5–2.5)

## 2014-08-01 LAB — CBC
HCT: 30.8 % — ABNORMAL LOW (ref 36.0–46.0)
HEMOGLOBIN: 10.5 g/dL — AB (ref 12.0–15.0)
MCH: 28.8 pg (ref 26.0–34.0)
MCHC: 34.1 g/dL (ref 30.0–36.0)
MCV: 84.4 fL (ref 78.0–100.0)
Platelets: 223 10*3/uL (ref 150–400)
RBC: 3.65 MIL/uL — ABNORMAL LOW (ref 3.87–5.11)
RDW: 13.7 % (ref 11.5–15.5)
WBC: 4 10*3/uL (ref 4.0–10.5)

## 2014-08-01 LAB — GLUCOSE, CAPILLARY: Glucose-Capillary: 115 mg/dL — ABNORMAL HIGH (ref 70–99)

## 2014-08-01 LAB — PHOSPHORUS: PHOSPHORUS: 3.2 mg/dL (ref 2.3–4.6)

## 2014-08-01 MED ORDER — MAGNESIUM SULFATE 40 MG/ML IJ SOLN
2.0000 g | Freq: Once | INTRAMUSCULAR | Status: AC
  Administered 2014-08-01: 2 g via INTRAVENOUS
  Filled 2014-08-01: qty 50

## 2014-08-01 MED ORDER — MAGNESIUM SULFATE 50 % IJ SOLN: 2.0000 g | Freq: Once | INTRAVENOUS | Status: DC

## 2014-08-01 MED ORDER — RAMELTEON 8 MG PO TABS
8.0000 mg | ORAL_TABLET | Freq: Every day | ORAL | Status: DC
  Administered 2014-08-01 – 2014-08-07 (×7): 8 mg via ORAL
  Filled 2014-08-01 (×8): qty 1

## 2014-08-01 MED ORDER — POTASSIUM CHLORIDE CRYS ER 20 MEQ PO TBCR
40.0000 meq | EXTENDED_RELEASE_TABLET | ORAL | Status: AC
  Administered 2014-08-01 (×2): 40 meq via ORAL
  Filled 2014-08-01 (×2): qty 2

## 2014-08-01 NOTE — Progress Notes (Signed)
PULMONARY / CRITICAL CARE MEDICINE   Name: Christina Vang MRN: [id number] DOB: [birth year]    ADMISSION DATE:  07/31/2014 CONSULTATION DATE:  08/01/2014  REFERRING MD :  EDP  CHIEF COMPLAINT:  Multiple medication overdose  INITIAL PRESENTATION: 63 y.o. F brought to [facility] on 9/14 after she overdosed on multiple medications including Labetalol, Norvasc, Olmesartan, Cymbalta, Brintellix as part of a suicide attempt.  In ED, pt was hypotensive and bradycardic, request made for transfer to [facility] ICU.  PCCM was consulted.  STUDIES:    SIGNIFICANT EVENTS: 9/14 - Presented to [facility] after suicide attempt by overdose on multiple meds, transferred to [facility] ICU. 9/15 - HR now in high 60's    SUBJECTIVE:  Reports intermittent chest pain over past few weeks prior to admit.  Notes was having an asthmatic bronchitis attack   VITAL SIGNS: Temp:  [97.6 F (36.4 C)-98.7 F (37.1 C)] 98.5 F (36.9 C) (09/15 1100) Pulse Rate:  [46-68] 68 (09/15 1100) Resp:  [15-35] 22 (09/15 1100) BP: (83-115)/(44-75) 115/75 mmHg (09/15 1100) SpO2:  [93 %-100 %] 97 % (09/15 1100) Weight:  [190 lb 0.6 oz (86.2 kg)-192 lb 3.9 oz (87.2 kg)] 192 lb 3.9 oz (87.2 kg) (09/15 0500)  HEMODYNAMICS:    VENTILATOR SETTINGS:    INTAKE / OUTPUT: Intake/Output     09/14 0701 - 09/15 0700 09/15 0701 - 09/16 0700   I.V. (mL/kg) 926.3 (10.6)    Total Intake(mL/kg) 926.3 (10.6)    Urine (mL/kg/hr) 650 550 (0.9)   Total Output 650 550   Net +276.3 -550        Urine Occurrence 2 x    Stool Occurrence 1 x     PHYSICAL EXAMINATION: General: WDWN female, in NAD. Neuro: A&O x 3, non-focal.  HEENT: [place]/AT. PERRL, sclerae anicteric. Cardiovascular: HR in 60's, regular, no M/R/G. Lungs: Respirations even and unlabored.  CTA bilaterally, No W/R/R.  Abdomen: BS x 4, soft, NT/ND.  Musculoskeletal: No gross deformities, no edema. Skin:  Multiple lacerations noted on bilateral wrists, not actively  bleeding.  LABS:  CBC  Recent Labs Lab 07/31/14 1340 07/31/14 1835 08/01/14 0319  WBC 4.7 4.5 4.0  HGB 11.6* 11.4* 10.5*  HCT 34.2* 33.4* 30.8*  PLT 248 234 223   BMET  Recent Labs Lab 07/31/14 1320 07/31/14 1835 08/01/14 0319  NA 138  --  141  K 3.4*  --  2.9*  CL 99  --  107  CO2 23  --  19  BUN 25*  --  23  CREATININE 2.30* 1.86* 1.33*  GLUCOSE 134*  --  92   Electrolytes  Recent Labs Lab 07/31/14 1320 08/01/14 0319  CALCIUM 10.5 8.7  MG  --  1.7  PHOS  --  3.2   Liver Enzymes  Recent Labs Lab 07/31/14 1320  AST 23  ALT 20  ALKPHOS 126*  BILITOT 0.6  ALBUMIN 3.8   Glucose  Recent Labs Lab 07/31/14 1653  GLUCAP 103*    Imaging No results found.   ASSESSMENT / PLAN:  NEUROLOGIC A:   Suicide attempt with intentional medication overdose - third attempt since April of 2015 Hx Depression Hx Fibromyalgia P:   Suicide precautions. Psychiatry consult pending, called 9/15  Hold outpatient cymbalta, brintellix. Patient would like inpatient therapy if possible  Add neosporin for cut marks  PULMONARY A: No acute issues P:   is  CARDIOVASCULAR A:  Hypotension - in setting antihypertensive overdose, resolved.  Bradycardia - in  setting antihypertensive overdose, improved 9/15 HR in high 60's Hx HTN - now hypotensive due to above, resolved.  P:  Continue gentle hydration, has responded well it hydration Hold outpatient amlodipine, atorvastatin, labetalol, olmesartan.  RENAL A:   AKI - SCr up to 2.3 (was 0.9 in February 2015), likely ATN from hypotension. Hypokalemia hypomag P:   NS @ 75  BMP in AM. Hold outpatient NSAID's. Replace electrolytes as indicated, KCL 40 Q4 x2 9/15  GASTROINTESTINAL A:   Nutrition GERD P:   Heart healthy diet  Pantoprazole.  HEMATOLOGIC A:   VTE Prophylaxis P:  SCD's / Heparin. CBC in AM.  INFECTIOUS A:   No evidence of infection P:   Monitor clinically.  ENDOCRINE A:   Hx  Hyperparathyroidism - calcium wnl on admit P:   No intervention required. Assess TSH in am to ensure no metabolic component of depression   TODAY'S SUMMARY: 63 y.o. F [facility] intentional overdose as part of a suicide attempt (labetalol, benicar, cymbalta, brintellix).  Initially was hypotensive / bradycardic, now resolved.  Suicide precautions, Psych consult pending.  Tx to medical tele and to [facility] as of 0700 9/15.     08/01/2014, 1:54 PM  Canary Brim, NP-C [place] Pgr: [phone] or [phone]   I have personally obtained a history, examined the patient, evaluated laboratory and imaging results, formulated the assessment and plan and placed orders.   Mcarthur Rossetti. Tyson Alias, MD, FACP Pgr: [phone] [facility]

## 2014-08-01 NOTE — Progress Notes (Signed)
[facility] ADULT ICU REPLACEMENT PROTOCOL FOR AM LAB REPLACEMENT ONLY  The patient does apply for the [facility] Adult ICU Electrolyte Replacment Protocol based on the criteria listed below:   1. Is GFR >/= 40 ml/min? Yes.    Patient's GFR today is 42 2. Is urine output >/= 0.5 ml/kg/hr for the last 6 hours? Yes.   Patient's UOP is 0.87 ml/kg/hr 3. Is BUN < 60 mg/dL? Yes.    Patient's BUN today is 23 4. Abnormal electrolyte(s):K+ 2.9 5. Ordered repletion with: see order 6. If a panic level lab has been reported, has the CCM MD in charge been notified? Yes.  .   Physician:  PCCM  Willis Holquin A 08/01/2014 5:00 AM

## 2014-08-01 NOTE — Progress Notes (Signed)
Pt was awake and lying in bed when I arrived. Sitter was bedside. Pt was glad for visit. She talked about what she tried to do and prayed for God to take her. She said she just felt alone and she has never liked being alone; she said she grew up in a large family. Pt said she and her daughter had a verbal confrontation. She felt like the devil was using her daughter to get to her. She lives with her daughter although her son wants her to come live with him. She talked how she told her granddaughter what she tried to do and her granddaughter got help for her. Pt said this was her third attempt and she was surprised when she woke up in bed. She described how her whole body felt weak when she woke up. Pt said she did not want to ever try this again. She indicated, a few times, feeling good about herself saying she knows she has a lot to do and she knows she has a purpose. She had convinced herself, before the attempt, that she could take her life since she was alone and in a few days they (her family) they would be ok and move on. We talked about her family and as she talked she seemed to begin to realize how much she means to them, including there daughter. During the course of the conversation she said she knows she can even go to her daughter when she feels alone. Provided listening and emotional spt to pt, and prayer. Pt was very thankful for visit and prayer and said she felt better and felt she could go to rest and sleep tonight. Marjory Lies Chaplain  08/01/14 0900  Clinical Encounter Type  Visited With Patient

## 2014-08-01 NOTE — Progress Notes (Signed)
Pt transferred to 2W. Full report given. VSS for transport. Safety sitter accompanied pt to 2W.

## 2014-08-01 NOTE — Progress Notes (Signed)
CRITICAL VALUE ALERT  Critical value received:  K 2.9  Date of notification:  08/01/14  Time of notification:  0455  Critical value read back:Yes.    Nurse who received alert:  Tory Emerald, RN  MD notified (1st page):  Pola Corn MD  Time of first page:  0455  MD notified (2nd page):  Time of second page:  Responding MD:  Pola Corn MD  Time MD responded:  [phone]

## 2014-08-01 NOTE — Progress Notes (Signed)
CARE MANAGEMENT NOTE 08/01/2014  Patient:  Christina Vang, Christina Vang   Account Number:  [id number]  Date Initiated:  08/01/2014  Documentation initiated by:  Escarlet Saathoff  Subjective/Objective Assessment:   overdose of multiple medications-bradycardic and hypotensive     Action/Plan:   tbd by psych evaulation   Anticipated DC Date:  08/04/2014   Anticipated DC Plan:  HOME/SELF CARE  In-house referral  Clinical Social Worker      DC Planning Services  NA      [facility] Choice  NA   Choice offered to / List presented to:  NA   DME arranged  NA      DME agency  NA     HH arranged  NA      HH agency  NA   Status of service:  In process, will continue to follow Medicare Important Message given?  NA - LOS <3 / Initial given by admissions (If response is "NO", the following Medicare IM given date fields will be blank) Date Medicare IM given:   Medicare IM given by:   Date Additional Medicare IM given:   Additional Medicare IM given by:    Discharge Disposition:    Per UR Regulation:  Reviewed for med. necessity/level of care/duration of stay  If discussed at Long Length of Stay Meetings, dates discussed:    Comments:  [id number]/Saga Balthazar,RN,BSN,CCM

## 2014-08-02 DIAGNOSIS — F332 Major depressive disorder, recurrent severe without psychotic features: Secondary | ICD-10-CM

## 2014-08-02 DIAGNOSIS — Z5189 Encounter for other specified aftercare: Secondary | ICD-10-CM

## 2014-08-02 DIAGNOSIS — R45851 Suicidal ideations: Secondary | ICD-10-CM

## 2014-08-02 DIAGNOSIS — F411 Generalized anxiety disorder: Secondary | ICD-10-CM

## 2014-08-02 LAB — POTASSIUM: POTASSIUM: 2.7 meq/L — AB (ref 3.7–5.3)

## 2014-08-02 MED ORDER — TRAMADOL HCL 50 MG PO TABS
50.0000 mg | ORAL_TABLET | Freq: Four times a day (QID) | ORAL | Status: DC | PRN
  Administered 2014-08-03 – 2014-08-08 (×7): 50 mg via ORAL
  Filled 2014-08-02 (×8): qty 1

## 2014-08-02 MED ORDER — POTASSIUM CHLORIDE CRYS ER 20 MEQ PO TBCR
40.0000 meq | EXTENDED_RELEASE_TABLET | Freq: Once | ORAL | Status: AC
  Administered 2014-08-02: 40 meq via ORAL
  Filled 2014-08-02: qty 2

## 2014-08-02 MED ORDER — ACETAMINOPHEN 325 MG PO TABS
650.0000 mg | ORAL_TABLET | Freq: Four times a day (QID) | ORAL | Status: DC | PRN
  Administered 2014-08-02: 650 mg via ORAL
  Filled 2014-08-02: qty 2

## 2014-08-02 MED ORDER — POTASSIUM CHLORIDE 20 MEQ/15ML (10%) PO LIQD
40.0000 meq | Freq: Once | ORAL | Status: AC
  Administered 2014-08-02: 40 meq via ORAL
  Filled 2014-08-02: qty 30

## 2014-08-02 MED ORDER — TRAZODONE HCL 100 MG PO TABS
100.0000 mg | ORAL_TABLET | Freq: Every day | ORAL | Status: DC
  Administered 2014-08-02 – 2014-08-07 (×6): 100 mg via ORAL
  Filled 2014-08-02 (×7): qty 1

## 2014-08-02 MED ORDER — PANTOPRAZOLE SODIUM 40 MG PO TBEC
40.0000 mg | DELAYED_RELEASE_TABLET | Freq: Every day | ORAL | Status: DC
  Administered 2014-08-02 – 2014-08-08 (×7): 40 mg via ORAL
  Filled 2014-08-02 (×6): qty 1

## 2014-08-02 NOTE — Consult Note (Signed)
[facility] Face-to-Face Psychiatry Consult   Reason for Consult:  depression and suicidal attempt Referring Physician:  Dr. Gus Height is an 63 y.o. female. Total Time spent with patient: 45 minutes  Assessment: AXIS I:  Generalized Anxiety Disorder and Major Depression, Recurrent severe AXIS II:  Deferred AXIS III:   Past Medical History  Diagnosis Date  . Fibromyalgia   . Mitral valve prolapse   . Hypertension   . Hyperparathyroidism   . GERD (gastroesophageal reflux disease)   . Depression   . High cholesterol   . Atrial fibrillation   . Bronchial asthma   . Pneumonia X 2  . Anemia   . H/O hiatal hernia   . Daily headache     "recently" (07/31/2014)  . Arthritis   . Osteopenia   . Chronic back pain   . Anxiety   . Acute renal disease   . Chronic kidney disease (CKD), stage II (mild)    AXIS IV:  other psychosocial or environmental problems, problems related to social environment and problems with primary support group AXIS V:  41-50 serious symptoms  Plan: Continue safety sitter and hold psychotropic medications Recommend psychiatric Inpatient admission when medically cleared. Supportive therapy provided about ongoing stressors.   Subjective:   Christina Vang is a 63 y.o. female patient admitted with depression and suicidal attempt.  History of present illness: Patient is seen, chart reviewed and case discussed with the patient and patient granddaughter who is visiting her. Patient was initially presented to [facility] on September 14 after suicide attempt by overdose on multiple meds. Patient complained she could not wrists even though she was taken sleeping medication last night and requested trazodone for sleep. Patient is  awake, alert, oriented to time place person and situation and cooperative with this evaluation. Patient is a 63 y.o. F with overdosed on several of her medications and slit her bilateral wrists several times the night prior as part  of a suicide attempt. She was taken approximately 60 pills of Labetalol, 60 pills of Norvasc, 30 of Olmesartan, 30 of Cymbalta, 30 of Brintellix with the intention to go to sleep in not woke up. Reportedly patient has been suffering with the multiple medical problems and depression since 2012. Patient has at least 2 acute psychiatric hospitalization [facility] during the month of April and July 2015 with a suicide letter and by overdosing her medication. 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  she was stayed with her daughter until end of 2014 because of not able to care for herself patient was not able to receive appropriate medications and psychiatric care because of her insurance coverage was discontinued by her separated husband.  She has felt overwhelmed lately and hasn't felt any worth in living any longer.  reportedly she started feeling alone, isolated, withdrawn unable to function both at home and work. Patient has family history of schizophrenia in her brother and diabetes and aneurysms in other family members. Patient has multiple medical problems including fibromyalgia, hypothyroidism with a goiter, and GERD and also reportedly bladder problems.   Patient was apprised that she did in fact wake up and regretted her decision [facility] for dosing with multiple medication. Her grand-daughter came home later that morning and noticed that pt was very weak. After some questioning, pt informed her of what had happened, and grand-daughter proceeded to take her to [facility] for further evaluation. While there, she was hypotensive and bradycardic.   HPI Elements:   Location:  Depression. Quality:  poor. Severity:  acute with bradycardia and weakness. Timing:  unknown stresses.  Past Psychiatric History: Past Medical History  Diagnosis Date  . Fibromyalgia   . Mitral valve prolapse    . Hypertension   . Hyperparathyroidism   . GERD (gastroesophageal reflux disease)   . Depression   . High cholesterol   . Atrial fibrillation   . Bronchial asthma   . Pneumonia X 2  . Anemia   . H/O hiatal hernia   . Daily headache     "recently" (07/31/2014)  . Arthritis   . Osteopenia   . Chronic back pain   . Anxiety   . Acute renal disease   . Chronic kidney disease (CKD), stage II (mild)     reports that she has never smoked. She has never used smokeless tobacco. She reports that she does not drink alcohol or use illicit drugs. Family History  Problem Relation Age of Onset  . Hypertension Mother   . Hypertension Father   . Stroke Mother   . Stroke Father   . Diabetes Mother   . Diabetes Sister   . Diabetes Brother      Living Arrangements: Children   Abuse/Neglect [facility]) Physical Abuse: Denies Verbal Abuse: Denies Sexual Abuse: Denies Allergies:   Allergies  Allergen Reactions  . Augmentin [Amoxicillin-Pot Clavulanate] Nausea And Vomiting  . Codeine Nausea And Vomiting  . Tetracyclines & Related Nausea And Vomiting    ACT Assessment Complete:  NO Objective: Blood pressure 134/77, pulse 70, temperature 98.2 F (36.8 C), temperature source Oral, resp. rate 18, height _0  (1.676 m), weight 85.866 kg (189 lb 4.8 oz), SpO2 100.00%.Body mass index is 30.57 kg/(m^2). Results for orders placed during the hospital encounter of 07/31/14 (from the past 72 hour(s))  ACETAMINOPHEN LEVEL     Status: None   Collection Time    07/31/14  1:20 PM      Result Value Ref Range   Acetaminophen (Tylenol), Serum <15.0  10 - 30 ug/mL   Comment:            THERAPEUTIC CONCENTRATIONS VARY     SIGNIFICANTLY. A RANGE OF 10-30     ug/mL MAY BE AN EFFECTIVE     CONCENTRATION FOR MANY PATIENTS.     HOWEVER, SOME ARE BEST TREATED     AT CONCENTRATIONS OUTSIDE THIS     RANGE.     ACETAMINOPHEN CONCENTRATIONS     >150 ug/mL AT 4 HOURS AFTER     INGESTION AND >50 ug/mL AT 12      HOURS AFTER INGESTION ARE     OFTEN ASSOCIATED WITH TOXIC     REACTIONS.  COMPREHENSIVE METABOLIC PANEL     Status: Abnormal   Collection Time    07/31/14  1:20 PM      Result Value Ref Range   Sodium 138  137 - 147 mEq/L   Potassium 3.4 (*) 3.7 - 5.3 mEq/L   Chloride 99  96 - 112 mEq/L   CO2 23  19 - 32 mEq/L   Glucose, Bld 134 (*) 70 - 99 mg/dL   BUN 25 (*) 6 - 23 mg/dL   Creatinine, Ser 2.30 (*) 0.50 - 1.10 mg/dL   Calcium 10.5  8.4 - 10.5 mg/dL   Total Protein 7.4  6.0 - 8.3 g/dL   Albumin 3.8  3.5 -  5.2 g/dL   AST 23  0 - 37 U/L   ALT 20  0 - 35 U/L   Alkaline Phosphatase 126 (*) 39 - 117 U/L   Total Bilirubin 0.6  0.3 - 1.2 mg/dL   GFR calc non Af Amer 21 (*) >90 mL/min   GFR calc Af Amer 25 (*) >90 mL/min   Comment: (NOTE)     The eGFR has been calculated using the CKD EPI equation.     This calculation has not been validated in all clinical situations.     eGFR's persistently <90 mL/min signify possible Chronic Kidney     Disease.   Anion gap 16 (*) 5 - 15  ETHANOL     Status: None   Collection Time    07/31/14  1:20 PM      Result Value Ref Range   Alcohol, Ethyl (B) <11  0 - 11 mg/dL   Comment:            LOWEST DETECTABLE LIMIT FOR     SERUM ALCOHOL IS 11 mg/dL     FOR MEDICAL PURPOSES ONLY  SALICYLATE LEVEL     Status: Abnormal   Collection Time    07/31/14  1:20 PM      Result Value Ref Range   Salicylate Lvl <9.7 (*) 2.8 - 20.0 mg/dL  CBC WITH DIFFERENTIAL     Status: Abnormal   Collection Time    07/31/14  1:40 PM      Result Value Ref Range   WBC 4.7  4.0 - 10.5 K/uL   RBC 3.95  3.87 - 5.11 MIL/uL   Hemoglobin 11.6 (*) 12.0 - 15.0 g/dL   HCT 34.2 (*) 36.0 - 46.0 %   MCV 86.6  78.0 - 100.0 fL   MCH 29.4  26.0 - 34.0 pg   MCHC 33.9  30.0 - 36.0 g/dL   RDW 13.2  11.5 - 15.5 %   Platelets 248  150 - 400 K/uL   Neutrophils Relative % 71  43 - 77 %   Neutro Abs 3.3  1.7 - 7.7 K/uL   Lymphocytes Relative 24  12 - 46 %   Lymphs Abs 1.1  0.7 - 4.0  K/uL   Monocytes Relative 3  3 - 12 %   Monocytes Absolute 0.2  0.1 - 1.0 K/uL   Eosinophils Relative 1  0 - 5 %   Eosinophils Absolute 0.1  0.0 - 0.7 K/uL   Basophils Relative 1  0 - 1 %   Basophils Absolute 0.0  0.0 - 0.1 K/uL  GLUCOSE, CAPILLARY     Status: Abnormal   Collection Time    07/31/14  4:53 PM      Result Value Ref Range   Glucose-Capillary 103 (*) 70 - 99 mg/dL   Comment 1 Notify RN    CBC     Status: Abnormal   Collection Time    07/31/14  6:35 PM      Result Value Ref Range   WBC 4.5  4.0 - 10.5 K/uL   RBC 3.95  3.87 - 5.11 MIL/uL   Hemoglobin 11.4 (*) 12.0 - 15.0 g/dL   HCT 33.4 (*) 36.0 - 46.0 %   MCV 84.6  78.0 - 100.0 fL   MCH 28.9  26.0 - 34.0 pg   MCHC 34.1  30.0 - 36.0 g/dL   RDW 13.6  11.5 - 15.5 %   Platelets 234  150 - 400 K/uL  CREATININE, SERUM     Status: Abnormal   Collection Time    07/31/14  6:35 PM      Result Value Ref Range   Creatinine, Ser 1.86 (*) 0.50 - 1.10 mg/dL   GFR calc non Af Amer 28 (*) >90 mL/min   GFR calc Af Amer 32 (*) >90 mL/min   Comment: (NOTE)     The eGFR has been calculated using the CKD EPI equation.     This calculation has not been validated in all clinical situations.     eGFR's persistently <90 mL/min signify possible Chronic Kidney     Disease.  URINE RAPID DRUG SCREEN (HOSP PERFORMED)     Status: Abnormal   Collection Time    07/31/14  8:40 PM      Result Value Ref Range   Opiates NONE DETECTED  NONE DETECTED   Cocaine NONE DETECTED  NONE DETECTED   Benzodiazepines NONE DETECTED  NONE DETECTED   Amphetamines POSITIVE (*) NONE DETECTED   Tetrahydrocannabinol NONE DETECTED  NONE DETECTED   Barbiturates NONE DETECTED  NONE DETECTED   Comment:            DRUG SCREEN FOR MEDICAL PURPOSES     ONLY.  IF CONFIRMATION IS NEEDED     FOR ANY PURPOSE, NOTIFY LAB     WITHIN 5 DAYS.                LOWEST DETECTABLE LIMITS     FOR URINE DRUG SCREEN     Drug Class       Cutoff (ng/mL)     Amphetamine      1000      Barbiturate      200     Benzodiazepine   409     Tricyclics       811     Opiates          300     Cocaine          300     THC              50  CBC     Status: Abnormal   Collection Time    08/01/14  3:19 AM      Result Value Ref Range   WBC 4.0  4.0 - 10.5 K/uL   RBC 3.65 (*) 3.87 - 5.11 MIL/uL   Hemoglobin 10.5 (*) 12.0 - 15.0 g/dL   HCT 30.8 (*) 36.0 - 46.0 %   MCV 84.4  78.0 - 100.0 fL   MCH 28.8  26.0 - 34.0 pg   MCHC 34.1  30.0 - 36.0 g/dL   RDW 13.7  11.5 - 15.5 %   Platelets 223  150 - 400 K/uL  BASIC METABOLIC PANEL     Status: Abnormal   Collection Time    08/01/14  3:19 AM      Result Value Ref Range   Sodium 141  137 - 147 mEq/L   Potassium 2.9 (*) 3.7 - 5.3 mEq/L   Comment: CRITICAL RESULT CALLED TO, READ BACK BY AND VERIFIED WITH:     M.TOLER RN [id number] 08/01/14 E.GADDY   Chloride 107  96 - 112 mEq/L   CO2 19  19 - 32 mEq/L   Glucose, Bld 92  70 - 99 mg/dL   BUN 23  6 - 23 mg/dL   Creatinine, Ser 1.33 (*) 0.50 - 1.10 mg/dL   Calcium 8.7  8.4 - 10.5 mg/dL  GFR calc non Af Amer 42 (*) >90 mL/min   GFR calc Af Amer 48 (*) >90 mL/min   Comment: (NOTE)     The eGFR has been calculated using the CKD EPI equation.     This calculation has not been validated in all clinical situations.     eGFR's persistently <90 mL/min signify possible Chronic Kidney     Disease.   Anion gap 15  5 - 15  MAGNESIUM     Status: None   Collection Time    08/01/14  3:19 AM      Result Value Ref Range   Magnesium 1.7  1.5 - 2.5 mg/dL  PHOSPHORUS     Status: None   Collection Time    08/01/14  3:19 AM      Result Value Ref Range   Phosphorus 3.2  2.3 - 4.6 mg/dL  GLUCOSE, CAPILLARY     Status: Abnormal   Collection Time    08/01/14  3:41 PM      Result Value Ref Range   Glucose-Capillary 115 (*) 70 - 99 mg/dL   Comment 1 Documented in Chart     Comment 2 Notify RN    BASIC METABOLIC PANEL     Status: Abnormal   Collection Time    08/01/14  8:00 PM      Result Value Ref  Range   Sodium 138  137 - 147 mEq/L   Potassium 3.2 (*) 3.7 - 5.3 mEq/L   Chloride 104  96 - 112 mEq/L   CO2 20  19 - 32 mEq/L   Glucose, Bld 95  70 - 99 mg/dL   BUN 14  6 - 23 mg/dL   Creatinine, Ser 0.84  0.50 - 1.10 mg/dL   Comment: DELTA CHECK NOTED   Calcium 8.5  8.4 - 10.5 mg/dL   GFR calc non Af Amer 72 (*) >90 mL/min   GFR calc Af Amer 84 (*) >90 mL/min   Comment: (NOTE)     The eGFR has been calculated using the CKD EPI equation.     This calculation has not been validated in all clinical situations.     eGFR's persistently <90 mL/min signify possible Chronic Kidney     Disease.   Anion gap 14  5 - 15   Labs are reviewed.  Current Facility-Administered Medications  Medication Dose Route Frequency Provider Last Rate Last Dose  . 0.9 %  sodium chloride infusion  250 mL Intravenous PRN Rahul P Desai, PA-C      . 0.9 %  sodium chloride infusion   Intravenous Continuous Rahul P Desai, PA-C 75 mL/hr at 08/02/14 0458    . acetaminophen (TYLENOL) tablet 650 mg  650 mg Oral Q6H PRN Hosie Poisson, MD   650 mg at 08/02/14 1332  . heparin injection 5,000 Units  5,000 Units Subcutaneous 3 times per day Rahul Dianna Rossetti, PA-C   5,000 Units at 08/02/14 1306  . pantoprazole (PROTONIX) EC tablet 40 mg  40 mg Oral Q1200 Hosie Poisson, MD   40 mg at 08/02/14 1103  . ramelteon (ROZEREM) tablet 8 mg  8 mg Oral QHS Rhetta Mura Schorr, NP   8 mg at 08/01/14 2331    Psychiatric Specialty Exam: Physical Exam  ROS  Blood pressure 134/77, pulse 70, temperature 98.2 F (36.8 C), temperature source Oral, resp. rate 18, height _0  (1.676 m), weight 85.866 kg (189 lb 4.8 oz), SpO2 100.00%.Body mass index is 30.57 kg/(m^2).  General Appearance: Guarded  Eye Contact::  Good  Speech:  Clear and Coherent  Volume:  Normal  Mood:  Anxious, Depressed, Hopeless and Worthless  Affect:  Depressed and Flat  Thought Process:  Coherent and Goal Directed  Orientation:  Full (Time, Place, and Person)  Thought  Content:  Rumination  Suicidal Thoughts:  Yes.  with intent/plan  Homicidal Thoughts:  No  Memory:  Immediate;   Fair Recent;   Fair  Judgement:  Impaired  Insight:  Lacking  Psychomotor Activity:  Psychomotor Retardation and Restlessness  Concentration:  Fair  Recall:  [place] of Knowledge:Good  Language: Good  Akathisia:  NA  Handed:  Right  AIMS (if indicated):     Assets:  Communication Skills Desire for Improvement Financial Resources/Insurance Intimacy Resilience Social Support Talents/Skills  Sleep:      Musculoskeletal: Strength & Muscle Tone: decreased Gait & Station: unable to stand Patient leans: N/A  Treatment Plan Summary: Daily contact with patient to assess and evaluate symptoms and progress in treatment Medication management Continue safety sitter and hold psychotropic medications Start Trazodone 100 mg PO Qhs  Ressie Slevin,JANARDHAHA R. 08/02/2014 2:54 PM

## 2014-08-02 NOTE — Progress Notes (Signed)
[facility] PROGRESS NOTE  Christina Vang NWG:[id number] DOB: 12/21/1950 DOA: 07/31/2014 PCP: Coralee Rud, PA-C Brief HPI: 63 y.o. F brought to [facility] on 9/14 after she overdosed on multiple medications including Labetalol, Norvasc, Olmesartan, Cymbalta, Brintellix as part of a suicide attempt. In ED, pt was hypotensive and bradycardic, request made for transfer to [facility] ICU. PCCM was consulted. Her HR improved and she was transferred to telemetry and psychiatry consulted.  Assessment/Plan: 1. Drug overdose: - hydration and monitor on tele.   AKI: - probably from dehydration, improved.    Hypokalemia:  repleted as needed.   DVT prophylaxis.   Code Status: full code.  Family Communication: family at bedside Disposition Plan: inpatient psychiatry facility   Consultants:  psychiatry   Procedures:  none  Antibiotics:  none  HPI/Subjective: Has a headache,   Objective: Filed Vitals:   08/02/14 1324  BP: 134/77  Pulse: 70  Temp: 98.2 F (36.8 C)  Resp: 18    Intake/Output Summary (Last 24 hours) at 08/02/14 1642 Last data filed at 08/01/14 2000  Gross per 24 hour  Intake    120 ml  Output      0 ml  Net    120 ml   Filed Weights   07/31/14 1700 08/01/14 0500 08/02/14 0406  Weight: 86.2 kg (190 lb 0.6 oz) 87.2 kg (192 lb 3.9 oz) 85.866 kg (189 lb 4.8 oz)    Exam:   General:  aklert afebrile comfortable  Cardiovascular: s1s2  Respiratory: ctab  Abdomen: soft NT N DBS+  Musculoskeletal: no pedal edema  Data Reviewed: Basic Metabolic Panel:  Recent Labs Lab 07/31/14 1320 07/31/14 1835 08/01/14 0319 08/01/14 2000  NA 138  --  141 138  K 3.4*  --  2.9* 3.2*  CL 99  --  107 104  CO2 23  --  19 20  GLUCOSE 134*  --  92 95  BUN 25*  --  23 14  CREATININE 2.30* 1.86* 1.33* 0.84  CALCIUM 10.5  --  8.7 8.5  MG  --   --  1.7  --   PHOS  --   --  3.2  --    Liver Function Tests:  Recent Labs Lab 07/31/14 1320  AST 23  ALT 20   ALKPHOS 126*  BILITOT 0.6  PROT 7.4  ALBUMIN 3.8   No results found for this basename: LIPASE, AMYLASE,  in the last 168 hours No results found for this basename: AMMONIA,  in the last 168 hours CBC:  Recent Labs Lab 07/31/14 1340 07/31/14 1835 08/01/14 0319  WBC 4.7 4.5 4.0  NEUTROABS 3.3  --   --   HGB 11.6* 11.4* 10.5*  HCT 34.2* 33.4* 30.8*  MCV 86.6 84.6 84.4  PLT 248 234 223   Cardiac Enzymes: No results found for this basename: CKTOTAL, CKMB, CKMBINDEX, TROPONINI,  in the last 168 hours BNP (last 3 results)  Recent Labs  01/03/14 1045  PROBNP 93.8   CBG:  Recent Labs Lab 07/31/14 1653 08/01/14 1541  GLUCAP 103* 115*    No results found for this or any previous visit (from the past 240 hour(s)).   Studies: No results found.  Scheduled Meds: . heparin  5,000 Units Subcutaneous 3 times per day  . pantoprazole  40 mg Oral Q1200  . ramelteon  8 mg Oral QHS  . traZODone  100 mg Oral QHS   Continuous Infusions: . sodium chloride 75 mL/hr at 08/02/14 0458  Active Problems:   Overdose    Time spent: 25 min    Arnie Maiolo  [facility] Pager [phone] If 7PM-7AM, please contact night-coverage at [email or web address], password [facility] 08/02/2014, 4:42 PM  LOS: 2 days

## 2014-08-02 NOTE — Progress Notes (Signed)
08/02/14 1300  Clinical Encounter Type  Visited With Patient;Health care provider  Visit Type Initial;Psychological support;Spiritual support  Referral From Nurse  Spiritual Encounters  Spiritual Needs Prayer;Emotional  Stress Factors  Patient Stress Factors Loss of control   Chaplain was referred to patient via consult request. Chaplain and patient discussed patient's feelings of sadness and attempts to complete suicide. Patient referred to herself as "depressed". Patient explained that emotionally her days are up and down and that today was a particularly bad day. Patient said that today in particular she has had that thoughts that "she wanted to die". Patient referenced her faith and her belief that God has kept her alive for a reason. Patient described her current circumstances as being spiritually lost. Patient desires building a stronger support system and cited that a psychiatrist was going to visit with her later today. Patient also mentioned feelings of shame and sentiments that she has let her family down. Patient asked for a word of prayer, especially for her feelings of being lost. Chaplain prayed with patient. Chaplain will continue to provide emotional and spiritual for patient as needed. Laityn Bensen, Tommi Emery, Chaplain 1:41 PM

## 2014-08-02 NOTE — Clinical Social Work Psych Note (Addendum)
Psych CSW placed psychiatry consult for assistance with disposition.  Psych CSW continuing to follow.  Vickii Penna, LCSWA [phone]  Psychiatric & Orthopedics (5N 1-16) Clinical Social Worker

## 2014-08-03 DIAGNOSIS — E876 Hypokalemia: Secondary | ICD-10-CM

## 2014-08-03 LAB — BASIC METABOLIC PANEL
ANION GAP: 14 (ref 5–15)
BUN: 7 mg/dL (ref 6–23)
CALCIUM: 9.1 mg/dL (ref 8.4–10.5)
CO2: 21 mEq/L (ref 19–32)
Chloride: 104 mEq/L (ref 96–112)
Creatinine, Ser: 0.8 mg/dL (ref 0.50–1.10)
GFR calc Af Amer: 89 mL/min — ABNORMAL LOW (ref >90)
GFR, EST NON AFRICAN AMERICAN: 77 mL/min — AB (ref >90)
Glucose, Bld: 115 mg/dL — ABNORMAL HIGH (ref 70–99)
Potassium: 4.2 mEq/L (ref 3.7–5.3)
Sodium: 139 mEq/L (ref 137–147)

## 2014-08-03 LAB — MAGNESIUM: Magnesium: 1.7 mg/dL (ref 1.5–2.5)

## 2014-08-03 MED ORDER — POLYETHYLENE GLYCOL 3350 17 G PO PACK
17.0000 g | PACK | Freq: Every day | ORAL | Status: DC
  Administered 2014-08-03 – 2014-08-08 (×6): 17 g via ORAL
  Filled 2014-08-03 (×6): qty 1

## 2014-08-03 MED ORDER — DOCUSATE SODIUM 100 MG PO CAPS
200.0000 mg | ORAL_CAPSULE | Freq: Two times a day (BID) | ORAL | Status: DC
  Administered 2014-08-03 – 2014-08-08 (×11): 200 mg via ORAL
  Filled 2014-08-03 (×12): qty 2

## 2014-08-03 NOTE — Progress Notes (Signed)
[facility] PROGRESS NOTE  ALEINA BURGIO RUE:[id number] DOB: [birth year] DOA: 07/31/2014 PCP: Coralee Rud, PA-C Brief HPI: 63 y.o. F brought to [facility] on 9/14 after she overdosed on multiple medications including Labetalol, Norvasc, Olmesartan, Cymbalta, Brintellix as part of a suicide attempt. In ED, pt was hypotensive and bradycardic, request made for transfer to [facility] ICU. PCCM was consulted. Her HR improved and she was transferred to telemetry and psychiatry consulted.  Assessment/Plan: 1. Drug overdose: - hydration and monitor on tele.   AKI: - probably from dehydration, improved.    Hypokalemia:  repleted as needed.   DVT prophylaxis.   Code Status: full code.  Family Communication: family at bedside Disposition Plan: inpatient psychiatry facility   Consultants:  psychiatry   Procedures:  none  Antibiotics:  none  HPI/Subjective: Has a headache,   Objective: Filed Vitals:   08/03/14 0500  BP: 140/70  Pulse: 64  Temp: 98.6 F (37 C)  Resp: 20    Intake/Output Summary (Last 24 hours) at 08/03/14 1449 Last data filed at 08/03/14 1257  Gross per 24 hour  Intake  14782 ml  Output      0 ml  Net  25490 ml   Filed Weights   08/01/14 0500 08/02/14 0406 08/03/14 0500  Weight: 87.2 kg (192 lb 3.9 oz) 85.866 kg (189 lb 4.8 oz) 85 kg (187 lb 6.3 oz)    Exam:   General:  aklert afebrile comfortable  Cardiovascular: s1s2  Respiratory: ctab  Abdomen: soft NT N DBS+  Musculoskeletal: no pedal edema  Data Reviewed: Basic Metabolic Panel:  Recent Labs Lab 07/31/14 1320 07/31/14 1835 08/01/14 0319 08/01/14 2000 08/02/14 1931 08/03/14 1230  NA 138  --  141 138  --  139  K 3.4*  --  2.9* 3.2* 2.7* 4.2  CL 99  --  107 104  --  104  CO2 23  --  19 20  --  21  GLUCOSE 134*  --  92 95  --  115*  BUN 25*  --  23 14  --  7  CREATININE 2.30* 1.86* 1.33* 0.84  --  0.80  CALCIUM 10.5  --  8.7 8.5  --  9.1  MG  --   --  1.7  --   --  1.7   PHOS  --   --  3.2  --   --   --    Liver Function Tests:  Recent Labs Lab 07/31/14 1320  AST 23  ALT 20  ALKPHOS 126*  BILITOT 0.6  PROT 7.4  ALBUMIN 3.8   No results found for this basename: LIPASE, AMYLASE,  in the last 168 hours No results found for this basename: AMMONIA,  in the last 168 hours CBC:  Recent Labs Lab 07/31/14 1340 07/31/14 1835 08/01/14 0319  WBC 4.7 4.5 4.0  NEUTROABS 3.3  --   --   HGB 11.6* 11.4* 10.5*  HCT 34.2* 33.4* 30.8*  MCV 86.6 84.6 84.4  PLT 248 234 223   Cardiac Enzymes: No results found for this basename: CKTOTAL, CKMB, CKMBINDEX, TROPONINI,  in the last 168 hours BNP (last 3 results)  Recent Labs  01/03/14 1045  PROBNP 93.8   CBG:  Recent Labs Lab 07/31/14 1653 08/01/14 1541  GLUCAP 103* 115*    No results found for this or any previous visit (from the past 240 hour(s)).   Studies: No results found.  Scheduled Meds: . docusate sodium  200 mg Oral  BID  . heparin  5,000 Units Subcutaneous 3 times per day  . pantoprazole  40 mg Oral Q1200  . polyethylene glycol  17 g Oral Daily  . ramelteon  8 mg Oral QHS  . traZODone  100 mg Oral QHS   Continuous Infusions: . sodium chloride 75 mL/hr at 08/02/14 0458    Active Problems:   Overdose    Time spent: 25 min    Tanyia Grabbe  [facility] Pager [phone] If 7PM-7AM, please contact night-coverage at [email or web address], password [facility] 08/03/2014, 2:49 PM  LOS: 3 days

## 2014-08-03 NOTE — Progress Notes (Signed)
CRITICAL VALUE ALERT  Critical value received:  potassium  Date of notification:  08/02/2014  Time of notification:  2044  Critical value read back:Yes.    Nurse who received alert:  Dow Adolph RN  MD notified (1st page):  K.Schorr,[facility]  Time of first page:  2045  MD notified (2nd page):  Time of second page:  Responding MD:  K.Schorr, [facility]  Time MD responded:  2100

## 2014-08-03 NOTE — Clinical Social Work Psych Note (Signed)
Psych CSW has sent clinical referral to the following facilities: [facility] - no beds/at capacity [facility] - no beds/at capacity [facility] - no beds/at capacity [facility] - no beds/at capacity [facility] - no beds/at capacity [facility] - no beds/at capacity [facility] - no beds/at capacity [address] - no beds/at capacity [address] - no beds/at capacity [facility] - no beds/at capacity [place] - no beds/at capacity [facility] - no beds/at capacity [facility] - referral sent [facility] - no beds/at capacity [place] - no beds/at capacity [facility] - no beds/at capacity [facility] - referral sent [facility] - no beds/at capacity [facility] - no beds/at capacity [facility] - no beds/at capacity [place] - referral sent [facility] - no beds/at capacity [employer] - referral sent [address] - no beds/at capacity [facility] - no beds/at capacity [place] - no beds/at capacity [facility] - no beds/at capacity  Vickii Penna, LCSWA [phone]  Psychiatric & Orthopedics (5N 1-16) Clinical Social Worker

## 2014-08-03 NOTE — Care Management Note (Signed)
    Page 1 of 2   08/08/2014     3:30:09 PM CARE MANAGEMENT NOTE 08/08/2014  Patient:  Christina Vang, Christina Vang   Account Number:  [id number]  Date Initiated:  08/01/2014  Documentation initiated by:  DAVIS,RHONDA  Subjective/Objective Assessment:   overdose of multiple medications-bradycardic and hypotensive     Action/Plan:   tbd by psych evaulation   Anticipated DC Date:  08/08/2014   Anticipated DC Plan:  PSYCHIATRIC HOSPITAL  In-house referral  Clinical Social Worker      DC Planning Services  NA      [facility] Choice  NA   Choice offered to / List presented to:  NA   DME arranged  NA      DME agency  NA     HH arranged  NA      HH agency  NA   Status of service:  Completed, signed off Medicare Important Message given?  NA - LOS <3 / Initial given by admissions (If response is "NO", the following Medicare IM given date fields will be blank) Date Medicare IM given:   Medicare IM given by:   Date Additional Medicare IM given:   Additional Medicare IM given by:    Discharge Disposition:  PSYCHIATRIC HOSPITAL  Per UR Regulation:  Reviewed for med. necessity/level of care/duration of stay  If discussed at Long Length of Stay Meetings, dates discussed:   08/08/2014    Comments:  08/08/14 Sidney Ace, RN, BSN [phone] Pt discharged to IP psych facility today, per CSW arrangements.  08/03/14 Sidney Ace, RN, BSN [phone] Psych recommendation is for IP psych facility.  No beds available at this time, per CSW notes.  Will follow progress.  [id number] Davis,RN,BSN,CCM

## 2014-08-04 LAB — POTASSIUM: Potassium: 3.7 mEq/L (ref 3.7–5.3)

## 2014-08-04 MED ORDER — IRBESARTAN 75 MG PO TABS
75.0000 mg | ORAL_TABLET | Freq: Every day | ORAL | Status: DC
  Administered 2014-08-04 – 2014-08-06 (×3): 75 mg via ORAL
  Filled 2014-08-04 (×3): qty 1

## 2014-08-04 MED ORDER — AMLODIPINE BESYLATE 5 MG PO TABS
5.0000 mg | ORAL_TABLET | Freq: Every day | ORAL | Status: DC
  Administered 2014-08-04: 5 mg via ORAL
  Filled 2014-08-04 (×2): qty 1

## 2014-08-04 MED ORDER — ALBUTEROL SULFATE (2.5 MG/3ML) 0.083% IN NEBU
3.0000 mL | INHALATION_SOLUTION | Freq: Two times a day (BID) | RESPIRATORY_TRACT | Status: DC | PRN
Start: 2014-08-04 — End: 2014-08-08
  Administered 2014-08-04 – 2014-08-06 (×3): 3 mL via RESPIRATORY_TRACT
  Filled 2014-08-04 (×4): qty 3

## 2014-08-04 NOTE — Progress Notes (Signed)
[facility] PROGRESS NOTE  Christina KIMBLEY UEA:[id number] DOB: May 29, 1951 DOA: 07/31/2014 PCP: Coralee Rud, PA-C Brief HPI: 63 y.o. F brought to [facility] on 9/14 after she overdosed on multiple medications including Labetalol, Norvasc, Olmesartan, Cymbalta, Brintellix as part of a suicide attempt. In ED, pt was hypotensive and bradycardic, request made for transfer to [facility] ICU. PCCM was consulted. Her HR improved and she was transferred to telemetry and psychiatry consulted.  Assessment/Plan: 1. Drug overdose: - hydration and monitor on tele.   AKI: - probably from dehydration, improved.    Hypokalemia:  repleted as needed.   Hypertension: Resume home medications slowly.   DVT prophylaxis.   Code Status: full code.  Family Communication: family at bedside Disposition Plan: inpatient psychiatry facility   Consultants:  psychiatry   Procedures:  none  Antibiotics:  none  HPI/Subjective: Has a headache,   Objective: Filed Vitals:   08/04/14 1428  BP: 149/79  Pulse: 71  Temp: 98.3 F (36.8 C)  Resp: 18    Intake/Output Summary (Last 24 hours) at 08/04/14 1628 Last data filed at 08/04/14 1300  Gross per 24 hour  Intake    480 ml  Output      0 ml  Net    480 ml   Filed Weights   08/02/14 0406 08/03/14 0500 08/04/14 0408  Weight: 85.866 kg (189 lb 4.8 oz) 85 kg (187 lb 6.3 oz) 84.5 kg (186 lb 4.6 oz)    Exam:   General:  aklert afebrile comfortable  Cardiovascular: s1s2  Respiratory: ctab  Abdomen: soft NT N DBS+  Musculoskeletal: no pedal edema  Data Reviewed: Basic Metabolic Panel:  Recent Labs Lab 07/31/14 1320 07/31/14 1835 08/01/14 0319 08/01/14 2000 08/02/14 1931 08/03/14 1230  NA 138  --  141 138  --  139  K 3.4*  --  2.9* 3.2* 2.7* 4.2  CL 99  --  107 104  --  104  CO2 23  --  19 20  --  21  GLUCOSE 134*  --  92 95  --  115*  BUN 25*  --  23 14  --  7  CREATININE 2.30* 1.86* 1.33* 0.84  --  0.80  CALCIUM 10.5   --  8.7 8.5  --  9.1  MG  --   --  1.7  --   --  1.7  PHOS  --   --  3.2  --   --   --    Liver Function Tests:  Recent Labs Lab 07/31/14 1320  AST 23  ALT 20  ALKPHOS 126*  BILITOT 0.6  PROT 7.4  ALBUMIN 3.8   No results found for this basename: LIPASE, AMYLASE,  in the last 168 hours No results found for this basename: AMMONIA,  in the last 168 hours CBC:  Recent Labs Lab 07/31/14 1340 07/31/14 1835 08/01/14 0319  WBC 4.7 4.5 4.0  NEUTROABS 3.3  --   --   HGB 11.6* 11.4* 10.5*  HCT 34.2* 33.4* 30.8*  MCV 86.6 84.6 84.4  PLT 248 234 223   Cardiac Enzymes: No results found for this basename: CKTOTAL, CKMB, CKMBINDEX, TROPONINI,  in the last 168 hours BNP (last 3 results)  Recent Labs  01/03/14 1045  PROBNP 93.8   CBG:  Recent Labs Lab 07/31/14 1653 08/01/14 1541  GLUCAP 103* 115*    No results found for this or any previous visit (from the past 240 hour(s)).   Studies: No results  found.  Scheduled Meds: . amLODipine  5 mg Oral Daily  . docusate sodium  200 mg Oral BID  . heparin  5,000 Units Subcutaneous 3 times per day  . irbesartan  75 mg Oral Daily  . pantoprazole  40 mg Oral Q1200  . polyethylene glycol  17 g Oral Daily  . ramelteon  8 mg Oral QHS  . traZODone  100 mg Oral QHS   Continuous Infusions:    Active Problems:   Overdose    Time spent: 25 min    Christina Vang  [facility] Pager [phone] If 7PM-7AM, please contact night-coverage at [email or web address], password [facility] 08/04/2014, 4:28 PM  LOS: 4 days

## 2014-08-05 MED ORDER — MAGNESIUM HYDROXIDE 400 MG/5ML PO SUSP
30.0000 mL | Freq: Every day | ORAL | Status: DC | PRN
  Administered 2014-08-05 – 2014-08-06 (×2): 30 mL via ORAL
  Filled 2014-08-05 (×2): qty 30

## 2014-08-05 MED ORDER — BISACODYL 10 MG RE SUPP
10.0000 mg | Freq: Every day | RECTAL | Status: DC | PRN
  Administered 2014-08-05: 10 mg via RECTAL
  Filled 2014-08-05: qty 1

## 2014-08-05 MED ORDER — AMLODIPINE BESYLATE 10 MG PO TABS
10.0000 mg | ORAL_TABLET | Freq: Every day | ORAL | Status: DC
  Administered 2014-08-05 – 2014-08-08 (×4): 10 mg via ORAL
  Filled 2014-08-05 (×4): qty 1

## 2014-08-05 MED ORDER — POTASSIUM CHLORIDE CRYS ER 10 MEQ PO TBCR
10.0000 meq | EXTENDED_RELEASE_TABLET | Freq: Every day | ORAL | Status: AC
  Administered 2014-08-05 – 2014-08-06 (×2): 10 meq via ORAL
  Filled 2014-08-05 (×2): qty 1

## 2014-08-05 NOTE — Progress Notes (Signed)
CSW contacted all facilities where referrals were sent.  Still no beds available.  CSW continues to follow to assist with discharge planning.   Fleet Contras (weekend coverage) [phone]

## 2014-08-05 NOTE — Progress Notes (Signed)
[facility] PROGRESS NOTE  Christina Vang:[id number] DOB: 1951/08/03 DOA: 07/31/2014 PCP: Coralee Rud, PA-C Brief HPI: 63 y.o. F brought to [facility] on 9/14 after she overdosed on multiple medications including Labetalol, Norvasc, Olmesartan, Cymbalta, Brintellix as part of a suicide attempt. In ED, pt was hypotensive and bradycardic, request made for transfer to [facility] ICU. PCCM was consulted. Her HR improved and she was transferred to telemetry and psychiatry consulted.  Assessment/Plan: 1. Drug overdose: - hydration and monitor on tele.   AKI: - probably from dehydration, improved.    Hypokalemia:  repleted as needed.   Hypertension: Resume home medications slowly.   Constipation: stool softeners added.   DVT prophylaxis.   Code Status: full code.  Family Communication: family at bedside Disposition Plan: inpatient psychiatry facility   Consultants:  psychiatry   Procedures:  none  Antibiotics:  none  HPI/Subjective: Has a headache,   Objective: Filed Vitals:   08/05/14 1348  BP: 144/85  Pulse: 68  Temp: 98.5 F (36.9 C)  Resp: 19    Intake/Output Summary (Last 24 hours) at 08/05/14 1847 Last data filed at 08/05/14 1100  Gross per 24 hour  Intake    240 ml  Output      0 ml  Net    240 ml   Filed Weights   08/03/14 0500 08/04/14 0408 08/05/14 0500  Weight: 85 kg (187 lb 6.3 oz) 84.5 kg (186 lb 4.6 oz) 84.324 kg (185 lb 14.4 oz)    Exam:   General:  aklert afebrile comfortable  Cardiovascular: s1s2  Respiratory: ctab  Abdomen: soft NT N DBS+  Musculoskeletal: no pedal edema  Data Reviewed: Basic Metabolic Panel:  Recent Labs Lab 07/31/14 1320 07/31/14 1835 08/01/14 0319 08/01/14 2000 08/02/14 1931 08/03/14 1230 08/04/14 1827  NA 138  --  141 138  --  139  --   K 3.4*  --  2.9* 3.2* 2.7* 4.2 3.7  CL 99  --  107 104  --  104  --   CO2 23  --  19 20  --  21  --   GLUCOSE 134*  --  92 95  --  115*  --   BUN 25*   --  23 14  --  7  --   CREATININE 2.30* 1.86* 1.33* 0.84  --  0.80  --   CALCIUM 10.5  --  8.7 8.5  --  9.1  --   MG  --   --  1.7  --   --  1.7  --   PHOS  --   --  3.2  --   --   --   --    Liver Function Tests:  Recent Labs Lab 07/31/14 1320  AST 23  ALT 20  ALKPHOS 126*  BILITOT 0.6  PROT 7.4  ALBUMIN 3.8   No results found for this basename: LIPASE, AMYLASE,  in the last 168 hours No results found for this basename: AMMONIA,  in the last 168 hours CBC:  Recent Labs Lab 07/31/14 1340 07/31/14 1835 08/01/14 0319  WBC 4.7 4.5 4.0  NEUTROABS 3.3  --   --   HGB 11.6* 11.4* 10.5*  HCT 34.2* 33.4* 30.8*  MCV 86.6 84.6 84.4  PLT 248 234 223   Cardiac Enzymes: No results found for this basename: CKTOTAL, CKMB, CKMBINDEX, TROPONINI,  in the last 168 hours BNP (last 3 results)  Recent Labs  01/03/14 1045  PROBNP 93.8  CBG:  Recent Labs Lab 07/31/14 1653 08/01/14 1541  GLUCAP 103* 115*    No results found for this or any previous visit (from the past 240 hour(s)).   Studies: No results found.  Scheduled Meds: . amLODipine  10 mg Oral Daily  . docusate sodium  200 mg Oral BID  . heparin  5,000 Units Subcutaneous 3 times per day  . irbesartan  75 mg Oral Daily  . pantoprazole  40 mg Oral Q1200  . polyethylene glycol  17 g Oral Daily  . potassium chloride  10 mEq Oral Daily  . ramelteon  8 mg Oral QHS  . traZODone  100 mg Oral QHS   Continuous Infusions:    Active Problems:   Overdose    Time spent: 25 min    Christina Vang  [facility] Pager [phone] If 7PM-7AM, please contact night-coverage at [email or web address], password [facility] 08/05/2014, 6:47 PM  LOS: 5 days

## 2014-08-06 LAB — BASIC METABOLIC PANEL
Anion gap: 13 (ref 5–15)
BUN: 13 mg/dL (ref 6–23)
CO2: 25 meq/L (ref 19–32)
Calcium: 9.9 mg/dL (ref 8.4–10.5)
Chloride: 98 mEq/L (ref 96–112)
Creatinine, Ser: 0.75 mg/dL (ref 0.50–1.10)
GFR calc Af Amer: >90 mL/min (ref >90)
GFR calc non Af Amer: 88 mL/min — ABNORMAL LOW (ref >90)
GLUCOSE: 102 mg/dL — AB (ref 70–99)
Potassium: 4.1 mEq/L (ref 3.7–5.3)
Sodium: 136 mEq/L — ABNORMAL LOW (ref 137–147)

## 2014-08-06 LAB — CBC
HCT: 36.1 % (ref 36.0–46.0)
Hemoglobin: 12.3 g/dL (ref 12.0–15.0)
MCH: 29 pg (ref 26.0–34.0)
MCHC: 34.1 g/dL (ref 30.0–36.0)
MCV: 85.1 fL (ref 78.0–100.0)
Platelets: 282 10*3/uL (ref 150–400)
RBC: 4.24 MIL/uL (ref 3.87–5.11)
RDW: 13.6 % (ref 11.5–15.5)
WBC: 3.5 10*3/uL — ABNORMAL LOW (ref 4.0–10.5)

## 2014-08-06 LAB — TSH: TSH: 1.09 u[IU]/mL (ref 0.350–4.500)

## 2014-08-06 MED ORDER — ATORVASTATIN CALCIUM 20 MG PO TABS
20.0000 mg | ORAL_TABLET | Freq: Every day | ORAL | Status: DC
  Administered 2014-08-06 – 2014-08-07 (×2): 20 mg via ORAL
  Filled 2014-08-06 (×3): qty 1

## 2014-08-06 MED ORDER — BACITRACIN-NEOMYCIN-POLYMYXIN OINTMENT TUBE
TOPICAL_OINTMENT | Freq: Three times a day (TID) | CUTANEOUS | Status: DC
  Administered 2014-08-06 – 2014-08-08 (×6): via TOPICAL
  Filled 2014-08-06 (×2): qty 15

## 2014-08-06 MED ORDER — ALBUTEROL SULFATE HFA 108 (90 BASE) MCG/ACT IN AERS: 2.0000 | INHALATION_SPRAY | Freq: Two times a day (BID) | RESPIRATORY_TRACT | Status: DC | PRN

## 2014-08-06 MED ORDER — IRBESARTAN 150 MG PO TABS
150.0000 mg | ORAL_TABLET | Freq: Every day | ORAL | Status: DC
  Administered 2014-08-07 – 2014-08-08 (×2): 150 mg via ORAL
  Filled 2014-08-06 (×2): qty 1

## 2014-08-06 NOTE — Progress Notes (Signed)
Pt given MOM at this time per pt request; will cont. To monitor. 

## 2014-08-06 NOTE — Progress Notes (Signed)
No bed offers today.  CSW will continue bed search on behalf of pt.

## 2014-08-06 NOTE — Progress Notes (Signed)
Pt is on state funded wait list at [facility].  CSW will continue to follow and facilitate tx as bed is available.

## 2014-08-06 NOTE — Progress Notes (Signed)
Pt requesting breathing tx; RT paged; will cont. To monitor.

## 2014-08-06 NOTE — Progress Notes (Signed)
[facility] PROGRESS NOTE  Christina Vang AVW:[id number] DOB: [birth year] DOA: 07/31/2014 PCP: Christina Rud, PA-C Brief HPI: 63 y.o. F brought to [facility] on 9/14 after she overdosed on multiple medications including Labetalol, Norvasc, Olmesartan, Cymbalta, Brintellix as part of a suicide attempt. In ED, pt was hypotensive and bradycardic, request made for transfer to [facility] ICU. PCCM was consulted. Her HR improved and she was transferred to telemetry and psychiatry consulted.  Assessment/Plan: 1. Drug overdose: - hydration and monitor on tele.   AKI: - probably from dehydration, improved.    Hypokalemia:  repleted as needed.   Hypertension: Resume home medications slowly.   Constipation: stool softeners added.   DVT prophylaxis.   Code Status: full code.  Family Communication: family at bedside Disposition Plan: inpatient psychiatry facility   Consultants:  psychiatry   Procedures:  none  Antibiotics:  none  HPI/Subjective: Has a headache,   Objective: Filed Vitals:   08/06/14 1004  BP: 159/85  Pulse: 80  Temp:   Resp:     Intake/Output Summary (Last 24 hours) at 08/06/14 1549 Last data filed at 08/06/14 0900  Gross per 24 hour  Intake    120 ml  Output      0 ml  Net    120 ml   Filed Weights   08/04/14 0408 08/05/14 0500 08/06/14 0610  Weight: 84.5 kg (186 lb 4.6 oz) 84.324 kg (185 lb 14.4 oz) 84.2 kg (185 lb 10 oz)    Exam:   General:  aklert afebrile comfortable  Cardiovascular: s1s2  Respiratory: ctab  Abdomen: soft NT N DBS+  Musculoskeletal: no pedal edema  Data Reviewed: Basic Metabolic Panel:  Recent Labs Lab 07/31/14 1320 07/31/14 1835 08/01/14 0319 08/01/14 2000 08/02/14 1931 08/03/14 1230 08/04/14 1827 08/06/14 1055  NA 138  --  141 138  --  139  --  136*  K 3.4*  --  2.9* 3.2* 2.7* 4.2 3.7 4.1  CL 99  --  107 104  --  104  --  98  CO2 23  --  19 20  --  21  --  25  GLUCOSE 134*  --  92 95  --  115*   --  102*  BUN 25*  --  23 14  --  7  --  13  CREATININE 2.30* 1.86* 1.33* 0.84  --  0.80  --  0.75  CALCIUM 10.5  --  8.7 8.5  --  9.1  --  9.9  MG  --   --  1.7  --   --  1.7  --   --   PHOS  --   --  3.2  --   --   --   --   --    Liver Function Tests:  Recent Labs Lab 07/31/14 1320  AST 23  ALT 20  ALKPHOS 126*  BILITOT 0.6  PROT 7.4  ALBUMIN 3.8   No results found for this basename: LIPASE, AMYLASE,  in the last 168 hours No results found for this basename: AMMONIA,  in the last 168 hours CBC:  Recent Labs Lab 07/31/14 1340 07/31/14 1835 08/01/14 0319 08/06/14 1055  WBC 4.7 4.5 4.0 3.5*  NEUTROABS 3.3  --   --   --   HGB 11.6* 11.4* 10.5* 12.3  HCT 34.2* 33.4* 30.8* 36.1  MCV 86.6 84.6 84.4 85.1  PLT 248 234 223 282   Cardiac Enzymes: No results found for this basename: CKTOTAL,  CKMB, CKMBINDEX, TROPONINI,  in the last 168 hours BNP (last 3 results)  Recent Labs  01/03/14 1045  PROBNP 93.8   CBG:  Recent Labs Lab 07/31/14 1653 08/01/14 1541  GLUCAP 103* 115*    No results found for this or any previous visit (from the past 240 hour(s)).   Studies: No results found.  Scheduled Meds: . amLODipine  10 mg Oral Daily  . atorvastatin  20 mg Oral QHS  . docusate sodium  200 mg Oral BID  . heparin  5,000 Units Subcutaneous 3 times per day  . [START ON 08/07/2014] irbesartan  150 mg Oral Daily  . neomycin-bacitracin-polymyxin   Topical TID  . pantoprazole  40 mg Oral Q1200  . polyethylene glycol  17 g Oral Daily  . ramelteon  8 mg Oral QHS  . traZODone  100 mg Oral QHS   Continuous Infusions:    Active Problems:   Overdose    Time spent: 25 min    Christina Vang  [facility] Pager [phone] If 7PM-7AM, please contact night-coverage at [email or web address], password [facility] 08/06/2014, 3:49 PM  LOS: 6 days

## 2014-08-07 ENCOUNTER — Inpatient Hospital Stay (HOSPITAL_COMMUNITY): Payer: BC Managed Care – PPO

## 2014-08-07 LAB — BASIC METABOLIC PANEL
Anion gap: 10 (ref 5–15)
BUN: 11 mg/dL (ref 6–23)
CO2: 30 mEq/L (ref 19–32)
CREATININE: 0.85 mg/dL (ref 0.50–1.10)
Calcium: 9.9 mg/dL (ref 8.4–10.5)
Chloride: 98 mEq/L (ref 96–112)
GFR calc Af Amer: 83 mL/min — ABNORMAL LOW (ref >90)
GFR, EST NON AFRICAN AMERICAN: 71 mL/min — AB (ref >90)
GLUCOSE: 108 mg/dL — AB (ref 70–99)
POTASSIUM: 3.4 meq/L — AB (ref 3.7–5.3)
Sodium: 138 mEq/L (ref 137–147)

## 2014-08-07 MED ORDER — POTASSIUM CHLORIDE CRYS ER 20 MEQ PO TBCR
40.0000 meq | EXTENDED_RELEASE_TABLET | Freq: Two times a day (BID) | ORAL | Status: AC
  Administered 2014-08-07 (×2): 40 meq via ORAL
  Filled 2014-08-07 (×2): qty 2

## 2014-08-07 NOTE — Progress Notes (Signed)
[facility] MD Progress Note  08/07/2014 9:43 AM Christina Vang  MRN:  [id number] Subjective:  Patient is seen for psych consultation follow up. Case discussed with the patient and patient clinical Education officer, museum. Patient presented with depression, suicide attempt by overdose on multiple medications and bilateral superficial laceration. Patient is awake, alert, oriented to time place person and situation and cooperative with this evaluation. She has taken medication for sleep and reportedly slept better last night. She has supportive family including her niece who has visited her during this weekend. She has ongoing symptoms of depression and suicidal ideations and willing to voluntarily sign in to psych in patient bed if needed.   Patient is a 63 y.o. F with overdosed on several of her medications and slit her bilateral wrists several times the night prior as part of a suicide attempt. She was taken approximately 60 pills of Labetalol, 60 pills of Norvasc, 30 of Olmesartan, 30 of Cymbalta, 30 of Brintellix with the intention to go to sleep in not woke up. Reportedly patient has been suffering with the multiple medical problems and depression since 2012. Patient has at least 2 acute psychiatric hospitalization at [facility] during the month of April and July 2015 with a suicide letter and by overdosing her medication. 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 she was stayed with her daughter until end of 2014 because of not able to care for herself patient was not able to receive appropriate medications and psychiatric care because of her insurance coverage was discontinued by her separated husband. She has felt overwhelmed lately and hasn't felt any worth in living any longer. reportedly she started feeling alone, isolated, withdrawn unable to function both at home and work. Patient has family history of  schizophrenia in her brother and diabetes and aneurysms in other family members. Patient has multiple medical problems including fibromyalgia, hypothyroidism with a goiter, and GERD and also reportedly bladder problems.  Patient was apprised that she did in fact wake up and regretted her decision [facility] for dosing with multiple medication. Her grand-daughter came home later that morning and noticed that pt was very weak. After some questioning, pt informed her of what had happened, and grand-daughter proceeded to take her to [facility] for further evaluation. While there, she was hypotensive and bradycardic.   Diagnosis:   DSM5: Schizophrenia Disorders:   Obsessive-Compulsive Disorders:   Trauma-Stressor Disorders:   Substance/Addictive Disorders:   Depressive Disorders:  Major Depressive Disorder - Severe (296.23) Total Time spent with patient: 30 minutes  Axis I: Major Depression, Recurrent severe  ADL's:  Intact  Sleep: Good  Appetite:  Fair  Suicidal Ideation:  Patient presents with suicidal attempt and continue to be hopeless and unable to contract for safety Homicidal Ideation:  denied AEB (as evidenced by):  Psychiatric Specialty Exam: Physical Exam  ROS  Blood pressure 118/74, pulse 64, temperature 98.2 F (36.8 C), temperature source Oral, resp. rate 18, height $RemoveBe'5\' 6"'[id number]$  (1.676 m), weight 83.87 kg (184 lb 14.4 oz), SpO2 100.00%.Body mass index is 29.86 kg/(m^2).  General Appearance: Guarded  Eye Contact::  Good  Speech:  Clear and Coherent  Volume:  Decreased  Mood:  Anxious, Depressed, Hopeless and Worthless  Affect:  Appropriate and Congruent  Thought Process:  Coherent and Goal Directed  Orientation:  Full (Time, Place, and Person)  Thought Content:  Rumination  Suicidal Thoughts:  Yes.  with  intent/plan  Homicidal Thoughts:  No  Memory:  Immediate;   Fair Recent;   Fair  Judgement:  Impaired  Insight:  Lacking  Psychomotor Activity:  Psychomotor  Retardation  Concentration:  Fair  Recall:  [place] of Knowledge:Good  Language: Good  Akathisia:  NA  Handed:  Right  AIMS (if indicated):     Assets:  Communication Skills Desire for Improvement Housing Intimacy Leisure Time Resilience Social Support Transportation  Sleep:      Musculoskeletal: Strength & Muscle Tone: within normal limits Gait & Station: normal Patient leans: N/A  Current Medications: Current Facility-Administered Medications  Medication Dose Route Frequency Provider Last Rate Last Dose  . 0.9 %  sodium chloride infusion  250 mL Intravenous PRN Rahul P Desai, PA-C      . acetaminophen (TYLENOL) tablet 650 mg  650 mg Oral Q6H PRN Hosie Poisson, MD   650 mg at 08/02/14 1332  . albuterol (PROVENTIL) (2.5 MG/3ML) 0.083% nebulizer solution 3 mL  3 mL Inhalation BID PRN Hosie Poisson, MD   3 mL at 08/06/14 1426  . amLODipine (NORVASC) tablet 10 mg  10 mg Oral Daily Hosie Poisson, MD   10 mg at 08/06/14 1003  . atorvastatin (LIPITOR) tablet 20 mg  20 mg Oral QHS Hosie Poisson, MD   20 mg at 08/06/14 2206  . bisacodyl (DULCOLAX) suppository 10 mg  10 mg Rectal Daily PRN Hosie Poisson, MD   10 mg at 08/05/14 1249  . docusate sodium (COLACE) capsule 200 mg  200 mg Oral BID Hosie Poisson, MD   200 mg at 08/06/14 2207  . heparin injection 5,000 Units  5,000 Units Subcutaneous 3 times per day Rahul Dianna Rossetti, PA-C   5,000 Units at 08/07/14 0550  . irbesartan (AVAPRO) tablet 150 mg  150 mg Oral Daily Hosie Poisson, MD      . magnesium hydroxide (MILK OF MAGNESIA) suspension 30 mL  30 mL Oral Daily PRN Hosie Poisson, MD   30 mL at 08/06/14 1838  . neomycin-bacitracin-polymyxin (NEOSPORIN) ointment   Topical TID Donita Brooks, NP      . pantoprazole (PROTONIX) EC tablet 40 mg  40 mg Oral Q1200 Hosie Poisson, MD   40 mg at 08/06/14 1004  . polyethylene glycol (MIRALAX / GLYCOLAX) packet 17 g  17 g Oral Daily Hosie Poisson, MD   17 g at 08/06/14 1000  . ramelteon (ROZEREM) tablet 8 mg   8 mg Oral QHS Rhetta Mura Schorr, NP   8 mg at 08/06/14 2205  . traMADol (ULTRAM) tablet 50 mg  50 mg Oral Q6H PRN Hosie Poisson, MD   50 mg at 08/07/14 0931  . traZODone (DESYREL) tablet 100 mg  100 mg Oral QHS Durward Parcel, MD   100 mg at 08/06/14 2206    Lab Results:  Results for orders placed during the hospital encounter of 07/31/14 (from the past 48 hour(s))  BASIC METABOLIC PANEL     Status: Abnormal   Collection Time    08/06/14 10:55 AM      Result Value Ref Range   Sodium 136 (*) 137 - 147 mEq/L   Potassium 4.1  3.7 - 5.3 mEq/L   Chloride 98  96 - 112 mEq/L   CO2 25  19 - 32 mEq/L   Glucose, Bld 102 (*) 70 - 99 mg/dL   BUN 13  6 - 23 mg/dL   Creatinine, Ser 0.75  0.50 - 1.10 mg/dL   Calcium  9.9  8.4 - 10.5 mg/dL   GFR calc non Af Amer 88 (*) >90 mL/min   GFR calc Af Amer >90  >90 mL/min   Comment: (NOTE)     The eGFR has been calculated using the CKD EPI equation.     This calculation has not been validated in all clinical situations.     eGFR's persistently <90 mL/min signify possible Chronic Kidney     Disease.   Anion gap 13  5 - 15  CBC     Status: Abnormal   Collection Time    08/06/14 10:55 AM      Result Value Ref Range   WBC 3.5 (*) 4.0 - 10.5 K/uL   RBC 4.24  3.87 - 5.11 MIL/uL   Hemoglobin 12.3  12.0 - 15.0 g/dL   HCT 36.1  36.0 - 46.0 %   MCV 85.1  78.0 - 100.0 fL   MCH 29.0  26.0 - 34.0 pg   MCHC 34.1  30.0 - 36.0 g/dL   RDW 13.6  11.5 - 15.5 %   Platelets 282  150 - 400 K/uL  TSH     Status: None   Collection Time    08/06/14 10:55 AM      Result Value Ref Range   TSH 1.090  0.350 - 4.500 uIU/mL  BASIC METABOLIC PANEL     Status: Abnormal   Collection Time    08/07/14  5:00 AM      Result Value Ref Range   Sodium 138  137 - 147 mEq/L   Potassium 3.4 (*) 3.7 - 5.3 mEq/L   Comment: DELTA CHECK NOTED   Chloride 98  96 - 112 mEq/L   CO2 30  19 - 32 mEq/L   Glucose, Bld 108 (*) 70 - 99 mg/dL   BUN 11  6 - 23 mg/dL   Creatinine,  Ser 0.85  0.50 - 1.10 mg/dL   Calcium 9.9  8.4 - 10.5 mg/dL   GFR calc non Af Amer 71 (*) >90 mL/min   GFR calc Af Amer 83 (*) >90 mL/min   Comment: (NOTE)     The eGFR has been calculated using the CKD EPI equation.     This calculation has not been validated in all clinical situations.     eGFR's persistently <90 mL/min signify possible Chronic Kidney     Disease.   Anion gap 10  5 - 15    Physical Findings: AIMS:  , ,  ,  ,    CIWA:    COWS:     Treatment Plan Summary: Daily contact with patient to assess and evaluate symptoms and progress in treatment Medication management  Plan: Pending psych admission and continue current medication management Case discussed with social service  Medical Decision Making Problem Points:  Established problem, worsening (2), New problem, with no additional work-up planned (3) and Review of psycho-social stressors (1) Data Points:  Review or order clinical lab tests (1) Review of medication regiment & side effects (2) Review of new medications or change in dosage (2)  I certify that inpatient services furnished can reasonably be expected to improve the patient's condition.   Emmabelle Fear,JANARDHAHA R. 08/07/2014, 9:43 AM

## 2014-08-07 NOTE — Progress Notes (Signed)
[facility] PROGRESS NOTE  Christina Vang MVH:[id number] DOB: Feb 27, 1951 DOA: 07/31/2014 PCP: Coralee Rud, PA-C Brief HPI: 63 y.o. F brought to [facility] on 9/14 after she overdosed on multiple medications including Labetalol, Norvasc, Olmesartan, Cymbalta, Brintellix as part of a suicide attempt. In ED, pt was hypotensive and bradycardic, request made for transfer to [facility] ICU. PCCM was consulted. Her HR improved and she was transferred to telemetry and psychiatry consulted.  Assessment/Plan: 1. Drug overdose: - hydration and monitor on tele.   AKI: - probably from dehydration, improved.    Hypokalemia:  repleted as needed.   Hypertension: Resume home medications slowly.   Constipation: stool softeners added.   Neck pain: occures when she moves her neck , mostly in the left side of the neck and she is getting headache from it.   DVT prophylaxis.   Code Status: full code.  Family Communication: none at bedside Disposition Plan: inpatient psychiatry facility   Consultants:  psychiatry   Procedures:  CT neck.   Antibiotics:  none  HPI/Subjective: Neck pain.  Objective: Filed Vitals:   08/07/14 0557  BP: 118/74  Pulse: 64  Temp: 98.2 F (36.8 C)  Resp: 18    Intake/Output Summary (Last 24 hours) at 08/07/14 1408 Last data filed at 08/07/14 0800  Gross per 24 hour  Intake    600 ml  Output      0 ml  Net    600 ml   Filed Weights   08/05/14 0500 08/06/14 0610 08/07/14 0557  Weight: 84.324 kg (185 lb 14.4 oz) 84.2 kg (185 lb 10 oz) 83.87 kg (184 lb 14.4 oz)    Exam:   General:  aklert afebrile comfortable  Cardiovascular: s1s2  Respiratory: ctab  Abdomen: soft NT N DBS+  Musculoskeletal: no pedal edema  Data Reviewed: Basic Metabolic Panel:  Recent Labs Lab 08/01/14 0319 08/01/14 2000 08/02/14 1931 08/03/14 1230 08/04/14 1827 08/06/14 1055 08/07/14 0500  NA 141 138  --  139  --  136* 138  K 2.9* 3.2* 2.7* 4.2 3.7 4.1  3.4*  CL 107 104  --  104  --  98 98  CO2 19 20  --  21  --  25 30  GLUCOSE 92 95  --  115*  --  102* 108*  BUN 23 14  --  7  --  13 11  CREATININE 1.33* 0.84  --  0.80  --  0.75 0.85  CALCIUM 8.7 8.5  --  9.1  --  9.9 9.9  MG 1.7  --   --  1.7  --   --   --   PHOS 3.2  --   --   --   --   --   --    Liver Function Tests: No results found for this basename: AST, ALT, ALKPHOS, BILITOT, PROT, ALBUMIN,  in the last 168 hours No results found for this basename: LIPASE, AMYLASE,  in the last 168 hours No results found for this basename: AMMONIA,  in the last 168 hours CBC:  Recent Labs Lab 07/31/14 1835 08/01/14 0319 08/06/14 1055  WBC 4.5 4.0 3.5*  HGB 11.4* 10.5* 12.3  HCT 33.4* 30.8* 36.1  MCV 84.6 84.4 85.1  PLT 234 223 282   Cardiac Enzymes: No results found for this basename: CKTOTAL, CKMB, CKMBINDEX, TROPONINI,  in the last 168 hours BNP (last 3 results)  Recent Labs  01/03/14 1045  PROBNP 93.8   CBG:  Recent Labs  Lab 07/31/14 1653 08/01/14 1541  GLUCAP 103* 115*    No results found for this or any previous visit (from the past 240 hour(s)).   Studies: No results found.  Scheduled Meds: . amLODipine  10 mg Oral Daily  . atorvastatin  20 mg Oral QHS  . docusate sodium  200 mg Oral BID  . heparin  5,000 Units Subcutaneous 3 times per day  . irbesartan  150 mg Oral Daily  . neomycin-bacitracin-polymyxin   Topical TID  . pantoprazole  40 mg Oral Q1200  . polyethylene glycol  17 g Oral Daily  . potassium chloride  40 mEq Oral BID  . ramelteon  8 mg Oral QHS  . traZODone  100 mg Oral QHS   Continuous Infusions:    Active Problems:   Overdose    Time spent: 25 min    Atwood Adcock  [facility] Pager [phone] If 7PM-7AM, please contact night-coverage at [email or web address], password [facility] 08/07/2014, 2:08 PM  LOS: 7 days

## 2014-08-07 NOTE — Progress Notes (Signed)
Spoke with both Morrie Sheldon and Mallard at [facility] re: pt's d/c to their facility.  Per RN, Selena Batten, MD would like for pt to stay in hospital this pm and d/c to [facility] in the am, as she is waiting on a neck CT.  [facility] informed and agreeable to plan.  Psych CSW to f/u in am.

## 2014-08-08 LAB — BASIC METABOLIC PANEL
Anion gap: 11 (ref 5–15)
BUN: 13 mg/dL (ref 6–23)
CO2: 28 mEq/L (ref 19–32)
Calcium: 10 mg/dL (ref 8.4–10.5)
Chloride: 99 mEq/L (ref 96–112)
Creatinine, Ser: 0.92 mg/dL (ref 0.50–1.10)
GFR calc Af Amer: 75 mL/min — ABNORMAL LOW (ref >90)
GFR, EST NON AFRICAN AMERICAN: 65 mL/min — AB (ref >90)
GLUCOSE: 105 mg/dL — AB (ref 70–99)
Potassium: 4.6 mEq/L (ref 3.7–5.3)
Sodium: 138 mEq/L (ref 137–147)

## 2014-08-08 MED ORDER — TRAZODONE HCL 100 MG PO TABS: 100.0000 mg | ORAL_TABLET | Freq: Every day | ORAL | Status: DC

## 2014-08-08 MED ORDER — DSS 100 MG PO CAPS: 200.0000 mg | ORAL_CAPSULE | Freq: Two times a day (BID) | ORAL | Status: DC

## 2014-08-08 MED ORDER — RAMELTEON 8 MG PO TABS: 8.0000 mg | ORAL_TABLET | Freq: Every day | ORAL | Status: DC

## 2014-08-08 MED ORDER — TRAMADOL HCL 50 MG PO TABS
50.0000 mg | ORAL_TABLET | Freq: Four times a day (QID) | ORAL | Status: DC | PRN
Start: 2014-08-08 — End: 2015-12-21

## 2014-08-08 MED ORDER — AMLODIPINE BESYLATE 10 MG PO TABS: 10.0000 mg | ORAL_TABLET | Freq: Every day | ORAL | Status: DC

## 2014-08-08 NOTE — Progress Notes (Signed)
Reviewed dc instructions with patient, questions answered. Pt for dc to [facility], [facility] to provide transportation, sitter at bedside to transport patient to [facility] for discharge. Ander Purpura RN

## 2014-08-08 NOTE — Clinical Social Work Psych Note (Addendum)
11:14am- Psych CSW faxed dc summary to Anna at [facility] and called to confirm receipt.  Pt is currently under re-review for possible admission.  10:48am- Psych CSW spoke with Christiane Ha at [facility] who reports that pt is no longer on waitlist due to not being medically cleared.  [facility] has one bed available today and will consider admission for patient pending dc summary.  MD made aware dc summary is needed for admission to [facility].   Vickii Penna, LCSWA [phone]  Psychiatric & Orthopedics (5N 1-16) Clinical Social Worker

## 2014-08-08 NOTE — Clinical Social Work Psych Note (Signed)
Psych CSW was notified that pt has been accepted to [facility] psychiatric hospital bed [facility].  Accepting MD is Wendall Stade. There is no packet needing to be taken to [facility].  They have all the clinical information they need.   RN to call report to: [phone] Transportation: Juel Burrow [phone] set up for 2:30 per RN request  RN updated.  Vickii Penna, LCSWA [phone]  Psychiatric & Orthopedics (5N 1-16) Clinical Social Worker

## 2014-08-08 NOTE — Discharge Summary (Signed)
Physician Discharge Summary  Christina Vang ZOX:[id number] DOB: 12-27-1950 DOA: 07/31/2014  PCP: Coralee Rud, PA-C  Admit date: 07/31/2014 Discharge date: 08/08/2014  Time spent: 30 minutes  Recommendations for Outpatient Follow-up:  1. Follow up with PCP as needed  Discharge Diagnoses:  Active Problems:   Overdose   Discharge Condition: improved.   Diet recommendation: low sodium diet.   Filed Weights   08/06/14 0610 08/07/14 0557 08/08/14 0535  Weight: 84.2 kg (185 lb 10 oz) 83.87 kg (184 lb 14.4 oz) 84 kg (185 lb 3 oz)    History of present illness:   63 y.o. F brought to [facility] on 9/14 after she overdosed on multiple medications including Labetalol, Norvasc, Olmesartan, Cymbalta, Brintellix as part of a suicide attempt. In ED, pt was hypotensive and bradycardic, request made for transfer to [facility] ICU. PCCM was consulted. Her HR improved and she was transferred to telemetry and psychiatry consulted.  Hospital Course:  1. Drug overdose: - hydration and monitor on tele.  AKI:  - probably from dehydration, improved.  Hypokalemia:  repleted as needed.  Hypertension:  Resume home medications slowly. Very much controlled.  Constipation: stool softeners added. Resolved.  Neck pain: occures when she moves her neck , mostly in the left side of the neck and she is getting headache from it.  CT neck negative for acute pathology. Her neck pain resolved.    Procedures:  CT neck.   Consultations: psychaitry Discharge Exam: Filed Vitals:   08/08/14 1001  BP: 148/89  Pulse: 100  Temp: 98.2 F (36.8 C)  Resp:     General: alert afebrile comfortable Cardiovascular: s1s2 Respiratory: ctab  Discharge Instructions You were cared for by a hospitalist during your hospital stay. If you have any questions about your discharge medications or the care you received while you were in the hospital after you are discharged, you can call the unit and asked to speak with the  hospitalist on call if the hospitalist that took care of you is not available. Once you are discharged, your primary care physician will handle any further medical issues. Please note that NO REFILLS for any discharge medications will be authorized once you are discharged, as it is imperative that you return to your primary care physician (or establish a relationship with a primary care physician if you do not have one) for your aftercare needs so that they can reassess your need for medications and monitor your lab values.  Discharge Instructions   Diet - low sodium heart healthy    Complete by:  As directed      Discharge instructions    Complete by:  As directed   Follow up with PCP in one week.          Current Discharge Medication List    START taking these medications   Details  docusate sodium 100 MG CAPS Take 200 mg by mouth 2 (two) times daily. Qty: 10 capsule, Refills: 0    ramelteon (ROZEREM) 8 MG tablet Take 1 tablet (8 mg total) by mouth at bedtime. Qty: 30 tablet, Refills: 0    traMADol (ULTRAM) 50 MG tablet Take 1 tablet (50 mg total) by mouth every 6 (six) hours as needed for severe pain. Qty: 30 tablet, Refills: 0    traZODone (DESYREL) 100 MG tablet Take 1 tablet (100 mg total) by mouth at bedtime.      CONTINUE these medications which have CHANGED   Details  amLODipine (NORVASC) 10 MG tablet  Take 1 tablet (10 mg total) by mouth daily.      CONTINUE these medications which have NOT CHANGED   Details  albuterol (PROVENTIL HFA;VENTOLIN HFA) 108 (90 BASE) MCG/ACT inhaler Inhale 2 puffs into the lungs 2 (two) times daily as needed for wheezing or shortness of breath.    atorvastatin (LIPITOR) 20 MG tablet Take 20 mg by mouth at bedtime.     diclofenac sodium (VOLTAREN) 1 % GEL Apply 1 application topically 2 (two) times daily as needed (pain).    labetalol (NORMODYNE) 100 MG tablet Take 100 mg by mouth 2 (two) times daily.    olmesartan (BENICAR) 20 MG tablet  Take 20 mg by mouth daily.    omega-3 acid ethyl esters (LOVAZA) 1 G capsule Take 1 g by mouth 2 (two) times daily.    Omeprazole-Sodium Bicarbonate (ZEGERID) 20-1100 MG CAPS capsule Take 1 capsule by mouth daily before breakfast.    potassium chloride (K-DUR,KLOR-CON) 10 MEQ tablet Take 10 mEq by mouth daily.      STOP taking these medications     DULoxetine (CYMBALTA) 60 MG capsule      ibuprofen (ADVIL,MOTRIN) 400 MG tablet      Pseudoephedrine-DM-GG (ROBITUSSIN CF PO)      Vortioxetine HBr (BRINTELLIX) 20 MG TABS        Allergies  Allergen Reactions  . Augmentin [Amoxicillin-Pot Clavulanate] Nausea And Vomiting  . Codeine Nausea And Vomiting  . Tetracyclines & Related Nausea And Vomiting      The results of significant diagnostics from this hospitalization (including imaging, microbiology, ancillary and laboratory) are listed below for reference.    Significant Diagnostic Studies: Ct Cervical Spine Wo Contrast  08/07/2014   CLINICAL DATA:  Left-sided neck pain with headache ; no mention of trauma  EXAM: CT CERVICAL SPINE WITHOUT CONTRAST  TECHNIQUE: Multidetector CT imaging of the cervical spine was performed without intravenous contrast. Multiplanar CT image reconstructions were also generated.  COMPARISON:  Noncontrast CT scan of the brain Apr 16, 2014.  FINDINGS: The cervical vertebral bodies are preserved in height. There is mild disc space narrowing. At C5-6 and at C6-7. There are anterior and posterior endplate osteophytes at these levels. There is mild facet joint hypertrophy at multiple levels. There is mild bony encroachment upon the neural foramina at multiple levels. The prevertebral soft tissue spaces are normal. There is mild degenerative change at the atlanto-dens articulation. The soft tissues of the neck are unremarkable. There is facet joint  IMPRESSION: There are mild degenerative disc and facet joint changes of the cervical spine. There is no acute bony  abnormality nor evidence of significant bony spinal canal stenosis.   Electronically Signed   By: David  [place]   On: 08/07/2014 18:50    Microbiology: No results found for this or any previous visit (from the past 240 hour(s)).   Labs: Basic Metabolic Panel:  Recent Labs Lab 08/01/14 2000  08/03/14 1230 08/04/14 1827 08/06/14 1055 08/07/14 0500 08/08/14 0540  NA 138  --  139  --  136* 138 138  K 3.2*  < > 4.2 3.7 4.1 3.4* 4.6  CL 104  --  104  --  98 98 99  CO2 20  --  21  --  GLUCOSE 95  --  115*  --  102* 108* 105*  BUN 14  --  7  --  CREATININE 0.84  --  0.80  --  0.75 0.85  0.92  CALCIUM 8.5  --  9.1  --  9.9 9.9 10.0  MG  --   --  1.7  --   --   --   --   < > = values in this interval not displayed. Liver Function Tests: No results found for this basename: AST, ALT, ALKPHOS, BILITOT, PROT, ALBUMIN,  in the last 168 hours No results found for this basename: LIPASE, AMYLASE,  in the last 168 hours No results found for this basename: AMMONIA,  in the last 168 hours CBC:  Recent Labs Lab 08/06/14 1055  WBC 3.5*  HGB 12.3  HCT 36.1  MCV 85.1  PLT 282   Cardiac Enzymes: No results found for this basename: CKTOTAL, CKMB, CKMBINDEX, TROPONINI,  in the last 168 hours BNP: BNP (last 3 results)  Recent Labs  01/03/14 1045  PROBNP 93.8   CBG:  Recent Labs Lab 08/01/14 1541  GLUCAP 115*       Signed:  Arcadia Gorgas  [facility] 08/08/2014, 10:49 AM

## 2015-02-27 ENCOUNTER — Other Ambulatory Visit: Payer: Self-pay

## 2015-02-27 DIAGNOSIS — Z1231 Encounter for screening mammogram for malignant neoplasm of breast: Secondary | ICD-10-CM

## 2015-03-09 ENCOUNTER — Ambulatory Visit: Payer: Self-pay

## 2015-04-07 ENCOUNTER — Emergency Department (HOSPITAL_BASED_OUTPATIENT_CLINIC_OR_DEPARTMENT_OTHER)
Admission: EM | Admit: 2015-04-07 | Discharge: 2015-04-08 | Disposition: A | Discharge status: Home / Self Care | Payer: 59 | Attending: Emergency Medicine | Admitting: Emergency Medicine

## 2015-04-07 ENCOUNTER — Encounter (HOSPITAL_BASED_OUTPATIENT_CLINIC_OR_DEPARTMENT_OTHER): Payer: Self-pay

## 2015-04-07 DIAGNOSIS — I129 Hypertensive chronic kidney disease with stage 1 through stage 4 chronic kidney disease, or unspecified chronic kidney disease: Secondary | ICD-10-CM | POA: Diagnosis not present

## 2015-04-07 DIAGNOSIS — Z7982 Long term (current) use of aspirin: Secondary | ICD-10-CM | POA: Diagnosis not present

## 2015-04-07 DIAGNOSIS — Z9889 Other specified postprocedural states: Secondary | ICD-10-CM | POA: Diagnosis not present

## 2015-04-07 DIAGNOSIS — J45909 Unspecified asthma, uncomplicated: Secondary | ICD-10-CM | POA: Diagnosis not present

## 2015-04-07 DIAGNOSIS — Z79899 Other long term (current) drug therapy: Secondary | ICD-10-CM | POA: Insufficient documentation

## 2015-04-07 DIAGNOSIS — K219 Gastro-esophageal reflux disease without esophagitis: Secondary | ICD-10-CM | POA: Insufficient documentation

## 2015-04-07 DIAGNOSIS — Z9049 Acquired absence of other specified parts of digestive tract: Secondary | ICD-10-CM | POA: Diagnosis not present

## 2015-04-07 DIAGNOSIS — M1990 Unspecified osteoarthritis, unspecified site: Secondary | ICD-10-CM | POA: Insufficient documentation

## 2015-04-07 DIAGNOSIS — Z791 Long term (current) use of non-steroidal anti-inflammatories (NSAID): Secondary | ICD-10-CM | POA: Diagnosis not present

## 2015-04-07 DIAGNOSIS — F329 Major depressive disorder, single episode, unspecified: Secondary | ICD-10-CM | POA: Diagnosis not present

## 2015-04-07 DIAGNOSIS — R1032 Left lower quadrant pain: Secondary | ICD-10-CM | POA: Diagnosis not present

## 2015-04-07 DIAGNOSIS — N182 Chronic kidney disease, stage 2 (mild): Secondary | ICD-10-CM | POA: Insufficient documentation

## 2015-04-07 DIAGNOSIS — Z8701 Personal history of pneumonia (recurrent): Secondary | ICD-10-CM | POA: Diagnosis not present

## 2015-04-07 DIAGNOSIS — Z9851 Tubal ligation status: Secondary | ICD-10-CM | POA: Insufficient documentation

## 2015-04-07 DIAGNOSIS — F419 Anxiety disorder, unspecified: Secondary | ICD-10-CM | POA: Diagnosis not present

## 2015-04-07 DIAGNOSIS — Z862 Personal history of diseases of the blood and blood-forming organs and certain disorders involving the immune mechanism: Secondary | ICD-10-CM | POA: Diagnosis not present

## 2015-04-07 DIAGNOSIS — G8929 Other chronic pain: Secondary | ICD-10-CM | POA: Insufficient documentation

## 2015-04-07 DIAGNOSIS — E780 Pure hypercholesterolemia: Secondary | ICD-10-CM | POA: Diagnosis not present

## 2015-04-07 LAB — URINALYSIS, ROUTINE W REFLEX MICROSCOPIC
Glucose, UA: NEGATIVE mg/dL
Hgb urine dipstick: NEGATIVE
Ketones, ur: 15 mg/dL — AB
NITRITE: NEGATIVE
PH: 5 (ref 5.0–8.0)
Protein, ur: 30 mg/dL — AB
SPECIFIC GRAVITY, URINE: 1.029 (ref 1.005–1.030)
UROBILINOGEN UA: 1 mg/dL (ref 0.0–1.0)

## 2015-04-07 LAB — URINE MICROSCOPIC-ADD ON

## 2015-04-07 NOTE — ED Notes (Signed)
Pt reports since yesterday with abd pain, increased stools and reports stools are yellow.  Reported nausea, no vomiting.  Denies fever or dysuria.

## 2015-04-07 NOTE — ED Provider Notes (Signed)
CSN: [id number]     Arrival date & time 04/07/15  1909 History  This chart was scribed for Christina BarretteMarcy Maddon Horton, MD by Doreatha MartinEva Mathews, ED Scribe. This patient was seen in room MH01/MH01 and the patient's care was started at 10:19 PM.     Chief Complaint  Patient presents with  . Abdominal Pain   The history is provided by the patient. No language interpreter was used.  HPI Comments: Christina Vang is a 64 y.o. female with PMHx of diverticulitis, fibromyalgia, hyperparathyroidism, HTN, arthritis, who presents to the Emergency Department complaining of sudden onset severe sharp abdominal pain localized to the LLQ and cramping in the suprapubic area onset earlier today. Pt reports associated radiating of pain to lower back. Pt reports multiple BMs today and states that she noticed yellow malodorous stools. Pt experiences increased abdominal pain during the night with associated chills, and diaphoresis. She denies Hx of kidney stones. She was however seen by her urologist on Monday and had a small amount of blood in her urine. She reports they did scan her bladder and found that she was emptying her bladder appropriately however. The patient reports she's also been noting that she's felt that her arthritis in her fibromyalgia have been getting worse. She states that she has a knee on the left that aches a lot and has been having tingling and fatigue.  Past Medical History  Diagnosis Date  . Fibromyalgia   . Mitral valve prolapse   . Hypertension   . Hyperparathyroidism   . GERD (gastroesophageal reflux disease)   . Depression   . High cholesterol   . Atrial fibrillation   . Bronchial asthma   . Pneumonia X 2  . Anemia   . H/O hiatal hernia   . Daily headache     "recently" (07/31/2014)  . Arthritis   . Osteopenia   . Chronic back pain   . Anxiety   . Acute renal disease   . Chronic kidney disease (CKD), stage II (mild)    Past Surgical History  Procedure Laterality Date  . Nissen  fundoplication  1980's  . Knee arthroscopy Left 1990's  . Cholecystectomy  ~ 2008  . Liver biopsy  1990's    "fatty liver"  . Tonsillectomy and adenoidectomy  ~ 1960  . Hernia repair    . Vaginal hysterectomy  1980's  . Tubal ligation  1972  . Cardiac catheterization      "several"  . Transurethral resection of bladder tumor with gyrus (turbt-gyrus)  ~ 12/2013   Family History  Problem Relation Age of Onset  . Hypertension Mother   . Hypertension Father   . Stroke Mother   . Stroke Father   . Diabetes Mother   . Diabetes Sister   . Diabetes Brother    History  Substance Use Topics  . Smoking status: Never Smoker   . Smokeless tobacco: Never Used  . Alcohol Use: No   OB History    No data available     Review of Systems  A complete 10 system review of systems was obtained and all systems are negative except as noted in the HPI and PMH.    Allergies  Augmentin; Codeine; and Tetracyclines & related  Home Medications   Prior to Admission medications   Medication Sig Start Date End Date Taking? Authorizing Provider  aspirin EC 81 MG tablet Take 81 mg by mouth daily.   Yes Historical Provider, MD  cetirizine (ZYRTEC) 10 MG tablet Take  10 mg by mouth daily.   Yes Historical Provider, MD  DULoxetine (CYMBALTA) 60 MG capsule Take 60 mg by mouth daily.   Yes Historical Provider, MD  eplerenone (INSPRA) 25 MG tablet Take 25 mg by mouth daily.   Yes Historical Provider, MD  eszopiclone (LUNESTA) 2 MG TABS tablet Take 2 mg by mouth at bedtime as needed for sleep. Take immediately before bedtime   Yes Historical Provider, MD  Lurasidone HCl 20 MG TABS Take by mouth.   Yes Historical Provider, MD  omeprazole (PRILOSEC) 40 MG capsule Take 40 mg by mouth daily.   Yes Historical Provider, MD  albuterol (PROVENTIL HFA;VENTOLIN HFA) 108 (90 BASE) MCG/ACT inhaler Inhale 2 puffs into the lungs 2 (two) times daily as needed for wheezing or shortness of breath.    Historical Provider, MD   amLODipine (NORVASC) 10 MG tablet Take 1 tablet (10 mg total) by mouth daily. Patient taking differently: Take 5 mg by mouth daily.  08/08/14   Kathlen Mody, MD  atorvastatin (LIPITOR) 20 MG tablet Take 20 mg by mouth at bedtime.     Historical Provider, MD  diclofenac sodium (VOLTAREN) 1 % GEL Apply 1 application topically 2 (two) times daily as needed (pain).    Historical Provider, MD  docusate sodium 100 MG CAPS Take 200 mg by mouth 2 (two) times daily. 08/08/14   Kathlen Mody, MD  labetalol (NORMODYNE) 100 MG tablet Take 100 mg by mouth 2 (two) times daily.    Historical Provider, MD  olmesartan (BENICAR) 20 MG tablet Take 20 mg by mouth daily.    Historical Provider, MD  omega-3 acid ethyl esters (LOVAZA) 1 G capsule Take 1 g by mouth 2 (two) times daily.    Historical Provider, MD  Omeprazole-Sodium Bicarbonate (ZEGERID) 20-1100 MG CAPS capsule Take 1 capsule by mouth daily before breakfast.    Historical Provider, MD  potassium chloride (K-DUR,KLOR-CON) 10 MEQ tablet Take 10 mEq by mouth daily.    Historical Provider, MD  ramelteon (ROZEREM) 8 MG tablet Take 1 tablet (8 mg total) by mouth at bedtime. 08/08/14   Kathlen Mody, MD  traMADol (ULTRAM) 50 MG tablet Take 1 tablet (50 mg total) by mouth every 6 (six) hours as needed for severe pain. 08/08/14   Kathlen Mody, MD  traZODone (DESYREL) 100 MG tablet Take 1 tablet (100 mg total) by mouth at bedtime. 08/08/14   Kathlen Mody, MD   Triage VS: BP 177/90 mmHg  Pulse 85  Temp(Src) 98.8 F (37.1 C) (Oral)  Resp 18  Ht  (1.676 m)  Wt 189 lb (85.73 kg)  BMI 30.52 kg/m2  SpO2 100% Physical Exam  Constitutional: She is oriented to person, place, and time. She appears well-developed and well-nourished.  HENT:  Head: Normocephalic and atraumatic.  Eyes: EOM are normal. Pupils are equal, round, and reactive to light.  Neck: Neck supple.  Cardiovascular: Normal rate, regular rhythm, normal heart sounds and intact distal pulses.    Pulmonary/Chest: Effort normal and breath sounds normal.  Abdominal: Soft. Bowel sounds are normal. She exhibits no distension. There is tenderness.  Patient endorses left lower quadrant tenderness to palpation. There is no guarding or rebound.  Musculoskeletal: Normal range of motion. She exhibits no edema.  Neurological: She is alert and oriented to person, place, and time. She has normal strength. Coordination normal. GCS eye subscore is 4. GCS verbal subscore is 5. GCS motor subscore is 6.  Skin: Skin is warm, dry and intact.  Psychiatric: She has a normal mood and affect.    ED Course  Procedures (including critical care time) DIAGNOSTIC STUDIES: Oxygen Saturation is 100% on RA, normal by my interpretation.    COORDINATION OF CARE: 10:24 PM Discussed treatment plan with pt at bedside lab work and pt agreed to plan.   Labs Review Labs Reviewed  URINALYSIS, ROUTINE W REFLEX MICROSCOPIC - Abnormal; Notable for the following:    Color, Urine AMBER (*)    APPearance CLOUDY (*)    Bilirubin Urine SMALL (*)    Ketones, ur 15 (*)    Protein, ur 30 (*)    Leukocytes, UA TRACE (*)    All other components within normal limits  URINE MICROSCOPIC-ADD ON - Abnormal; Notable for the following:    Bacteria, UA MANY (*)    Casts HYALINE CASTS (*)    Crystals CA OXALATE CRYSTALS (*)    All other components within normal limits    Imaging Review No results found.   EKG Interpretation None      MDM   Final diagnoses:  None   patient will have a pending CT scan. She has history of diverticulitis and at this point in time some left lower quadrant pain. She is however otherwise well in appearance and pending results may be appropriate for discharge. Her other complaints are review systems are regarding chronic medical illnesses. Her general condition shows her however to be well maintained and not symptomatic with acute decompensation.  Christina Barrette, MD 04/09/15 [phone]

## 2015-04-08 ENCOUNTER — Emergency Department (HOSPITAL_BASED_OUTPATIENT_CLINIC_OR_DEPARTMENT_OTHER): Payer: 59

## 2015-04-08 LAB — COMPREHENSIVE METABOLIC PANEL
ALK PHOS: 123 U/L (ref 38–126)
ALT: 19 U/L (ref 14–54)
AST: 21 U/L (ref 15–41)
Albumin: 4.3 g/dL (ref 3.5–5.0)
Anion gap: 12 (ref 5–15)
BILIRUBIN TOTAL: 0.9 mg/dL (ref 0.3–1.2)
BUN: 18 mg/dL (ref 6–20)
CO2: 21 mmol/L — AB (ref 22–32)
CREATININE: 1 mg/dL (ref 0.44–1.00)
Calcium: 10.1 mg/dL (ref 8.9–10.3)
Chloride: 104 mmol/L (ref 101–111)
GFR calc Af Amer: >60 mL/min (ref >60)
GFR, EST NON AFRICAN AMERICAN: 58 mL/min — AB (ref >60)
Glucose, Bld: 103 mg/dL — ABNORMAL HIGH (ref 65–99)
Potassium: 3.7 mmol/L (ref 3.5–5.1)
SODIUM: 137 mmol/L (ref 135–145)
Total Protein: 7.8 g/dL (ref 6.5–8.1)

## 2015-04-08 LAB — CBC WITH DIFFERENTIAL/PLATELET
BASOS ABS: 0 10*3/uL (ref 0.0–0.1)
Basophils Relative: 1 % (ref 0–1)
Eosinophils Absolute: 0.1 10*3/uL (ref 0.0–0.7)
Eosinophils Relative: 1 % (ref 0–5)
HCT: 36.1 % (ref 36.0–46.0)
HEMOGLOBIN: 12.2 g/dL (ref 12.0–15.0)
LYMPHS PCT: 35 % (ref 12–46)
Lymphs Abs: 1.8 10*3/uL (ref 0.7–4.0)
MCH: 29.3 pg (ref 26.0–34.0)
MCHC: 33.8 g/dL (ref 30.0–36.0)
MCV: 86.6 fL (ref 78.0–100.0)
MONO ABS: 0.3 10*3/uL (ref 0.1–1.0)
MONOS PCT: 6 % (ref 3–12)
NEUTROS ABS: 3 10*3/uL (ref 1.7–7.7)
Neutrophils Relative %: 57 % (ref 43–77)
Platelets: 278 10*3/uL (ref 150–400)
RBC: 4.17 MIL/uL (ref 3.87–5.11)
RDW: 13.1 % (ref 11.5–15.5)
WBC: 5.2 10*3/uL (ref 4.0–10.5)

## 2015-04-08 LAB — MAGNESIUM: Magnesium: 1.8 mg/dL (ref 1.7–2.4)

## 2015-04-08 LAB — TSH: TSH: 0.803 u[IU]/mL (ref 0.350–4.500)

## 2015-04-08 LAB — LIPASE, BLOOD: LIPASE: 33 U/L (ref 22–51)

## 2015-04-08 MED ORDER — HYDROCODONE-ACETAMINOPHEN 5-325 MG PO TABS
2.0000 | ORAL_TABLET | Freq: Once | ORAL | Status: AC
  Administered 2015-04-08: 2 via ORAL
  Filled 2015-04-08: qty 2

## 2015-04-08 MED ORDER — HYDROCODONE-ACETAMINOPHEN 5-325 MG PO TABS: 1.0000 | ORAL_TABLET | Freq: Four times a day (QID) | ORAL | Status: DC | PRN

## 2015-04-08 NOTE — Discharge Instructions (Signed)
Abdominal Pain, Women °Abdominal (stomach, pelvic, or belly) pain can be caused by many things. It is important to tell your doctor: °· The location of the pain. °· Does it come and go or is it present all the time? °· Are there things that start the pain (eating certain foods, exercise)? °· Are there other symptoms associated with the pain (fever, nausea, vomiting, diarrhea)? °All of this is helpful to know when trying to find the cause of the pain. °CAUSES  °· Stomach: virus or bacteria infection, or ulcer. °· Intestine: appendicitis (inflamed appendix), regional ileitis (Crohn's disease), ulcerative colitis (inflamed colon), irritable bowel syndrome, diverticulitis (inflamed diverticulum of the colon), or cancer of the stomach or intestine. °· Gallbladder disease or stones in the gallbladder. °· Kidney disease, kidney stones, or infection. °· Pancreas infection or cancer. °· Fibromyalgia (pain disorder). °· Diseases of the female organs: °¨ Uterus: fibroid (non-cancerous) tumors or infection. °¨ Fallopian tubes: infection or tubal pregnancy. °¨ Ovary: cysts or tumors. °¨ Pelvic adhesions (scar tissue). °¨ Endometriosis (uterus lining tissue growing in the pelvis and on the pelvic organs). °¨ Pelvic congestion syndrome (female organs filling up with blood just before the menstrual period). °¨ Pain with the menstrual period. °¨ Pain with ovulation (producing an egg). °¨ Pain with an IUD (intrauterine device, birth control) in the uterus. °¨ Cancer of the female organs. °· Functional pain (pain not caused by a disease, may improve without treatment). °· Psychological pain. °· Depression. °DIAGNOSIS  °Your doctor will decide the seriousness of your pain by doing an examination. °· Blood tests. °· X-rays. °· Ultrasound. °· CT scan (computed tomography, special type of X-ray). °· MRI (magnetic resonance imaging). °· Cultures, for infection. °· Barium enema (dye inserted in the large intestine, to better view it with  X-rays). °· Colonoscopy (looking in intestine with a lighted tube). °· Laparoscopy (minor surgery, looking in abdomen with a lighted tube). °· Major abdominal exploratory surgery (looking in abdomen with a large incision). °TREATMENT  °The treatment will depend on the cause of the pain.  °· Many cases can be observed and treated at home. °· Over-the-counter medicines recommended by your caregiver. °· Prescription medicine. °· Antibiotics, for infection. °· Birth control pills, for painful periods or for ovulation pain. °· Hormone treatment, for endometriosis. °· Nerve blocking injections. °· Physical therapy. °· Antidepressants. °· Counseling with a psychologist or psychiatrist. °· Minor or major surgery. °HOME CARE INSTRUCTIONS  °· Do not take laxatives, unless directed by your caregiver. °· Take over-the-counter pain medicine only if ordered by your caregiver. Do not take aspirin because it can cause an upset stomach or bleeding. °· Try a clear liquid diet (broth or water) as ordered by your caregiver. Slowly move to a bland diet, as tolerated, if the pain is related to the stomach or intestine. °· Have a thermometer and take your temperature several times a day, and record it. °· Bed rest and sleep, if it helps the pain. °· Avoid sexual intercourse, if it causes pain. °· Avoid stressful situations. °· Keep your follow-up appointments and tests, as your caregiver orders. °· If the pain does not go away with medicine or surgery, you may try: °¨ Acupuncture. °¨ Relaxation exercises (yoga, meditation). °¨ Group therapy. °¨ Counseling. °SEEK MEDICAL CARE IF:  °· You notice certain foods cause stomach pain. °· Your home care treatment is not helping your pain. °· You need stronger pain medicine. °· You want your IUD removed. °· You feel faint or   lightheaded. °· You develop nausea and vomiting. °· You develop a rash. °· You are having side effects or an allergy to your medicine. °SEEK IMMEDIATE MEDICAL CARE IF:  °· Your  pain does not go away or gets worse. °· You have a fever. °· Your pain is felt only in portions of the abdomen. The right side could possibly be appendicitis. The left lower portion of the abdomen could be colitis or diverticulitis. °· You are passing blood in your stools (bright red or black tarry stools, with or without vomiting). °· You have blood in your urine. °· You develop chills, with or without a fever. °· You pass out. °MAKE SURE YOU:  °· Understand these instructions. °· Will watch your condition. °· Will get help right away if you are not doing well or get worse. °Document Released: 08/31/2007 Document Revised: 03/20/2014 Document Reviewed: 09/20/2009 °[employer]® Patient Information ©2015 [employer]. This information is not intended to replace advice given to you by your health care provider. Make sure you discuss any questions you have with your health care provider. ° °

## 2015-04-08 NOTE — ED Notes (Signed)
Pt c/o left knee pain at d/c and requesting something for pain. MD updated and orders received. Christina DittoGraham Crackers and Ginger Ale given with meds.

## 2015-04-09 LAB — URINE CULTURE
COLONY COUNT: NO GROWTH
Culture: NO GROWTH

## 2015-10-01 ENCOUNTER — Encounter (HOSPITAL_BASED_OUTPATIENT_CLINIC_OR_DEPARTMENT_OTHER): Payer: Self-pay | Admitting: Emergency Medicine

## 2015-10-01 ENCOUNTER — Emergency Department (HOSPITAL_BASED_OUTPATIENT_CLINIC_OR_DEPARTMENT_OTHER): Payer: 59

## 2015-10-01 ENCOUNTER — Emergency Department (HOSPITAL_BASED_OUTPATIENT_CLINIC_OR_DEPARTMENT_OTHER)
Admission: EM | Admit: 2015-10-01 | Discharge: 2015-10-02 | Disposition: A | Discharge status: Home / Self Care | Payer: 59 | Attending: Emergency Medicine | Admitting: Emergency Medicine

## 2015-10-01 DIAGNOSIS — F419 Anxiety disorder, unspecified: Secondary | ICD-10-CM | POA: Diagnosis not present

## 2015-10-01 DIAGNOSIS — Z7982 Long term (current) use of aspirin: Secondary | ICD-10-CM | POA: Diagnosis not present

## 2015-10-01 DIAGNOSIS — F329 Major depressive disorder, single episode, unspecified: Secondary | ICD-10-CM | POA: Diagnosis not present

## 2015-10-01 DIAGNOSIS — K219 Gastro-esophageal reflux disease without esophagitis: Secondary | ICD-10-CM | POA: Diagnosis not present

## 2015-10-01 DIAGNOSIS — R1084 Generalized abdominal pain: Secondary | ICD-10-CM | POA: Diagnosis present

## 2015-10-01 DIAGNOSIS — Z8701 Personal history of pneumonia (recurrent): Secondary | ICD-10-CM | POA: Diagnosis not present

## 2015-10-01 DIAGNOSIS — M1990 Unspecified osteoarthritis, unspecified site: Secondary | ICD-10-CM | POA: Diagnosis not present

## 2015-10-01 DIAGNOSIS — I4891 Unspecified atrial fibrillation: Secondary | ICD-10-CM | POA: Diagnosis not present

## 2015-10-01 DIAGNOSIS — Z90710 Acquired absence of both cervix and uterus: Secondary | ICD-10-CM | POA: Diagnosis not present

## 2015-10-01 DIAGNOSIS — K5732 Diverticulitis of large intestine without perforation or abscess without bleeding: Secondary | ICD-10-CM | POA: Insufficient documentation

## 2015-10-01 DIAGNOSIS — G8929 Other chronic pain: Secondary | ICD-10-CM | POA: Diagnosis not present

## 2015-10-01 DIAGNOSIS — Z862 Personal history of diseases of the blood and blood-forming organs and certain disorders involving the immune mechanism: Secondary | ICD-10-CM | POA: Insufficient documentation

## 2015-10-01 DIAGNOSIS — N182 Chronic kidney disease, stage 2 (mild): Secondary | ICD-10-CM | POA: Diagnosis not present

## 2015-10-01 DIAGNOSIS — M797 Fibromyalgia: Secondary | ICD-10-CM | POA: Insufficient documentation

## 2015-10-01 DIAGNOSIS — E213 Hyperparathyroidism, unspecified: Secondary | ICD-10-CM | POA: Insufficient documentation

## 2015-10-01 DIAGNOSIS — Z9851 Tubal ligation status: Secondary | ICD-10-CM | POA: Diagnosis not present

## 2015-10-01 DIAGNOSIS — Z9889 Other specified postprocedural states: Secondary | ICD-10-CM | POA: Diagnosis not present

## 2015-10-01 DIAGNOSIS — I129 Hypertensive chronic kidney disease with stage 1 through stage 4 chronic kidney disease, or unspecified chronic kidney disease: Secondary | ICD-10-CM | POA: Diagnosis not present

## 2015-10-01 DIAGNOSIS — Z79899 Other long term (current) drug therapy: Secondary | ICD-10-CM | POA: Diagnosis not present

## 2015-10-01 DIAGNOSIS — Z9049 Acquired absence of other specified parts of digestive tract: Secondary | ICD-10-CM | POA: Insufficient documentation

## 2015-10-01 DIAGNOSIS — J45909 Unspecified asthma, uncomplicated: Secondary | ICD-10-CM | POA: Insufficient documentation

## 2015-10-01 LAB — URINALYSIS, ROUTINE W REFLEX MICROSCOPIC
Glucose, UA: NEGATIVE mg/dL
HGB URINE DIPSTICK: NEGATIVE
Ketones, ur: 15 mg/dL — AB
Nitrite: NEGATIVE
PROTEIN: NEGATIVE mg/dL
Specific Gravity, Urine: 1.026 (ref 1.005–1.030)
Urobilinogen, UA: 0.2 mg/dL (ref 0.0–1.0)
pH: 5 (ref 5.0–8.0)

## 2015-10-01 LAB — COMPREHENSIVE METABOLIC PANEL
ALK PHOS: 114 U/L (ref 38–126)
ALT: 19 U/L (ref 14–54)
AST: 19 U/L (ref 15–41)
Albumin: 4.4 g/dL (ref 3.5–5.0)
Anion gap: 8 (ref 5–15)
BUN: 16 mg/dL (ref 6–20)
CO2: 22 mmol/L (ref 22–32)
CREATININE: 1.05 mg/dL — AB (ref 0.44–1.00)
Calcium: 10 mg/dL (ref 8.9–10.3)
Chloride: 103 mmol/L (ref 101–111)
GFR calc non Af Amer: 55 mL/min — ABNORMAL LOW (ref >60)
Glucose, Bld: 138 mg/dL — ABNORMAL HIGH (ref 65–99)
Potassium: 4.1 mmol/L (ref 3.5–5.1)
SODIUM: 133 mmol/L — AB (ref 135–145)
Total Bilirubin: 0.7 mg/dL (ref 0.3–1.2)
Total Protein: 8 g/dL (ref 6.5–8.1)

## 2015-10-01 LAB — URINE MICROSCOPIC-ADD ON

## 2015-10-01 LAB — CBC
HCT: 34.5 % — ABNORMAL LOW (ref 36.0–46.0)
Hemoglobin: 11.7 g/dL — ABNORMAL LOW (ref 12.0–15.0)
MCH: 28.7 pg (ref 26.0–34.0)
MCHC: 33.9 g/dL (ref 30.0–36.0)
MCV: 84.8 fL (ref 78.0–100.0)
PLATELETS: 316 10*3/uL (ref 150–400)
RBC: 4.07 MIL/uL (ref 3.87–5.11)
RDW: 12.9 % (ref 11.5–15.5)
WBC: 5.8 10*3/uL (ref 4.0–10.5)

## 2015-10-01 LAB — LIPASE, BLOOD: Lipase: 30 U/L (ref 11–51)

## 2015-10-01 MED ORDER — ONDANSETRON HCL 4 MG/2ML IJ SOLN: 4.0000 mg | Freq: Once | INTRAMUSCULAR | Status: AC

## 2015-10-01 MED ORDER — SODIUM CHLORIDE 0.9 % IV BOLUS (SEPSIS)
500.0000 mL | Freq: Once | INTRAVENOUS | Status: AC
  Administered 2015-10-01: 500 mL via INTRAVENOUS

## 2015-10-01 MED ORDER — MORPHINE SULFATE (PF) 4 MG/ML IV SOLN
4.0000 mg | Freq: Once | INTRAVENOUS | Status: AC
  Administered 2015-10-01: 4 mg via INTRAVENOUS
  Filled 2015-10-01: qty 1

## 2015-10-01 MED ORDER — ONDANSETRON HCL 4 MG/2ML IJ SOLN
4.0000 mg | Freq: Once | INTRAMUSCULAR | Status: AC
  Administered 2015-10-01: 4 mg via INTRAVENOUS
  Filled 2015-10-01: qty 2

## 2015-10-01 NOTE — ED Provider Notes (Signed)
CSN: [id number]     Arrival date & time 10/01/15  2205 History  By signing my name below, I, Budd PalmerVanessa Prueter, attest that this documentation has been prepared under the direction and in the presence of Loren Raceravid Jamare Vanatta, MD. Electronically Signed: Budd PalmerVanessa Prueter, ED Scribe. 10/01/2015. 11:46 PM.     Chief Complaint  Patient presents with  . Abdominal Pain   The history is provided by the patient. No language interpreter was used.   HPI Comments: Christina Vang is a 64 y.o. female with a PMHx of diverticulitis and GERD who presents to the Emergency Department complaining of worsening, diffuse abdominal pain onset 1 week ago. She notes the pain began on the left side of the abdomen, but has since spread to the right lower side as well as of 8 hours ago. She reports associated sour taste in her mouth, a white film on her tongue, nausea, lower to mid-back pain, mild diarrhea (yesterday), and constipation as of today. She notes she has not been able to pass gas since the pain spread across to the right side. She reports she was diagnosed with diverticulitis about 1 month ago, and states her pain has been worsening for the past week. She states she has been on Cipro and Flagyl for the past 2 weeks. She notes her Gastroenterologist has done an endoscopy and would also like to perform a colonoscopy. She also reports a PSHx of Nissen Fundoplication and hernia repair. Pt denies vomiting, dysuria, and frequency.  Pt is allergic to Augmentin, Codeine, Tetracycline, and Hydrocodone.   Past Medical History  Diagnosis Date  . Fibromyalgia   . Mitral valve prolapse   . Hypertension   . Hyperparathyroidism (HCC)   . GERD (gastroesophageal reflux disease)   . Depression   . High cholesterol   . Atrial fibrillation (HCC)   . Bronchial asthma   . Pneumonia X 2  . Anemia   . H/O hiatal hernia   . Daily headache     "recently" (07/31/2014)  . Arthritis   . Osteopenia   . Chronic back pain   . Anxiety    . Acute renal disease   . Chronic kidney disease (CKD), stage II (mild)    Past Surgical History  Procedure Laterality Date  . Nissen fundoplication  1980's  . Knee arthroscopy Left 1990's  . Cholecystectomy  ~ 2008  . Liver biopsy  1990's    "fatty liver"  . Tonsillectomy and adenoidectomy  ~ 1960  . Hernia repair    . Vaginal hysterectomy  1980's  . Tubal ligation  1972  . Cardiac catheterization      "several"  . Transurethral resection of bladder tumor with gyrus (turbt-gyrus)  ~ 12/2013   Family History  Problem Relation Age of Onset  . Hypertension Mother   . Hypertension Father   . Stroke Mother   . Stroke Father   . Diabetes Mother   . Diabetes Sister   . Diabetes Brother    Social History  Substance Use Topics  . Smoking status: Never Smoker   . Smokeless tobacco: Never Used  . Alcohol Use: No   OB History    No data available     Review of Systems  Constitutional: Negative for fever and chills.  Respiratory: Negative for shortness of breath.   Cardiovascular: Negative for chest pain.  Gastrointestinal: Positive for nausea, abdominal pain, diarrhea and constipation. Negative for vomiting and abdominal distention.  Genitourinary: Negative for dysuria, frequency, hematuria, flank  pain and pelvic pain.  Musculoskeletal: Positive for back pain. Negative for myalgias, neck pain and neck stiffness.  Skin: Negative for rash and wound.  Neurological: Negative for dizziness, weakness, light-headedness, numbness and headaches.  All other systems reviewed and are negative.   Allergies  Augmentin; Codeine; and Tetracyclines & related  Home Medications   Prior to Admission medications   Medication Sig Start Date End Date Taking? Authorizing Provider  albuterol (PROVENTIL HFA;VENTOLIN HFA) 108 (90 BASE) MCG/ACT inhaler Inhale 2 puffs into the lungs 2 (two) times daily as needed for wheezing or shortness of breath.    Historical Provider, MD  amLODipine (NORVASC)  10 MG tablet Take 1 tablet (10 mg total) by mouth daily. Patient taking differently: Take 5 mg by mouth daily.  08/08/14   Kathlen Mody, MD  aspirin EC 81 MG tablet Take 81 mg by mouth daily.    Historical Provider, MD  atorvastatin (LIPITOR) 20 MG tablet Take 20 mg by mouth at bedtime.     Historical Provider, MD  cetirizine (ZYRTEC) 10 MG tablet Take 10 mg by mouth daily.    Historical Provider, MD  diclofenac sodium (VOLTAREN) 1 % GEL Apply 1 application topically 2 (two) times daily as needed (pain).    Historical Provider, MD  docusate sodium 100 MG CAPS Take 200 mg by mouth 2 (two) times daily. 08/08/14   Kathlen Mody, MD  DULoxetine (CYMBALTA) 60 MG capsule Take 60 mg by mouth daily.    Historical Provider, MD  eplerenone (INSPRA) 25 MG tablet Take 25 mg by mouth daily.    Historical Provider, MD  eszopiclone (LUNESTA) 2 MG TABS tablet Take 2 mg by mouth at bedtime as needed for sleep. Take immediately before bedtime    Historical Provider, MD  HYDROcodone-acetaminophen (NORCO) 5-325 MG tablet Take 1-2 tablets by mouth every 6 (six) hours as needed. 10/02/15   Loren Racer, MD  labetalol (NORMODYNE) 100 MG tablet Take 100 mg by mouth 2 (two) times daily.    Historical Provider, MD  Lurasidone HCl 20 MG TABS Take by mouth.    Historical Provider, MD  olmesartan (BENICAR) 20 MG tablet Take 20 mg by mouth daily.    Historical Provider, MD  omega-3 acid ethyl esters (LOVAZA) 1 G capsule Take 1 g by mouth 2 (two) times daily.    Historical Provider, MD  omeprazole (PRILOSEC) 40 MG capsule Take 40 mg by mouth daily.    Historical Provider, MD  Omeprazole-Sodium Bicarbonate (ZEGERID) 20-1100 MG CAPS capsule Take 1 capsule by mouth daily before breakfast.    Historical Provider, MD  ondansetron (ZOFRAN) 4 MG tablet Take 1 tablet (4 mg total) by mouth every 8 (eight) hours as needed for nausea or vomiting. 10/02/15   Loren Racer, MD  potassium chloride (K-DUR,KLOR-CON) 10 MEQ tablet Take 10 mEq  by mouth daily.    Historical Provider, MD  ramelteon (ROZEREM) 8 MG tablet Take 1 tablet (8 mg total) by mouth at bedtime. 08/08/14   Kathlen Mody, MD  traMADol (ULTRAM) 50 MG tablet Take 1 tablet (50 mg total) by mouth every 6 (six) hours as needed for severe pain. 08/08/14   Kathlen Mody, MD  traZODone (DESYREL) 100 MG tablet Take 1 tablet (100 mg total) by mouth at bedtime. 08/08/14   Kathlen Mody, MD   BP 118/68 mmHg  Pulse 69  Temp(Src) 98.4 F (36.9 C) (Oral)  Resp 18  Ht 5\' 7"  (1.702 m)  Wt 193 lb 6 oz (87.714 kg)  BMI 30.28 kg/m2  SpO2 99% Physical Exam  Constitutional: She is oriented to person, place, and time. She appears well-developed and well-nourished. No distress.  HENT:  Head: Normocephalic and atraumatic.  Mouth/Throat: Oropharynx is clear and moist.  Eyes: EOM are normal. Pupils are equal, round, and reactive to light.  Neck: Normal range of motion. Neck supple.  Cardiovascular: Normal rate and regular rhythm.   Pulmonary/Chest: Effort normal and breath sounds normal. No respiratory distress. She has no wheezes. She has no rales. She exhibits no tenderness.  Abdominal: Soft. Bowel sounds are normal. She exhibits no distension and no mass. There is tenderness (ild diffuse abdominal tenderness worse in the left lower quadrant. There is no rebound or guarding.). There is no rebound and no guarding.  Musculoskeletal: Normal range of motion. She exhibits no edema or tenderness.  No CVA tenderness bilaterally.  Neurological: She is alert and oriented to person, place, and time.  Skin: Skin is warm and dry. No rash noted. No erythema.  Psychiatric: She has a normal mood and affect. Her behavior is normal.  Nursing note and vitals reviewed.   ED Course  Procedures  DIAGNOSTIC STUDIES: Oxygen Saturation is 100% on RA, normal by my interpretation.    COORDINATION OF CARE: 11:42 PM - Discussed plans to order diagnostic imaging and pain medication. Pt advised of plan for  treatment and pt agrees.  Labs Review Labs Reviewed  COMPREHENSIVE METABOLIC PANEL - Abnormal; Notable for the following:    Sodium 133 (*)    Glucose, Bld 138 (*)    Creatinine, Ser 1.05 (*)    GFR calc non Af Amer 55 (*)    All other components within normal limits  CBC - Abnormal; Notable for the following:    Hemoglobin 11.7 (*)    HCT 34.5 (*)    All other components within normal limits  URINALYSIS, ROUTINE W REFLEX MICROSCOPIC (NOT AT [facility]) - Abnormal; Notable for the following:    Color, Urine AMBER (*)    APPearance CLOUDY (*)    Bilirubin Urine SMALL (*)    Ketones, ur 15 (*)    Leukocytes, UA MODERATE (*)    All other components within normal limits  URINE MICROSCOPIC-ADD ON - Abnormal; Notable for the following:    Squamous Epithelial / LPF FEW (*)    Bacteria, UA FEW (*)    All other components within normal limits  LIPASE, BLOOD    Imaging Review Ct Abdomen Pelvis W Contrast  10/02/2015  CLINICAL DATA:  Diffuse and lower abdominal pain for 1 week. Abdominal swelling for 2 days. Diagnosed with diverticulitis 1 month prior. EXAM: CT ABDOMEN AND PELVIS WITH CONTRAST TECHNIQUE: Multidetector CT imaging of the abdomen and pelvis was performed using the standard protocol following bolus administration of intravenous contrast. CONTRAST:  50mL OMNIPAQUE IOHEXOL 300 MG/ML SOLN, OMNIPAQUE IOHEXOL 300 MG/ML SOLN COMPARISON:  CT 04/08/2015 FINDINGS: Lower chest: The included lung bases are clear. Tiny benign nodule the left lung base, unchanged. Liver: Unchanged cyst in the left lobe of the liver measuring 2.7 cm. Additional smaller subcentimeter hypodensities scattered throughout, too small to characterize. Minimal fatty infiltration adjacent with falciform ligament. No suspicious hepatic lesion. Hepatobiliary: Clips in the gallbladder fossa postcholecystectomy. No biliary dilatation. Pancreas: Mild atrophy without ductal dilatation, focal lesion or surrounding inflammatory  change. Spleen: Normal. Adrenal glands: No nodule. Kidneys: Symmetric renal enhancement and excretion. No hydronephrosis. Mild asymmetry in renal size with right larger than left, however no focal abnormality.  Stomach/Bowel: Stomach and distended with ingested contrast. Post Nissen fundoplication with surgical clips at the gastroesophageal junction. There is a small paraesophageal hernia involving the gastric cardia adjacent to the distal esophagus without wall thickening. There are no dilated or thickened small bowel loops. Diverticulosis throughout the entire colon, with significant diverticular burden involving the sigmoid colon. Minimal inflammatory change throughout to a prominent diverticula in the sigmoid colon consistent with acute diverticulitis. No perforation or abscess. The appendix is not confidently identified. Vascular/Lymphatic: No retroperitoneal adenopathy. Abdominal aorta is normal in caliber. Minimal atherosclerosis without aneurysm. Reproductive: Uterus is surgically absent.  No adnexal mass. Bladder: Decompressed and not well evaluated. Other: No free air, free fluid, or intra-abdominal fluid collection. Tiny fat containing supraumbilical ventral abdominal wall hernia, no bowel involvement. Musculoskeletal: There are no acute or suspicious osseous abnormalities. Stable anterolisthesis of L5 on S1 with facet arthropathy. IMPRESSION: 1. Mild acute diverticulitis involving the sigmoid colon. No perforation or abscess. 2. Additional stable chronic findings as described. Electronically Signed   By: Rubye Oaks M.D.   On: 10/02/2015 02:00   I have personally reviewed and evaluated these images and lab results as part of my medical decision-making.   EKG Interpretation None      MDM   Final diagnoses:  Diverticulitis of large intestine without perforation or abscess without bleeding    I personally performed the services described in this documentation, which was scribed in my  presence. The recorded information has been reviewed and is accurate.   Patient with normal vital signs in the emergency department. She is afebrile. She has a normal white blood cell count. CT scan of the abdomen shows mild diverticulitis without abscess or perforation. The patient's symptoms are improved with small doses of morphine. I believe the patient to be discharged home safely. She is to continue taking her antibiotics and follow up with her gastroenterologist. She's been given return precautions and is voiced understanding.  Loren Racer, MD 10/02/15 [phone]

## 2015-10-01 NOTE — ED Notes (Signed)
Patient states that she is being treated for diverticulitis. The patient reports that she has started to have pain to her right lower abdominal area - which is abnormal for her

## 2015-10-02 MED ORDER — IOHEXOL 300 MG/ML  SOLN
50.0000 mL | Freq: Once | INTRAMUSCULAR | Status: AC | PRN
  Administered 2015-10-02: 50 mL via ORAL

## 2015-10-02 MED ORDER — IOHEXOL 300 MG/ML  SOLN
100.0000 mL | Freq: Once | INTRAMUSCULAR | Status: AC | PRN
Start: 2015-10-02 — End: 2015-10-02
  Administered 2015-10-02: 100 mL via INTRAVENOUS

## 2015-10-02 MED ORDER — ONDANSETRON HCL 4 MG PO TABS: 4.0000 mg | ORAL_TABLET | Freq: Three times a day (TID) | ORAL | Status: DC | PRN

## 2015-10-02 MED ORDER — HYDROCODONE-ACETAMINOPHEN 5-325 MG PO TABS: 1.0000 | ORAL_TABLET | Freq: Four times a day (QID) | ORAL | Status: DC | PRN

## 2015-10-02 MED ORDER — MORPHINE SULFATE (PF) 4 MG/ML IV SOLN
4.0000 mg | Freq: Once | INTRAVENOUS | Status: AC
  Administered 2015-10-02: 4 mg via INTRAVENOUS
  Filled 2015-10-02: qty 1

## 2015-10-02 NOTE — Discharge Instructions (Signed)
Follow-up with your gastroenterologist and primary physician. Return immediately for worsening pain, persistent vomiting, fever or any concerns  Diverticulitis Diverticulitis is when small pockets that have formed in your colon (large intestine) become infected or swollen. HOME CARE  Follow your doctor's instructions.  Follow a special diet if told by your doctor.  When you feel better, your doctor may tell you to change your diet. You may be told to eat a lot of fiber. Fruits and vegetables are good sources of fiber. Fiber makes it easier to poop (have bowel movements).  Take supplements or probiotics as told by your doctor.  Only take medicines as told by your doctor.  Keep all follow-up visits with your doctor. GET HELP IF:  Your pain does not get better.  You have a hard time eating food.  You are not pooping like normal. GET HELP RIGHT AWAY IF:  Your pain gets worse.  Your problems do not get better.  Your problems suddenly get worse.  You have a fever.  You keep throwing up (vomiting).  You have bloody or black, tarry poop (stool). MAKE SURE YOU:   Understand these instructions.  Will watch your condition.  Will get help right away if you are not doing well or get worse.   This information is not intended to replace advice given to you by your health care provider. Make sure you discuss any questions you have with your health care provider.   Document Released: 04/21/2008 Document Revised: 11/08/2013 Document Reviewed: 09/28/2013 [employer] Interactive Patient Education [employer]2016 [employer]

## 2015-10-31 ENCOUNTER — Encounter (HOSPITAL_BASED_OUTPATIENT_CLINIC_OR_DEPARTMENT_OTHER): Payer: Self-pay | Admitting: *Deleted

## 2015-10-31 ENCOUNTER — Emergency Department (HOSPITAL_BASED_OUTPATIENT_CLINIC_OR_DEPARTMENT_OTHER): Payer: 59

## 2015-10-31 ENCOUNTER — Emergency Department (HOSPITAL_BASED_OUTPATIENT_CLINIC_OR_DEPARTMENT_OTHER)
Admission: EM | Admit: 2015-10-31 | Discharge: 2015-10-31 | Disposition: A | Discharge status: Home / Self Care | Payer: 59 | Attending: Emergency Medicine | Admitting: Emergency Medicine

## 2015-10-31 DIAGNOSIS — R739 Hyperglycemia, unspecified: Secondary | ICD-10-CM | POA: Insufficient documentation

## 2015-10-31 DIAGNOSIS — F329 Major depressive disorder, single episode, unspecified: Secondary | ICD-10-CM | POA: Diagnosis not present

## 2015-10-31 DIAGNOSIS — Z8701 Personal history of pneumonia (recurrent): Secondary | ICD-10-CM | POA: Insufficient documentation

## 2015-10-31 DIAGNOSIS — J159 Unspecified bacterial pneumonia: Secondary | ICD-10-CM | POA: Diagnosis not present

## 2015-10-31 DIAGNOSIS — Z7982 Long term (current) use of aspirin: Secondary | ICD-10-CM | POA: Diagnosis not present

## 2015-10-31 DIAGNOSIS — R05 Cough: Secondary | ICD-10-CM | POA: Diagnosis present

## 2015-10-31 DIAGNOSIS — J45909 Unspecified asthma, uncomplicated: Secondary | ICD-10-CM | POA: Diagnosis not present

## 2015-10-31 DIAGNOSIS — F419 Anxiety disorder, unspecified: Secondary | ICD-10-CM | POA: Insufficient documentation

## 2015-10-31 DIAGNOSIS — Z79899 Other long term (current) drug therapy: Secondary | ICD-10-CM | POA: Diagnosis not present

## 2015-10-31 DIAGNOSIS — N182 Chronic kidney disease, stage 2 (mild): Secondary | ICD-10-CM | POA: Diagnosis not present

## 2015-10-31 DIAGNOSIS — G8929 Other chronic pain: Secondary | ICD-10-CM | POA: Diagnosis not present

## 2015-10-31 DIAGNOSIS — K219 Gastro-esophageal reflux disease without esophagitis: Secondary | ICD-10-CM | POA: Insufficient documentation

## 2015-10-31 DIAGNOSIS — M1990 Unspecified osteoarthritis, unspecified site: Secondary | ICD-10-CM | POA: Insufficient documentation

## 2015-10-31 DIAGNOSIS — Z9889 Other specified postprocedural states: Secondary | ICD-10-CM | POA: Diagnosis not present

## 2015-10-31 DIAGNOSIS — J189 Pneumonia, unspecified organism: Secondary | ICD-10-CM

## 2015-10-31 DIAGNOSIS — I129 Hypertensive chronic kidney disease with stage 1 through stage 4 chronic kidney disease, or unspecified chronic kidney disease: Secondary | ICD-10-CM | POA: Insufficient documentation

## 2015-10-31 DIAGNOSIS — E7800 Pure hypercholesterolemia, unspecified: Secondary | ICD-10-CM | POA: Insufficient documentation

## 2015-10-31 DIAGNOSIS — M797 Fibromyalgia: Secondary | ICD-10-CM | POA: Insufficient documentation

## 2015-10-31 DIAGNOSIS — D649 Anemia, unspecified: Secondary | ICD-10-CM | POA: Insufficient documentation

## 2015-10-31 LAB — CBC WITH DIFFERENTIAL/PLATELET
BASOS ABS: 0 10*3/uL (ref 0.0–0.1)
Basophils Relative: 0 %
EOS ABS: 0 10*3/uL (ref 0.0–0.7)
Eosinophils Relative: 0 %
HEMATOCRIT: 31.1 % — AB (ref 36.0–46.0)
HEMOGLOBIN: 10.8 g/dL — AB (ref 12.0–15.0)
LYMPHS PCT: 25 %
Lymphs Abs: 1.9 10*3/uL (ref 0.7–4.0)
MCH: 28.8 pg (ref 26.0–34.0)
MCHC: 34.7 g/dL (ref 30.0–36.0)
MCV: 82.9 fL (ref 78.0–100.0)
Monocytes Absolute: 0.5 10*3/uL (ref 0.1–1.0)
Monocytes Relative: 7 %
NEUTROS PCT: 68 %
Neutro Abs: 5 10*3/uL (ref 1.7–7.7)
Platelets: 313 10*3/uL (ref 150–400)
RBC: 3.75 MIL/uL — ABNORMAL LOW (ref 3.87–5.11)
RDW: 12.8 % (ref 11.5–15.5)
WBC: 7.4 10*3/uL (ref 4.0–10.5)

## 2015-10-31 LAB — BASIC METABOLIC PANEL
ANION GAP: 10 (ref 5–15)
BUN: 21 mg/dL — ABNORMAL HIGH (ref 6–20)
CALCIUM: 10 mg/dL (ref 8.9–10.3)
CO2: 23 mmol/L (ref 22–32)
Chloride: 99 mmol/L — ABNORMAL LOW (ref 101–111)
Creatinine, Ser: 0.9 mg/dL (ref 0.44–1.00)
Glucose, Bld: 140 mg/dL — ABNORMAL HIGH (ref 65–99)
Potassium: 4.8 mmol/L (ref 3.5–5.1)
SODIUM: 132 mmol/L — AB (ref 135–145)

## 2015-10-31 MED ORDER — IPRATROPIUM-ALBUTEROL 0.5-2.5 (3) MG/3ML IN SOLN
3.0000 mL | Freq: Once | RESPIRATORY_TRACT | Status: AC
  Administered 2015-10-31: 3 mL via RESPIRATORY_TRACT
  Filled 2015-10-31: qty 3

## 2015-10-31 MED ORDER — ALBUTEROL SULFATE (2.5 MG/3ML) 0.083% IN NEBU
2.5000 mg | INHALATION_SOLUTION | Freq: Once | RESPIRATORY_TRACT | Status: AC
  Administered 2015-10-31: 2.5 mg via RESPIRATORY_TRACT
  Filled 2015-10-31: qty 3

## 2015-10-31 MED ORDER — SODIUM CHLORIDE 0.9 % IV BOLUS (SEPSIS)
1000.0000 mL | Freq: Once | INTRAVENOUS | Status: AC
  Administered 2015-10-31: 1000 mL via INTRAVENOUS

## 2015-10-31 NOTE — ED Provider Notes (Addendum)
CSN: [id number]     Arrival date & time 10/31/15  2030 History  By signing my name below, I, Christina Vang, attest that this documentation has been prepared under the direction and in the presence of Christina Sou, MD. Electronically Signed: Budd Vang, ED Scribe. 10/31/2015. 9:23 PM.    Chief Complaint  Patient presents with  . URI   The history is provided by the patient. No language interpreter was used.   HPI Comments: Christina Vang is a 64 y.o. female with a PMHx of HTN, mitral valve prolapse, A-fib, chronic back pain, bronchial asthma, PNA, and high cholesterol who presents to the Emergency Department complaining of a URI onset 1.5 weeks ago. She reports associated non-productive cough (onset 1 week ago), fever (Tmax 102.7, resolved 2 days ago), nausea, vomiting (4 episodes, onset last week, lasting for 4 days), HA, sore throat, back pain, lightheadedness when standing up, possible dehydration, and weight loss (7-8 lbs since falling ill). She notes she was seen by her PCP 2 days ago where she was given a breathing treatment with mild relief, as well as prednisone (40 mg) and zythromycin. She also reports that lab work was done at that time, and that she was notified her sodium was low. She notes she has been using an albuterol inhaler and nebulizer at home with mild relief. She reports a PMHx of bronchial asthma. She also reports intermittent wheezing on exertion, onset 1 year ago, which she believes may be due to her living on the 3rd floor. She denies a PMHx of CHF and MI. Pt denies generalized myalgias.  Pt is allergic to Augmentin, codeine, and tetracyclines.  Past Medical History  Diagnosis Date  . Fibromyalgia   . Mitral valve prolapse   . Hypertension   . Hyperparathyroidism (HCC)   . GERD (gastroesophageal reflux disease)   . Depression   . High cholesterol   . Atrial fibrillation (HCC)   . Bronchial asthma   . Pneumonia X 2  . Anemia   . H/O hiatal hernia   .  Daily headache     "recently" (07/31/2014)  . Arthritis   . Osteopenia   . Chronic back pain   . Anxiety   . Acute renal disease   . Chronic kidney disease (CKD), stage II (mild)    Past Surgical History  Procedure Laterality Date  . Nissen fundoplication  1980's  . Knee arthroscopy Left 1990's  . Cholecystectomy  ~ 2008  . Liver biopsy  1990's    "fatty liver"  . Tonsillectomy and adenoidectomy  ~ 1960  . Hernia repair    . Vaginal hysterectomy  1980's  . Tubal ligation  1972  . Cardiac catheterization      "several"  . Transurethral resection of bladder tumor with gyrus (turbt-gyrus)  ~ 12/2013   Family History  Problem Relation Age of Onset  . Hypertension Mother   . Hypertension Father   . Stroke Mother   . Stroke Father   . Diabetes Mother   . Diabetes Sister   . Diabetes Brother    Social History  Substance Use Topics  . Smoking status: Never Smoker   . Smokeless tobacco: Never Used  . Alcohol Use: No   OB History    No data available     Review of Systems  Constitutional: Negative.   HENT: Negative.   Respiratory: Positive for cough.   Cardiovascular: Negative.   Gastrointestinal: Negative.   Musculoskeletal: Negative.   Skin: Negative.  Neurological: Negative.   Psychiatric/Behavioral: Negative.   All other systems reviewed and are negative.   Allergies  Augmentin; Codeine; and Tetracyclines & related  Home Medications   Prior to Admission medications   Medication Sig Start Date End Date Taking? Authorizing Provider  albuterol (PROVENTIL HFA;VENTOLIN HFA) 108 (90 BASE) MCG/ACT inhaler Inhale 2 puffs into the lungs 2 (two) times daily as needed for wheezing or shortness of breath.    Historical Provider, MD  amLODipine (NORVASC) 10 MG tablet Take 1 tablet (10 mg total) by mouth daily. Patient taking differently: Take 5 mg by mouth daily.  08/08/14   Christina Mody, MD  aspirin EC 81 MG tablet Take 81 mg by mouth daily.    Historical Provider, MD   atorvastatin (LIPITOR) 20 MG tablet Take 20 mg by mouth at bedtime.     Historical Provider, MD  cetirizine (ZYRTEC) 10 MG tablet Take 10 mg by mouth daily.    Historical Provider, MD  diclofenac sodium (VOLTAREN) 1 % GEL Apply 1 application topically 2 (two) times daily as needed (pain).    Historical Provider, MD  docusate sodium 100 MG CAPS Take 200 mg by mouth 2 (two) times daily. 08/08/14   Christina Mody, MD  DULoxetine (CYMBALTA) 60 MG capsule Take 60 mg by mouth daily.    Historical Provider, MD  eplerenone (INSPRA) 25 MG tablet Take 25 mg by mouth daily.    Historical Provider, MD  eszopiclone (LUNESTA) 2 MG TABS tablet Take 2 mg by mouth at bedtime as needed for sleep. Take immediately before bedtime    Historical Provider, MD  HYDROcodone-acetaminophen (NORCO) 5-325 MG tablet Take 1-2 tablets by mouth every 6 (six) hours as needed. 10/02/15   Loren Racer, MD  labetalol (NORMODYNE) 100 MG tablet Take 100 mg by mouth 2 (two) times daily.    Historical Provider, MD  Lurasidone HCl 20 MG TABS Take by mouth.    Historical Provider, MD  olmesartan (BENICAR) 20 MG tablet Take 20 mg by mouth daily.    Historical Provider, MD  omega-3 acid ethyl esters (LOVAZA) 1 G capsule Take 1 g by mouth 2 (two) times daily.    Historical Provider, MD  omeprazole (PRILOSEC) 40 MG capsule Take 40 mg by mouth daily.    Historical Provider, MD  Omeprazole-Sodium Bicarbonate (ZEGERID) 20-1100 MG CAPS capsule Take 1 capsule by mouth daily before breakfast.    Historical Provider, MD  ondansetron (ZOFRAN) 4 MG tablet Take 1 tablet (4 mg total) by mouth every 8 (eight) hours as needed for nausea or vomiting. 10/02/15   Loren Racer, MD  potassium chloride (K-DUR,KLOR-CON) 10 MEQ tablet Take 10 mEq by mouth daily.    Historical Provider, MD  ramelteon (ROZEREM) 8 MG tablet Take 1 tablet (8 mg total) by mouth at bedtime. 08/08/14   Christina Mody, MD  traMADol (ULTRAM) 50 MG tablet Take 1 tablet (50 mg total) by  mouth every 6 (six) hours as needed for severe pain. 08/08/14   Christina Mody, MD  traZODone (DESYREL) 100 MG tablet Take 1 tablet (100 mg total) by mouth at bedtime. 08/08/14   Christina Mody, MD   BP 155/80 mmHg  Pulse 78  Temp(Src) 98.4 F (36.9 C)  Resp 20  Ht  (1.702 m)  Wt 193 lb (87.544 kg)  BMI 30.22 kg/m2  SpO2 95% Physical Exam  Constitutional: She appears well-developed and well-nourished.  HENT:  Head: Normocephalic and atraumatic.  Eyes: Conjunctivae are normal. Pupils are equal,  round, and reactive to light.  Neck: Neck supple. No tracheal deviation present. No thyromegaly present.  Cardiovascular: Normal rate and regular rhythm.   No murmur heard. Pulmonary/Chest: Effort normal and breath sounds normal.  coughing  Abdominal: Soft. Bowel sounds are normal. She exhibits no distension. There is no tenderness.  obese  Musculoskeletal: Normal range of motion. She exhibits no edema or tenderness.  Neurological: She is alert. Coordination normal.  Skin: Skin is warm and dry. No rash noted.  Psychiatric: She has a normal mood and affect.  Nursing note and vitals reviewed.   ED Course  Procedures  DIAGNOSTIC STUDIES: Oxygen Saturation is 98% on RA, normal by my interpretation.    COORDINATION OF CARE: 9:10 PM - Discussed plans to order a breathing treatment and IV fluids, as well as to wait on chest XR results. Pt advised of plan for treatment and pt agrees.  Labs Review Labs Reviewed  BASIC METABOLIC PANEL - Abnormal; Notable for the following:    Sodium 132 (*)    Chloride 99 (*)    Glucose, Bld 140 (*)    BUN 21 (*)    All other components within normal limits  CBC WITH DIFFERENTIAL/PLATELET - Abnormal; Notable for the following:    RBC 3.75 (*)    Hemoglobin 10.8 (*)    HCT 31.1 (*)    All other components within normal limits    Imaging Review Dg Chest 2 View  10/31/2015  CLINICAL DATA:  Four day history of cough EXAM: CHEST  2 VIEW COMPARISON:  But  it January 03, 2014 FINDINGS: There is patchy infiltrate in the left mid and lower lung zones. Lungs elsewhere clear. Heart size and pulmonary vascular normal. No adenopathy. There is degenerative change in the thoracic spine. IMPRESSION: Patchy infiltrate consistent with pneumonia in the left mid and lower lung zones. Lungs elsewhere clear. Cardiac silhouette within normal limits. Followup PA and lateral chest radiographs recommended in 3-4 weeks following trial of antibiotic therapy to ensure resolution and exclude underlying malignancy. Electronically Signed   By: Bretta BangWilliam  Woodruff III M.D.   On: 10/31/2015 21:10   I have personally reviewed and evaluated these images and lab results as part of my medical decision-making.   EKG Interpretation None     Patient feels improved after nebulized treatment. She has albuterol nebulizer at home. Patient feels ready to go home after treatment with intravenous fluids and nebulized treatment. Chest x-ray reviewed by me Results for orders placed or performed during the hospital encounter of 10/31/15  Basic metabolic panel  Result Value Ref Range   Sodium 132 (L) 135 - 145 mmol/L   Potassium 4.8 3.5 - 5.1 mmol/L   Chloride 99 (L) 101 - 111 mmol/L   CO2 23 22 - 32 mmol/L   Glucose, Bld 140 (H) 65 - 99 mg/dL   BUN 21 (H) 6 - 20 mg/dL   Creatinine, Ser 1.610.90 0.44 - 1.00 mg/dL   Calcium 09.610.0 8.9 - 04.510.3 mg/dL   GFR calc non Af Amer >60 >60 mL/min   GFR calc Af Amer >60 >60 mL/min   Anion gap 10 5 - 15  CBC with Differential/Platelet  Result Value Ref Range   WBC 7.4 4.0 - 10.5 K/uL   RBC 3.75 (L) 3.87 - 5.11 MIL/uL   Hemoglobin 10.8 (L) 12.0 - 15.0 g/dL   HCT 40.931.1 (L) 81.136.0 - 91.446.0 %   MCV 82.9 78.0 - 100.0 fL   MCH 28.8 26.0 - 34.0  pg   MCHC 34.7 30.0 - 36.0 g/dL   RDW 16.1 09.6 - 04.5 %   Platelets 313 150 - 400 K/uL   Neutrophils Relative % 68 %   Lymphocytes Relative 25 %   Monocytes Relative 7 %   Eosinophils Relative 0 %   Basophils  Relative 0 %   Neutro Abs 5.0 1.7 - 7.7 K/uL   Lymphs Abs 1.9 0.7 - 4.0 K/uL   Monocytes Absolute 0.5 0.1 - 1.0 K/uL   Eosinophils Absolute 0.0 0.0 - 0.7 K/uL   Basophils Absolute 0.0 0.0 - 0.1 K/uL   Dg Chest 2 View  10/31/2015  CLINICAL DATA:  Four day history of cough EXAM: CHEST  2 VIEW COMPARISON:  But it January 03, 2014 FINDINGS: There is patchy infiltrate in the left mid and lower lung zones. Lungs elsewhere clear. Heart size and pulmonary vascular normal. No adenopathy. There is degenerative change in the thoracic spine. IMPRESSION: Patchy infiltrate consistent with pneumonia in the left mid and lower lung zones. Lungs elsewhere clear. Cardiac silhouette within normal limits. Followup PA and lateral chest radiographs recommended in 3-4 weeks following trial of antibiotic therapy to ensure resolution and exclude underlying malignancy. Electronically Signed   By: Bretta Bang III M.D.   On: 10/31/2015 21:10    MDM  She has not yet completed her course of Zithromax. I don't feel the patient has failed antibiotics. Final diagnoses:  None   PSI/PORT score =54 Plan finish azithromycin. Albuterol nebulizer treatment every 4 hours as needed for shortness of breath Return if needed more than every 4 hours or see PMD Suggest follow-up with PMD and request hemoglobin A1c Blood pressure recheck within the next 3 weeks Diagnoses #1 community-acquired pneumonia #2 hyperglycemia #3 anemia #4 elevated blood pressure       Christina Sou, MD 10/31/15 2238  Christina Sou, MD  720 am 11/20/15 I called flow manager to advise her to contact pt that she nees repeat cxr to ensure clearing . Cxr can be ordered by pt's pmd 11/20/15 0714  Christina Sou, MD 11/20/15 [phone]

## 2015-10-31 NOTE — ED Notes (Addendum)
Pt c/o URI symptoms   x 4 days seen by PMD yesterday started on ABX,labs,  no chest xray, pt states office called her today "K low"

## 2015-10-31 NOTE — ED Notes (Signed)
Pt has had a cough for a week and a half.  She saw her PCP on Monday and was put on azithromycin and prednisone but is unsure as to what the abx are for.  She states she had a temp last week and Monday.  Strong productive cough, states mucus is clear, unable to take deep breath without coughing.  She has fine inspiratory and expiratory wheezes, states her last breathing treatment was at 1400 today.  She also states that her PCP told her her sodium was low, but is unsure as to how low.

## 2015-10-31 NOTE — ED Notes (Signed)
Pt verbalizes understanding of d/c instructions and denies any further needs at this time. 

## 2015-10-31 NOTE — Discharge Instructions (Signed)
Community-Acquired Pneumonia, Adult  Ask Your primary care physician to order a repeat chest x-ray in 3 or 4 weeks to make sure that pneumonia has resolved. Your blood sugar is mildly elevated today at 140. Asked her primary care physician to order tests known as hemoglobin A1c to check you for diabetes. You're also mildly anemic hemoglobin is 10.8. Your blood pressure today is mildly elevated at 158/80. Your blood pressure should be rechecked in approximately 3 weeks at your doctor's office. Use your albuterol nebulizer every 4 hours as needed for shortness of breath. Return if needed more than every 4 hours. Make sure that you finish the azithromycin prescribed to you by your doctor. Return if you feel worse for any reason. Pneumonia is an infection of the lungs. One type of pneumonia can happen while a person is in a hospital. A different type can happen when a person is not in a hospital (community-acquired pneumonia). It is easy for this kind to spread from person to person. It can spread to you if you breathe near an infected person who coughs or sneezes. Some symptoms include:  A dry cough.  A wet (productive) cough.  Fever.  Sweating.  Chest pain. HOME CARE  Take over-the-counter and prescription medicines only as told by your doctor.  Only take cough medicine if you are losing sleep.  If you were prescribed an antibiotic medicine, take it as told by your doctor. Do not stop taking the antibiotic even if you start to feel better.  Sleep with your head and neck raised (elevated). You can do this by putting a few pillows under your head, or you can sleep in a recliner.  Do not use tobacco products. These include cigarettes, chewing tobacco, and e-cigarettes. If you need help quitting, ask your doctor.  Drink enough water to keep your pee (urine) clear or pale yellow. A shot (vaccine) can help prevent pneumonia. Shots are often suggested for:  People older than 64 years of  age.  People older than 64 years of age:  Who are having cancer treatment.  Who have long-term (chronic) lung disease.  Who have problems with their body's defense system (immune system). You may also prevent pneumonia if you take these actions:  Get the flu (influenza) shot every year.  Go to the dentist as often as told.  Wash your hands often. If soap and water are not available, use hand sanitizer. GET HELP IF:  You have a fever.  You lose sleep because your cough medicine does not help. GET HELP RIGHT AWAY IF:  You are short of breath and it gets worse.  You have more chest pain.  Your sickness gets worse. This is very serious if:  You are an older adult.  Your body's defense system is weak.  You cough up blood.   This information is not intended to replace advice given to you by your health care provider. Make sure you discuss any questions you have with your health care provider.   Document Released: 04/21/2008 Document Revised: 07/25/2015 Document Reviewed: 02/28/2015 Elsevier Interactive Patient Education [employer]2016 [employer] Inc.

## 2015-10-31 NOTE — ED Notes (Signed)
Nurse first-O2 sat 100%-HR 70-RR 24

## 2015-10-31 NOTE — ED Notes (Signed)
Patient transported to X-ray 

## 2015-11-20 ENCOUNTER — Telehealth (HOSPITAL_BASED_OUTPATIENT_CLINIC_OR_DEPARTMENT_OTHER): Payer: Self-pay | Admitting: Emergency Medicine

## 2015-12-15 ENCOUNTER — Telehealth (HOSPITAL_COMMUNITY): Payer: Self-pay

## 2015-12-15 NOTE — Telephone Encounter (Signed)
Unable to contact pt by mail or telephone. Unable to communicate lab results or treatment changes. 

## 2015-12-17 ENCOUNTER — Emergency Department (HOSPITAL_BASED_OUTPATIENT_CLINIC_OR_DEPARTMENT_OTHER): Payer: Self-pay

## 2015-12-17 ENCOUNTER — Inpatient Hospital Stay (HOSPITAL_BASED_OUTPATIENT_CLINIC_OR_DEPARTMENT_OTHER)
Admission: EM | Admit: 2015-12-17 | Discharge: 2015-12-21 | DRG: 391 | Disposition: A | Discharge status: Home / Self Care | Payer: Self-pay | Attending: Internal Medicine | Admitting: Internal Medicine

## 2015-12-17 ENCOUNTER — Encounter (HOSPITAL_BASED_OUTPATIENT_CLINIC_OR_DEPARTMENT_OTHER): Payer: Self-pay | Admitting: Emergency Medicine

## 2015-12-17 DIAGNOSIS — I480 Paroxysmal atrial fibrillation: Secondary | ICD-10-CM | POA: Diagnosis present

## 2015-12-17 DIAGNOSIS — A419 Sepsis, unspecified organism: Principal | ICD-10-CM | POA: Diagnosis present

## 2015-12-17 DIAGNOSIS — D649 Anemia, unspecified: Secondary | ICD-10-CM | POA: Diagnosis present

## 2015-12-17 DIAGNOSIS — Z833 Family history of diabetes mellitus: Secondary | ICD-10-CM

## 2015-12-17 DIAGNOSIS — K5732 Diverticulitis of large intestine without perforation or abscess without bleeding: Secondary | ICD-10-CM | POA: Diagnosis present

## 2015-12-17 DIAGNOSIS — K219 Gastro-esophageal reflux disease without esophagitis: Secondary | ICD-10-CM | POA: Diagnosis present

## 2015-12-17 DIAGNOSIS — Z7982 Long term (current) use of aspirin: Secondary | ICD-10-CM

## 2015-12-17 DIAGNOSIS — F329 Major depressive disorder, single episode, unspecified: Secondary | ICD-10-CM | POA: Diagnosis present

## 2015-12-17 DIAGNOSIS — G8929 Other chronic pain: Secondary | ICD-10-CM | POA: Diagnosis present

## 2015-12-17 DIAGNOSIS — E876 Hypokalemia: Secondary | ICD-10-CM | POA: Diagnosis present

## 2015-12-17 DIAGNOSIS — Z8249 Family history of ischemic heart disease and other diseases of the circulatory system: Secondary | ICD-10-CM

## 2015-12-17 DIAGNOSIS — M797 Fibromyalgia: Secondary | ICD-10-CM | POA: Diagnosis present

## 2015-12-17 DIAGNOSIS — F419 Anxiety disorder, unspecified: Secondary | ICD-10-CM | POA: Diagnosis present

## 2015-12-17 DIAGNOSIS — K529 Noninfective gastroenteritis and colitis, unspecified: Secondary | ICD-10-CM

## 2015-12-17 DIAGNOSIS — Z823 Family history of stroke: Secondary | ICD-10-CM

## 2015-12-17 DIAGNOSIS — M549 Dorsalgia, unspecified: Secondary | ICD-10-CM | POA: Diagnosis present

## 2015-12-17 DIAGNOSIS — N182 Chronic kidney disease, stage 2 (mild): Secondary | ICD-10-CM | POA: Diagnosis present

## 2015-12-17 DIAGNOSIS — I129 Hypertensive chronic kidney disease with stage 1 through stage 4 chronic kidney disease, or unspecified chronic kidney disease: Secondary | ICD-10-CM | POA: Diagnosis present

## 2015-12-17 DIAGNOSIS — R740 Nonspecific elevation of levels of transaminase and lactic acid dehydrogenase [LDH]: Secondary | ICD-10-CM | POA: Diagnosis present

## 2015-12-17 DIAGNOSIS — I10 Essential (primary) hypertension: Secondary | ICD-10-CM | POA: Diagnosis present

## 2015-12-17 DIAGNOSIS — K5792 Diverticulitis of intestine, part unspecified, without perforation or abscess without bleeding: Secondary | ICD-10-CM | POA: Diagnosis present

## 2015-12-17 HISTORY — DX: Diverticulitis of intestine, part unspecified, without perforation or abscess without bleeding: K57.92

## 2015-12-17 LAB — URINALYSIS, ROUTINE W REFLEX MICROSCOPIC
Bilirubin Urine: NEGATIVE
GLUCOSE, UA: NEGATIVE mg/dL
Ketones, ur: NEGATIVE mg/dL
Nitrite: NEGATIVE
Protein, ur: 30 mg/dL — AB
SPECIFIC GRAVITY, URINE: 1.018 (ref 1.005–1.030)
pH: 7.5 (ref 5.0–8.0)

## 2015-12-17 LAB — URINE MICROSCOPIC-ADD ON

## 2015-12-17 LAB — COMPREHENSIVE METABOLIC PANEL
ALBUMIN: 4.1 g/dL (ref 3.5–5.0)
ALK PHOS: 134 U/L — AB (ref 38–126)
ALT: 49 U/L (ref 14–54)
ANION GAP: 10 (ref 5–15)
AST: 102 U/L — ABNORMAL HIGH (ref 15–41)
BUN: 7 mg/dL (ref 6–20)
CALCIUM: 9.1 mg/dL (ref 8.9–10.3)
CO2: 23 mmol/L (ref 22–32)
Chloride: 105 mmol/L (ref 101–111)
Creatinine, Ser: 0.66 mg/dL (ref 0.44–1.00)
GFR calc Af Amer: >60 mL/min (ref >60)
GFR calc non Af Amer: >60 mL/min (ref >60)
GLUCOSE: 117 mg/dL — AB (ref 65–99)
POTASSIUM: 3.1 mmol/L — AB (ref 3.5–5.1)
SODIUM: 138 mmol/L (ref 135–145)
Total Bilirubin: 1.1 mg/dL (ref 0.3–1.2)
Total Protein: 7.6 g/dL (ref 6.5–8.1)

## 2015-12-17 LAB — CBC WITH DIFFERENTIAL/PLATELET
BASOS PCT: 1 %
Basophils Absolute: 0.1 10*3/uL (ref 0.0–0.1)
EOS PCT: 2 %
Eosinophils Absolute: 0.1 10*3/uL (ref 0.0–0.7)
HEMATOCRIT: 33 % — AB (ref 36.0–46.0)
HEMOGLOBIN: 11.1 g/dL — AB (ref 12.0–15.0)
LYMPHS PCT: 18 %
Lymphs Abs: 1.3 10*3/uL (ref 0.7–4.0)
MCH: 28.5 pg (ref 26.0–34.0)
MCHC: 33.6 g/dL (ref 30.0–36.0)
MCV: 84.8 fL (ref 78.0–100.0)
MONOS PCT: 8 %
Monocytes Absolute: 0.6 10*3/uL (ref 0.1–1.0)
NEUTROS ABS: 5.1 10*3/uL (ref 1.7–7.7)
Neutrophils Relative %: 71 %
Platelets: 167 10*3/uL (ref 150–400)
RBC: 3.89 MIL/uL (ref 3.87–5.11)
RDW: 14.1 % (ref 11.5–15.5)
WBC: 7.2 10*3/uL (ref 4.0–10.5)

## 2015-12-17 MED ORDER — ONDANSETRON HCL 4 MG/2ML IJ SOLN
4.0000 mg | Freq: Once | INTRAMUSCULAR | Status: AC
  Administered 2015-12-17: 4 mg via INTRAVENOUS
  Filled 2015-12-17: qty 2

## 2015-12-17 MED ORDER — SODIUM CHLORIDE 0.9 % IV SOLN
INTRAVENOUS | Status: DC
  Administered 2015-12-17: via INTRAVENOUS

## 2015-12-17 MED ORDER — MORPHINE SULFATE (PF) 4 MG/ML IV SOLN
4.0000 mg | Freq: Once | INTRAVENOUS | Status: AC
  Administered 2015-12-17: 4 mg via INTRAVENOUS
  Filled 2015-12-17: qty 1

## 2015-12-17 MED ORDER — METRONIDAZOLE IN NACL 5-0.79 MG/ML-% IV SOLN
500.0000 mg | Freq: Once | INTRAVENOUS | Status: AC
  Administered 2015-12-17: 500 mg via INTRAVENOUS
  Filled 2015-12-17: qty 100

## 2015-12-17 MED ORDER — IOHEXOL 300 MG/ML  SOLN
50.0000 mL | Freq: Once | INTRAMUSCULAR | Status: AC | PRN
  Administered 2015-12-17: 50 mL via ORAL

## 2015-12-17 MED ORDER — SODIUM CHLORIDE 0.9 % IV BOLUS (SEPSIS)
1000.0000 mL | Freq: Once | INTRAVENOUS | Status: AC
Start: 2015-12-17 — End: 2015-12-17
  Administered 2015-12-17: 1000 mL via INTRAVENOUS

## 2015-12-17 MED ORDER — CIPROFLOXACIN IN D5W 400 MG/200ML IV SOLN
400.0000 mg | Freq: Once | INTRAVENOUS | Status: AC
Start: 2015-12-17 — End: 2015-12-17
  Administered 2015-12-17: 400 mg via INTRAVENOUS
  Filled 2015-12-17: qty 200

## 2015-12-17 MED ORDER — SODIUM CHLORIDE 0.9 % IV BOLUS (SEPSIS)
1000.0000 mL | Freq: Once | INTRAVENOUS | Status: DC
Start: 2015-12-17 — End: 2015-12-18

## 2015-12-17 NOTE — Progress Notes (Signed)
Received report from Sam, RN from [employer] center. Awaiting patient to be transferred to [facility].

## 2015-12-17 NOTE — Progress Notes (Signed)
[facility] p[facility] regarding patient's arrival to [phone].

## 2015-12-17 NOTE — ED Notes (Signed)
Pt states has history of diverticulitis and states that's she noticed blood in stool a week or so ago BRB blood. Pt states lower left abd pain going into left lower back.

## 2015-12-17 NOTE — ED Notes (Signed)
Attempt x 1 right lateral AC unsuccessful. RN aware

## 2015-12-17 NOTE — ED Notes (Signed)
Attempts x2 for IV site without success.  RN and MD aware.

## 2015-12-17 NOTE — Progress Notes (Signed)
NURSING PROGRESS NOTE  Christina Vang  MRN: [id number]  Admission Data: 12/18/2015 11:29 PM Attending Provider: No att. providers found  PCP: Roxanne Mins, PA-C  Code status: FULL  Allergies:  Allergies  Allergen Reactions  . Augmentin [Amoxicillin-Pot Clavulanate] Nausea And Vomiting  . Codeine Nausea And Vomiting  . Tetracyclines & Related Nausea And Vomiting     Past Medical History:  has a past medical history of Fibromyalgia; Mitral valve prolapse; Hypertension; Hyperparathyroidism (HCC); GERD (gastroesophageal reflux disease); Depression; High cholesterol; Atrial fibrillation (HCC); Bronchial asthma; Pneumonia (X 2); Anemia; H/O hiatal hernia; Daily headache; Arthritis; Osteopenia; Chronic back pain; Anxiety; Acute renal disease; Chronic kidney disease (CKD), stage II (mild); and Diverticulitis.   Past Surgical History:  has past surgical history that includes Nissen fundoplication (1980's); Knee arthroscopy (Left, 1990's); Cholecystectomy (~ 2008); Liver biopsy (1990's); Tonsillectomy and adenoidectomy (~ 1960); Hernia repair; Vaginal hysterectomy (1980's); Tubal ligation (1972); Cardiac catheterization; and Transurethral resection of bladder tumor with gyrus (turbt-gyrus) (~ 12/2013).   Christina Vang is a 65 y.o.  female patient, arrived to floor in room [id number] via EMS, transferred from [facility]. Patient alert and oriented X 4. No acute distress noted. Complains of pain 7/10 over left lower abdomen, describing as soreness and constant.   Vital signs: Oral temperature 99.2 F (37.3 C), Blood pressure 136/83, Pulse 16, RR 16, SpO2 98 % on room air. Height 5'7" (170.2 cm), weight 197 lbs (89.4 kg).   Cardiac monitoring: None  IV access: Right hand; condition patent and no redness.  Skin: intact, no pressure ulcer noted in sacral area.   Patient's ID armband verified with patient/ family, and in place. Information packet given to patient/ family. Fall risk  assessed, SR up X2, patient/ family able to verbalize understanding of risks associated with falls and to call nurse or staff to assist before getting out of bed. Patient/ family oriented to room and equipment. Call bell within reach.

## 2015-12-17 NOTE — ED Provider Notes (Signed)
CSN: [id number]     Arrival date & time 12/17/15  1623 History  By signing my name below, I, Christina Vang, attest that this documentation has been prepared under the direction and in the presence of Geoffery Lyons, MD. Electronically Signed: [name], ED Scribe. 12/17/2015. 5:31 PM.   Chief Complaint  Patient presents with  . Flank Pain   HPI Comments: Patient with history of diverticulitis. She presents with left flank and left lower quadrant pain. Reports fever at home along with bloody, mucousy stool.  Patient is a 65 y.o. female presenting with flank pain. The history is provided by the patient. No language interpreter was used.  Flank Pain This is a new problem. The current episode started more than 2 days ago. The problem occurs rarely. The problem has been gradually worsening. Associated symptoms include abdominal pain. Pertinent negatives include no chest pain, no headaches and no shortness of breath.     HPI Comments: [name] is a 65 y.o. female with hx diverticulitis who presents to the Emergency Department complaining of gradual onset, intermittent, left flank pain radiating to LLQ x 1 week, worsening 1 day ago. The pain is mildly alleviated after having a bowel movement. Pt also complains of hematochezia, mucous in her stools, and a fever with Tmax 101.7. Pt has hx of diverticulitis but states these symptoms feel different. Denies dysuria, hematuria, or any other associated symptoms. PSHx cholecystectomy, nissen fundoplication, and hiatal hernia repair.   Past Medical History  Diagnosis Date  . Fibromyalgia   . Mitral valve prolapse   . Hypertension   . Hyperparathyroidism (HCC)   . GERD (gastroesophageal reflux disease)   . Depression   . High cholesterol   . Atrial fibrillation (HCC)   . Bronchial asthma   . Pneumonia X 2  . Anemia   . H/O hiatal hernia   . Daily headache     "recently" (07/31/2014)  . Arthritis   . Osteopenia   . Chronic back pain    . Anxiety   . Acute renal disease   . Chronic kidney disease (CKD), stage II (mild)   . Diverticulitis    Past Surgical History  Procedure Laterality Date  . Nissen fundoplication  1980's  . Knee arthroscopy Left 1990's  . Cholecystectomy  ~ 2008  . Liver biopsy  1990's    "fatty liver"  . Tonsillectomy and adenoidectomy  ~ 1960  . Hernia repair    . Vaginal hysterectomy  1980's  . Tubal ligation  1972  . Cardiac catheterization      "several"  . Transurethral resection of bladder tumor with gyrus (turbt-gyrus)  ~ 12/2013   Family History  Problem Relation Age of Onset  . Hypertension Mother   . Hypertension Father   . Stroke Mother   . Stroke Father   . Diabetes Mother   . Diabetes Sister   . Diabetes Brother    Social History  Substance Use Topics  . Smoking status: Never Smoker   . Smokeless tobacco: Never Used  . Alcohol Use: No   OB History    No data available     Review of Systems  Respiratory: Negative for shortness of breath.   Cardiovascular: Negative for chest pain.  Gastrointestinal: Positive for abdominal pain.  Genitourinary: Positive for flank pain.  Neurological: Negative for headaches.    A complete 10 system review of systems was obtained and all systems are negative except as noted in the HPI and PMH.  Allergies  Augmentin; Codeine; and Tetracyclines & related  Home Medications   Prior to Admission medications   Medication Sig Start Date End Date Taking? Authorizing Provider  amLODipine (NORVASC) 10 MG tablet Take 1 tablet (10 mg total) by mouth daily. Patient taking differently: Take 5 mg by mouth daily.  08/08/14  Yes Kathlen Mody, MD  aspirin EC 81 MG tablet Take 81 mg by mouth daily.   Yes Historical Provider, MD  atorvastatin (LIPITOR) 20 MG tablet Take 20 mg by mouth at bedtime.    Yes Historical Provider, MD  cetirizine (ZYRTEC) 10 MG tablet Take 10 mg by mouth daily.   Yes Historical Provider, MD  diclofenac sodium (VOLTAREN) 1  % GEL Apply 1 application topically 2 (two) times daily as needed (pain).   Yes Historical Provider, MD  eplerenone (INSPRA) 25 MG tablet Take 25 mg by mouth daily.   Yes Historical Provider, MD  eszopiclone (LUNESTA) 2 MG TABS tablet Take 2 mg by mouth at bedtime as needed for sleep. Take immediately before bedtime   Yes Historical Provider, MD  labetalol (NORMODYNE) 100 MG tablet Take 100 mg by mouth 2 (two) times daily.   Yes Historical Provider, MD  albuterol (PROVENTIL HFA;VENTOLIN HFA) 108 (90 BASE) MCG/ACT inhaler Inhale 2 puffs into the lungs 2 (two) times daily as needed for wheezing or shortness of breath.    Historical Provider, MD  docusate sodium 100 MG CAPS Take 200 mg by mouth 2 (two) times daily. 08/08/14   Kathlen Mody, MD  DULoxetine (CYMBALTA) 60 MG capsule Take 60 mg by mouth daily.    Historical Provider, MD  HYDROcodone-acetaminophen (NORCO) 5-325 MG tablet Take 1-2 tablets by mouth every 6 (six) hours as needed. 10/02/15   Loren Racer, MD  Lurasidone HCl 20 MG TABS Take by mouth.    Historical Provider, MD  olmesartan (BENICAR) 20 MG tablet Take 20 mg by mouth daily.    Historical Provider, MD  omega-3 acid ethyl esters (LOVAZA) 1 G capsule Take 1 g by mouth 2 (two) times daily.    Historical Provider, MD  omeprazole (PRILOSEC) 40 MG capsule Take 40 mg by mouth daily.    Historical Provider, MD  Omeprazole-Sodium Bicarbonate (ZEGERID) 20-1100 MG CAPS capsule Take 1 capsule by mouth daily before breakfast.    Historical Provider, MD  ondansetron (ZOFRAN) 4 MG tablet Take 1 tablet (4 mg total) by mouth every 8 (eight) hours as needed for nausea or vomiting. 10/02/15   Loren Racer, MD  potassium chloride (K-DUR,KLOR-CON) 10 MEQ tablet Take 10 mEq by mouth daily.    Historical Provider, MD  ramelteon (ROZEREM) 8 MG tablet Take 1 tablet (8 mg total) by mouth at bedtime. 08/08/14   Kathlen Mody, MD  traMADol (ULTRAM) 50 MG tablet Take 1 tablet (50 mg total) by mouth every 6  (six) hours as needed for severe pain. 08/08/14   Kathlen Mody, MD  traZODone (DESYREL) 100 MG tablet Take 1 tablet (100 mg total) by mouth at bedtime. 08/08/14   Kathlen Mody, MD   BP 172/94 mmHg  Pulse 102  Temp(Src) 99.5 F (37.5 C) (Oral)  Resp 16  SpO2 100%   Physical Exam  Constitutional: She is oriented to person, place, and time. She appears well-developed and well-nourished. No distress.  HENT:  Head: Normocephalic and atraumatic.  Mouth/Throat: Oropharynx is clear and moist. No oropharyngeal exudate.  Eyes: Conjunctivae and EOM are normal. Pupils are equal, round, and reactive to light.  Neck: Normal  range of motion. Neck supple.  No meningismus.  Cardiovascular: Normal rate, regular rhythm, normal heart sounds and intact distal pulses.   No murmur heard. Pulmonary/Chest: Effort normal and breath sounds normal. No respiratory distress.  Abdominal: Soft. There is tenderness. There is no rebound and no guarding.  TTP LLQ  Musculoskeletal: Normal range of motion. She exhibits no edema or tenderness.  Neurological: She is alert and oriented to person, place, and time. No cranial nerve deficit. She exhibits normal muscle tone. Coordination normal.  No ataxia on finger to nose bilaterally. No pronator drift. 5/5 strength throughout. CN 2-12 intact.Equal grip strength. Sensation intact.   Skin: Skin is warm.  Psychiatric: She has a normal mood and affect. Her behavior is normal.  Nursing note and vitals reviewed.   ED Course  Procedures (including critical care time)  DIAGNOSTIC STUDIES: Oxygen Saturation is 100% on RA, normal by my interpretation.    COORDINATION OF CARE: 5:20 PM-Discussed treatment plan which includes CT A/P, CMP, CBC, and UA with pt at bedside and pt agreed to plan.   Labs Review Labs Reviewed  COMPREHENSIVE METABOLIC PANEL  CBC WITH DIFFERENTIAL/PLATELET  URINALYSIS, ROUTINE W REFLEX MICROSCOPIC (NOT AT [facility])    Imaging Review No results found. I  have personally reviewed and evaluated these images and lab results as part of my medical decision-making.    MDM   Final diagnoses:  None    Workup reveals no leukocytosis, however the patient is febrile and CT scan reveals acute diverticulitis with distal colitis. She will be given IV Cipro, IV Flagyl, and pain medication. She is not feeling any better, and will be admitted to the hospitalist service under the care of Dr. Antionette Char.   I personally performed the services described in this documentation, which was scribed in my presence. The recorded information has been reviewed and is accurate.       Geoffery Lyons, MD 12/17/15 2337

## 2015-12-18 ENCOUNTER — Encounter (HOSPITAL_COMMUNITY): Payer: Self-pay

## 2015-12-18 DIAGNOSIS — D649 Anemia, unspecified: Secondary | ICD-10-CM | POA: Diagnosis present

## 2015-12-18 DIAGNOSIS — A419 Sepsis, unspecified organism: Secondary | ICD-10-CM | POA: Diagnosis present

## 2015-12-18 LAB — PROCALCITONIN

## 2015-12-18 LAB — COMPREHENSIVE METABOLIC PANEL
ALBUMIN: 3.9 g/dL (ref 3.5–5.0)
ALK PHOS: 206 U/L — AB (ref 38–126)
ALT: 251 U/L — AB (ref 14–54)
ANION GAP: 12 (ref 5–15)
AST: 531 U/L — ABNORMAL HIGH (ref 15–41)
BILIRUBIN TOTAL: 1.6 mg/dL — AB (ref 0.3–1.2)
BUN: <5 mg/dL — ABNORMAL LOW (ref 6–20)
CALCIUM: 9.4 mg/dL (ref 8.9–10.3)
CO2: 26 mmol/L (ref 22–32)
CREATININE: 0.8 mg/dL (ref 0.44–1.00)
Chloride: 101 mmol/L (ref 101–111)
GFR calc Af Amer: >60 mL/min (ref >60)
GFR calc non Af Amer: >60 mL/min (ref >60)
GLUCOSE: 116 mg/dL — AB (ref 65–99)
Potassium: 3 mmol/L — ABNORMAL LOW (ref 3.5–5.1)
Sodium: 139 mmol/L (ref 135–145)
TOTAL PROTEIN: 7.5 g/dL (ref 6.5–8.1)

## 2015-12-18 LAB — CBC WITH DIFFERENTIAL/PLATELET
BASOS ABS: 0 10*3/uL (ref 0.0–0.1)
BASOS PCT: 0 %
EOS ABS: 0.1 10*3/uL (ref 0.0–0.7)
EOS PCT: 1 %
HEMATOCRIT: 32.7 % — AB (ref 36.0–46.0)
Hemoglobin: 10.7 g/dL — ABNORMAL LOW (ref 12.0–15.0)
Lymphocytes Relative: 20 %
Lymphs Abs: 1.3 10*3/uL (ref 0.7–4.0)
MCH: 28.2 pg (ref 26.0–34.0)
MCHC: 32.7 g/dL (ref 30.0–36.0)
MCV: 86.3 fL (ref 78.0–100.0)
MONO ABS: 0.4 10*3/uL (ref 0.1–1.0)
MONOS PCT: 7 %
NEUTROS ABS: 4.6 10*3/uL (ref 1.7–7.7)
Neutrophils Relative %: 72 %
Platelets: 237 10*3/uL (ref 150–400)
RBC: 3.79 MIL/uL — ABNORMAL LOW (ref 3.87–5.11)
RDW: 14.6 % (ref 11.5–15.5)
WBC: 6.4 10*3/uL (ref 4.0–10.5)

## 2015-12-18 LAB — ETHANOL: Alcohol, Ethyl (B): <5 mg/dL (ref <5)

## 2015-12-18 LAB — APTT: aPTT: 33 seconds (ref 24–37)

## 2015-12-18 LAB — GLUCOSE, CAPILLARY: GLUCOSE-CAPILLARY: 119 mg/dL — AB (ref 65–99)

## 2015-12-18 LAB — LACTIC ACID, PLASMA
LACTIC ACID, VENOUS: 0.8 mmol/L (ref 0.5–2.0)
Lactic Acid, Venous: 1.1 mmol/L (ref 0.5–2.0)

## 2015-12-18 LAB — PROTIME-INR
INR: 1.03 (ref 0.00–1.49)
PROTHROMBIN TIME: 13.7 s (ref 11.6–15.2)

## 2015-12-18 LAB — ACETAMINOPHEN LEVEL

## 2015-12-18 MED ORDER — LORATADINE 10 MG PO TABS
10.0000 mg | ORAL_TABLET | Freq: Every day | ORAL | Status: DC
  Administered 2015-12-18 – 2015-12-21 (×4): 10 mg via ORAL
  Filled 2015-12-18 (×4): qty 1

## 2015-12-18 MED ORDER — POTASSIUM CHLORIDE IN NACL 40-0.9 MEQ/L-% IV SOLN
INTRAVENOUS | Status: AC
Start: 2015-12-18 — End: 2015-12-18
  Administered 2015-12-18: 100 mL/h via INTRAVENOUS
  Filled 2015-12-18 (×2): qty 1000

## 2015-12-18 MED ORDER — LABETALOL HCL 200 MG PO TABS
200.0000 mg | ORAL_TABLET | Freq: Two times a day (BID) | ORAL | Status: DC
  Administered 2015-12-18 – 2015-12-21 (×6): 200 mg via ORAL
  Filled 2015-12-18 (×6): qty 1

## 2015-12-18 MED ORDER — SPIRONOLACTONE 25 MG PO TABS
25.0000 mg | ORAL_TABLET | Freq: Every day | ORAL | Status: DC
  Administered 2015-12-18 – 2015-12-21 (×4): 25 mg via ORAL
  Filled 2015-12-18 (×4): qty 1

## 2015-12-18 MED ORDER — MORPHINE SULFATE (PF) 2 MG/ML IV SOLN
2.0000 mg | INTRAVENOUS | Status: DC | PRN
  Administered 2015-12-18: 2 mg via INTRAVENOUS
  Filled 2015-12-18: qty 1

## 2015-12-18 MED ORDER — LABETALOL HCL 100 MG PO TABS
100.0000 mg | ORAL_TABLET | Freq: Two times a day (BID) | ORAL | Status: DC
  Administered 2015-12-18 (×2): 100 mg via ORAL
  Filled 2015-12-18 (×2): qty 1

## 2015-12-18 MED ORDER — CIPROFLOXACIN IN D5W 400 MG/200ML IV SOLN
400.0000 mg | Freq: Two times a day (BID) | INTRAVENOUS | Status: DC
  Administered 2015-12-18 – 2015-12-21 (×7): 400 mg via INTRAVENOUS
  Filled 2015-12-18 (×7): qty 200

## 2015-12-18 MED ORDER — IRBESARTAN 75 MG PO TABS
37.5000 mg | ORAL_TABLET | Freq: Every day | ORAL | Status: DC
  Filled 2015-12-18: qty 0.5

## 2015-12-18 MED ORDER — ONDANSETRON HCL 4 MG PO TABS
4.0000 mg | ORAL_TABLET | Freq: Four times a day (QID) | ORAL | Status: DC | PRN
  Administered 2015-12-18 – 2015-12-19 (×2): 4 mg via ORAL
  Filled 2015-12-18 (×2): qty 1

## 2015-12-18 MED ORDER — PANTOPRAZOLE SODIUM 40 MG PO TBEC
40.0000 mg | DELAYED_RELEASE_TABLET | Freq: Every day | ORAL | Status: DC
  Administered 2015-12-18 – 2015-12-21 (×4): 40 mg via ORAL
  Filled 2015-12-18 (×4): qty 1

## 2015-12-18 MED ORDER — METRONIDAZOLE IN NACL 5-0.79 MG/ML-% IV SOLN
500.0000 mg | Freq: Three times a day (TID) | INTRAVENOUS | Status: DC
  Administered 2015-12-18 – 2015-12-21 (×10): 500 mg via INTRAVENOUS
  Filled 2015-12-18 (×10): qty 100

## 2015-12-18 MED ORDER — LURASIDONE HCL 20 MG PO TABS
20.0000 mg | ORAL_TABLET | Freq: Every day | ORAL | Status: DC
  Filled 2015-12-18: qty 1

## 2015-12-18 MED ORDER — ZOLPIDEM TARTRATE 5 MG PO TABS
5.0000 mg | ORAL_TABLET | Freq: Every evening | ORAL | Status: DC | PRN
  Administered 2015-12-19 – 2015-12-20 (×2): 5 mg via ORAL
  Filled 2015-12-18 (×2): qty 1

## 2015-12-18 MED ORDER — DULOXETINE HCL 60 MG PO CPEP
60.0000 mg | ORAL_CAPSULE | Freq: Every day | ORAL | Status: DC
  Administered 2015-12-19 – 2015-12-21 (×3): 60 mg via ORAL
  Filled 2015-12-18 (×4): qty 1

## 2015-12-18 MED ORDER — TRAZODONE HCL 100 MG PO TABS
100.0000 mg | ORAL_TABLET | Freq: Every day | ORAL | Status: DC
  Administered 2015-12-18: 100 mg via ORAL
  Filled 2015-12-18: qty 1

## 2015-12-18 MED ORDER — ONDANSETRON HCL 4 MG/2ML IJ SOLN
4.0000 mg | Freq: Four times a day (QID) | INTRAMUSCULAR | Status: DC | PRN
  Administered 2015-12-18 – 2015-12-21 (×4): 4 mg via INTRAVENOUS
  Filled 2015-12-18 (×4): qty 2

## 2015-12-18 MED ORDER — OMEGA-3-ACID ETHYL ESTERS 1 G PO CAPS
1.0000 g | ORAL_CAPSULE | Freq: Two times a day (BID) | ORAL | Status: DC
  Administered 2015-12-18 – 2015-12-21 (×8): 1 g via ORAL
  Filled 2015-12-18 (×8): qty 1

## 2015-12-18 MED ORDER — RAMELTEON 8 MG PO TABS
8.0000 mg | ORAL_TABLET | Freq: Every day | ORAL | Status: DC
  Administered 2015-12-18: 8 mg via ORAL
  Filled 2015-12-18 (×2): qty 1

## 2015-12-18 MED ORDER — AMLODIPINE BESYLATE 5 MG PO TABS
5.0000 mg | ORAL_TABLET | Freq: Every day | ORAL | Status: DC
  Administered 2015-12-18 – 2015-12-21 (×4): 5 mg via ORAL
  Filled 2015-12-18 (×4): qty 1

## 2015-12-18 MED ORDER — ALBUTEROL SULFATE (2.5 MG/3ML) 0.083% IN NEBU: 3.0000 mL | INHALATION_SOLUTION | Freq: Two times a day (BID) | RESPIRATORY_TRACT | Status: DC | PRN

## 2015-12-18 MED ORDER — ATORVASTATIN CALCIUM 20 MG PO TABS
20.0000 mg | ORAL_TABLET | Freq: Every day | ORAL | Status: DC
Start: 2015-12-18 — End: 2015-12-18
  Administered 2015-12-18: 20 mg via ORAL
  Filled 2015-12-18: qty 1

## 2015-12-18 MED ORDER — HYDROCODONE-ACETAMINOPHEN 5-325 MG PO TABS
1.0000 | ORAL_TABLET | Freq: Four times a day (QID) | ORAL | Status: DC | PRN
Start: 2015-12-18 — End: 2015-12-21
  Administered 2015-12-18: 2 via ORAL
  Administered 2015-12-18: 1 via ORAL
  Administered 2015-12-18 – 2015-12-20 (×7): 2 via ORAL
  Administered 2015-12-21 (×2): 1 via ORAL
  Filled 2015-12-18 (×4): qty 2
  Filled 2015-12-18: qty 1
  Filled 2015-12-18 (×2): qty 2
  Filled 2015-12-18: qty 1
  Filled 2015-12-18 (×3): qty 2

## 2015-12-18 MED ORDER — ENOXAPARIN SODIUM 40 MG/0.4ML ~~LOC~~ SOLN
40.0000 mg | SUBCUTANEOUS | Status: DC
  Administered 2015-12-18 – 2015-12-20 (×3): 40 mg via SUBCUTANEOUS
  Filled 2015-12-18 (×3): qty 0.4

## 2015-12-18 NOTE — Progress Notes (Signed)
[facility] PROGRESS NOTE  Christina Vang XHB:[id number] DOB: 12/08/1950 DOA: 12/17/2015 PCP: Isaias Cowman, PA-C  Brief Summary  The patient is a 65 year old female with history of hypertension, depression, paroxysmal atrial fibrillation who presented to [facility] with abdominal pain and fevers. She has had recurrent diverticulitis, last episode was 2 months ago. She presented with worsening left upper quadrant and left lower quadrant abdominal pain that was constant, severe, and worsened with movement.  She had some bloody mucousy stools. In the emergency department, she was febrile and tachycardic and CT scan of the abdomen and pelvis demonstrated acute diverticulitis at the proximal sigmoid colon without abscess or perforation however she had inflammatory changes of the distal sigmoid colon that suggested superimposed lightness. She was given IV fluids and started on antibiotics and admitted.  Assessment/Plan  Sepsis secondary to diverticulitis with colitis, with bloody mucousy stools.  lactic acid wnl.  [facility]ally followed at [facility] -  Blood cultures pending -  Advance diet slowly -  Continue ciprofloxacin and Flagyl -  Once tolerating POs, recommend antibiotics and follow up with her GI doctor at [facility] for possible repeat colonoscopy  Transaminitis in a 2:1 ratio, may be due to sepsis, minimal elevation of alk phos and bili -  EtOH level -  Tylenol level -  Acute hepatitis panel -  IVF and repeat in AM -  Minimize nephrotoxins -  Hold statin  Hypokalemia, unable to tolerate much by mouth -  Agree with potassium and IV fluids  Essential hypertension - Continue home-dose Norvasc, labetalol, ARB with formulary equivalent  Paroxysmal atrial fibrillation, not on A/C -  CHADs2vasc = 2 (HTN and female)  -  HASBLED = 2 (with recent rectal bleeding)  Normocytic anemia, stable from prior - Check iron studies, B12, folate  Depression with h/o suicide attempt -  Appropriate mood and affect on exam, denies SI, HI, or hallucination  - Stable per pt  - Will continue Latuda, Cymbalta  Diet:  FLD Access:  PIV IVF:  yes Proph:  lovenox (carefully monitoring for rectal bleeding)  Code Status: Full Family Communication: patient alone Disposition Plan: pending improvement in abdominal pain, tolerating diet   Consultants:  none  Procedures:  CT ab/p  Antibiotics:  Cipro/flagyl 1/30   HPI/Subjective:  Ongoing pain in the LUQ and LLQ worse with changing positions or getting up to go to the bathroom, which is worse than previous episodes of diverticulitis.  Fevers and chills are improving.  Had one greenish watery stool since admission, no blood or mucous.      Objective: Filed Vitals:   12/17/15 2327 12/17/15 2330 12/18/15 0611 12/18/15 0835  BP: 136/83  105/58 131/69  Pulse: 89  76 76  Temp: 99.2 F (37.3 C)  99.6 F (37.6 C) 98.6 F (37 C)  TempSrc: Oral  Oral Oral  Resp: 16  16   Height:  '5\' 7"'$  (1.702 m)    Weight:  89.4 kg (197 lb 1.5 oz)    SpO2: 98%  98%     Intake/Output Summary (Last 24 hours) at 12/18/15 1209 Last data filed at 12/18/15 0900  Gross per 24 hour  Intake 1246.67 ml  Output      0 ml  Net 1246.67 ml   Filed Weights   12/17/15 2330  Weight: 89.4 kg (197 lb 1.5 oz)   Body mass index is 30.86 kg/(m^2).  Exam:   General:  Adult female, No acute distress  HEENT:  NCAT, MMM  Cardiovascular:  RRR, nl S1, S2 no mrg, 2+ pulses, warm extremities  Respiratory:  CTAB, no increased WOB  Abdomen:   Hyperactive BS, soft, ND, TTP in the LUQ, LLQ with some referred pain to the umbilicus and no guarding.  Grimaces   MSK:   Normal tone and bulk, no LEE  Neuro:  Grossly intact  Data Reviewed: Basic Metabolic Panel:  Recent Labs Lab 12/17/15 2015 12/18/15 0143  NA 138 139  K 3.1* 3.0*  CL 105 101  CO2 23 26  GLUCOSE 117* 116*  BUN 7 <5*  CREATININE 0.66 0.80  CALCIUM 9.1 9.4   Liver Function  Tests:  Recent Labs Lab 12/17/15 2015 12/18/15 0143  AST 102* 531*  ALT 49 251*  ALKPHOS 134* 206*  BILITOT 1.1 1.6*  PROT 7.6 7.5  ALBUMIN 4.1 3.9   No results for input(s): LIPASE, AMYLASE in the last 168 hours. No results for input(s): AMMONIA in the last 168 hours. CBC:  Recent Labs Lab 12/17/15 2015 12/18/15 0143  WBC 7.2 6.4  NEUTROABS 5.1 4.6  HGB 11.1* 10.7*  HCT 33.0* 32.7*  MCV 84.8 86.3  PLT 167 237    No results found for this or any previous visit (from the past 240 hour(s)).   Studies: Ct Abdomen Pelvis Wo Contrast  12/17/2015  CLINICAL DATA:  Left lower quadrant pain. EXAM: CT ABDOMEN AND PELVIS WITHOUT CONTRAST TECHNIQUE: Multidetector CT imaging of the abdomen and pelvis was performed following the standard protocol without IV contrast. COMPARISON:  CT scan dated 10/02/2015 FINDINGS: Lower chest: Stable 3 mm nodule in the left lung base on image 7 of series 3. Evidence of prior Nissen fundoplication. A portion of the plication extends through the diaphragmatic hiatus, unchanged. Heart size is normal. Hepatobiliary: Stable benign appearing 2.9 cm cyst in the lateral aspect of the low left lobe of the liver. Prior cholecystectomy. No bile duct dilatation. Pancreas: Normal. Spleen: Normal. Adrenals/Urinary Tract: Normal. Stomach/Bowel: There is focal acute diverticulitis of the junction of the descending colon with the sigmoid. There is also mucosal thickening in the sigmoid colon which suggests either more extensive diverticulitis or superimposed focal colitis. Vascular/Lymphatic: Scattered aortic atherosclerosis. No adenopathy. Reproductive: Uterus and ovaries have been removed. Other: Tiny amount of free fluid in the right side of the pelvis. No free air. Musculoskeletal: No acute abnormality. Chronic grade 1 spondylolisthesis and degenerative disc disease at L5-S1. IMPRESSION: 1. Acute diverticulitis at the junction of the descending colon with the sigmoid. This is  a different area than on the prior exam. 2. There is more extensive mucosal thickening distal to the focal area of inflammation which could represent adherent stool in the sigmoid or superimposed focal colitis. Electronically Signed   By: Lorriane Shire M.D.   On: 12/17/2015 20:17    Scheduled Meds: . amLODipine  5 mg Oral Daily  . atorvastatin  20 mg Oral QHS  . ciprofloxacin  400 mg Intravenous Q12H  . DULoxetine  60 mg Oral Daily  . enoxaparin (LOVENOX) injection  40 mg Subcutaneous Q24H  . irbesartan  37.5 mg Oral Daily  . labetalol  100 mg Oral BID  . loratadine  10 mg Oral Daily  . lurasidone  20 mg Oral Daily  . metronidazole  500 mg Intravenous Q8H  . omega-3 acid ethyl esters  1 g Oral BID  . pantoprazole  40 mg Oral Daily  . ramelteon  8 mg Oral QHS  . spironolactone  25 mg Oral Daily  .  traZODone  100 mg Oral QHS   Continuous Infusions: . 0.9 % NaCl with KCl 40 mEq / L 100 mL/hr (12/18/15 0201)    Principal Problem:   Acute diverticulitis Active Problems:   Hypokalemia   HTN (hypertension)   Sepsis (HCC)   Normocytic anemia    Time spent: 30 min    Enez Monahan, [facility] Pager [phone]. If 7PM-7AM, please contact night-coverage at [email or web address], password [facility] 12/18/2015, 12:09 PM  LOS: 0 days

## 2015-12-18 NOTE — Care Management Note (Signed)
Case Management Note  Patient Details  Name: DAMESHA LAWLER MRN: [id number] Date of Birth: [birth year]  Subjective/Objective:                 Admitted with Sepsis secondary to diverticulitis with colitis. Independent with ADL's. No DME usage.   Action/Plan: Return to home when medically stable.CM to f/u with disposition needs.  Expected Discharge Date:                  Expected Discharge Plan:  Home/Self Care  In-House Referral:     Discharge planning Services  CM Consult  Post Acute Care Choice:    Choice offered to:     DME Arranged:    DME Agency:     HH Arranged:    HH Agency:     Status of Service:  In process, will continue to follow  Medicare Important Message Given:    Date Medicare IM Given:    Medicare IM give by:    Date Additional Medicare IM Given:    Additional Medicare Important Message give by:     If discussed at Long Length of Stay Meetings, dates discussed:    Additional Comments: Larita Fife [facility]) [phone], [facility] [place](daughter) [phone]  Epifanio Lesches, RN 12/18/2015, 7:19 PM

## 2015-12-18 NOTE — H&P (Signed)
[facility] History and Physical  Christina Vang:[id number] DOB: Jan 03, 1951 DOA: 12/17/2015  Referring physician: ED physician PCP: Roxanne Mins, PA-C  Specialists:   Chief Complaint:  Fever, abdominal pain  HPI: Christina Vang is a 65 y.o. female with PMH of hypertension, depression, and paroxysmal atrial fibrillation who presents in transfer from [facility] where she presented with abdominal pain and fevers. Patient reports an episode of acute diverticulitis in November 2016 which was managed at [facility]. She had been in her usual state of health since recovering from that episode until there was recurrence of abdominal pain approximately 2 weeks ago. Pain was mild and intermittent at that time but has since worsened and become severe. Over the last 2 days, the patient notes subjective fevers and chills, as well as acutely worsening lower abdominal pain. Pain is described as constant, severe, achy, localized to the lower abdomen, and alleviated somewhat with defecation. Patient notes that stools have contained scant blood and mucus last 2 days. She denies any nausea, vomiting, chest pains, palpitations, or headaches. She was scheduled for colonoscopy at [facility] last month but lost her insurance coverage and canceled the appointment.  In ED, patient was found to be febrile to 38.3 C, tachycardic in the low 100s, saturating well on room air, and with stable blood pressure. Initial blood work returned with hypokalemia and a mild normocytic anemia. CT of the abdomen and pelvis was obtained and is consistent with acute diverticulitis at the proximal sigmoid colon. There is no mention of abscess or perforation, but notation is made of inflammatory change to the distal sigmoid which may represent a superimposed colitis versus more diffuse diverticulitis. Patient was bolused with 2 L of normal saline in the emergency department, given empiric doses of  Cipro and Flagyl, remained stable, and was transported to [facility] for admission.  Where does patient live?   At home    Can patient participate in ADLs?  Yes      Review of Systems:   General:  Fever, chills. No  sweats, weight change, poor appetite, or fatigue HEENT: no blurry vision, hearing changes or sore throat Pulm: no dyspnea, cough, or wheeze CV: no chest pain or palpitations Abd: no nausea or vomiting.  Lower abd pain, chronic constipation, recent stools loose with scant mucus and blood.  GU: no dysuria, hematuria, increased urinary frequency, or urgency  Ext: no leg edema Neuro: no focal weakness, numbness, or tingling, no vision change or hearing loss Skin: no rash, no wounds MSK: No muscle spasm, no deformity, no red, hot, or swollen joint Heme: No easy bruising or bleeding Travel history: No recent long distant travel    Allergy:  Allergies  Allergen Reactions  . Augmentin [Amoxicillin-Pot Clavulanate] Nausea And Vomiting  . Codeine Nausea And Vomiting  . Tetracyclines & Related Nausea And Vomiting    Past Medical History  Diagnosis Date  . Fibromyalgia   . Mitral valve prolapse   . Hypertension   . Hyperparathyroidism (HCC)   . GERD (gastroesophageal reflux disease)   . Depression   . High cholesterol   . Atrial fibrillation (HCC)   . Bronchial asthma   . Pneumonia X 2  . Anemia   . H/O hiatal hernia   . Daily headache     "recently" (07/31/2014)  . Arthritis   . Osteopenia   . Chronic back pain   . Anxiety   . Acute renal disease   .  Chronic kidney disease (CKD), stage II (mild)   . Diverticulitis     Past Surgical History  Procedure Laterality Date  . Nissen fundoplication  1980's  . Knee arthroscopy Left 1990's  . Cholecystectomy  ~ 2008  . Liver biopsy  1990's    "fatty liver"  . Tonsillectomy and adenoidectomy  ~ 1960  . Hernia repair    . Vaginal hysterectomy  1980's  . Tubal ligation  1972  . Cardiac catheterization       "several"  . Transurethral resection of bladder tumor with gyrus (turbt-gyrus)  ~ 12/2013    Social History:  reports that she has never smoked. She has never used smokeless tobacco. She reports that she does not drink alcohol or use illicit drugs.  Family History:  Family History  Problem Relation Age of Onset  . Hypertension Mother   . Hypertension Father   . Stroke Mother   . Stroke Father   . Diabetes Mother   . Diabetes Sister   . Diabetes Brother      Prior to Admission medications   Medication Sig Start Date End Date Taking? Authorizing Provider  amLODipine (NORVASC) 10 MG tablet Take 1 tablet (10 mg total) by mouth daily. Patient taking differently: Take 5 mg by mouth daily.  08/08/14  Yes Kathlen Mody, MD  aspirin EC 81 MG tablet Take 81 mg by mouth daily.   Yes Historical Provider, MD  atorvastatin (LIPITOR) 20 MG tablet Take 20 mg by mouth at bedtime.    Yes Historical Provider, MD  cetirizine (ZYRTEC) 10 MG tablet Take 10 mg by mouth daily.   Yes Historical Provider, MD  diclofenac sodium (VOLTAREN) 1 % GEL Apply 1 application topically 2 (two) times daily as needed (pain).   Yes Historical Provider, MD  eplerenone (INSPRA) 25 MG tablet Take 25 mg by mouth daily.   Yes Historical Provider, MD  eszopiclone (LUNESTA) 2 MG TABS tablet Take 2 mg by mouth at bedtime as needed for sleep. Take immediately before bedtime   Yes Historical Provider, MD  labetalol (NORMODYNE) 100 MG tablet Take 100 mg by mouth 2 (two) times daily.   Yes Historical Provider, MD  albuterol (PROVENTIL HFA;VENTOLIN HFA) 108 (90 BASE) MCG/ACT inhaler Inhale 2 puffs into the lungs 2 (two) times daily as needed for wheezing or shortness of breath.    Historical Provider, MD  docusate sodium 100 MG CAPS Take 200 mg by mouth 2 (two) times daily. 08/08/14   Kathlen Mody, MD  DULoxetine (CYMBALTA) 60 MG capsule Take 60 mg by mouth daily.    Historical Provider, MD  HYDROcodone-acetaminophen (NORCO) 5-325 MG tablet  Take 1-2 tablets by mouth every 6 (six) hours as needed. 10/02/15   Loren Racer, MD  Lurasidone HCl 20 MG TABS Take by mouth.    Historical Provider, MD  olmesartan (BENICAR) 20 MG tablet Take 20 mg by mouth daily.    Historical Provider, MD  omega-3 acid ethyl esters (LOVAZA) 1 G capsule Take 1 g by mouth 2 (two) times daily.    Historical Provider, MD  omeprazole (PRILOSEC) 40 MG capsule Take 40 mg by mouth daily.    Historical Provider, MD  Omeprazole-Sodium Bicarbonate (ZEGERID) 20-1100 MG CAPS capsule Take 1 capsule by mouth daily before breakfast.    Historical Provider, MD  ondansetron (ZOFRAN) 4 MG tablet Take 1 tablet (4 mg total) by mouth every 8 (eight) hours as needed for nausea or vomiting. 10/02/15   Loren Racer, MD  potassium chloride (K-DUR,KLOR-CON) 10 MEQ tablet Take 10 mEq by mouth daily.    Historical Provider, MD  ramelteon (ROZEREM) 8 MG tablet Take 1 tablet (8 mg total) by mouth at bedtime. 08/08/14   Kathlen Mody, MD  traMADol (ULTRAM) 50 MG tablet Take 1 tablet (50 mg total) by mouth every 6 (six) hours as needed for severe pain. 08/08/14   Kathlen Mody, MD  traZODone (DESYREL) 100 MG tablet Take 1 tablet (100 mg total) by mouth at bedtime. 08/08/14   Kathlen Mody, MD    Physical Exam: Filed Vitals:   12/17/15 1849 12/17/15 2104 12/17/15 2327 12/17/15 2330  BP: 156/88 143/74 136/83   Pulse: 96 95 89   Temp: 101 F (38.3 C) 100.2 F (37.9 C) 99.2 F (37.3 C)   TempSrc: Oral Oral Oral   Resp: Height:     (1.702 m)  Weight:    89.4 kg (197 lb 1.5 oz)  SpO2: 99% 98% 98%    General: Not in acute distress HEENT:       Eyes: PERRL, EOMI, no scleral icterus or conjunctival pallor.       ENT: No discharge from the ears or nose, no pharyngeal ulcers, petechiae or exudate, no tonsillar enlargement.        Neck: No JVD, no bruit, no appreciable mass Heme: No cervical adenopathy, no pallor Cardiac: S1/S2, RRR, grade III midsystolic murmur at upper  sternal borders, No gallops or rubs. Pulm: Good air movement bilaterally. No rales, wheezing, rhonchi or rubs. Abd: Soft, nondistended, tender in lower quadrants, Rt > Lt, no rebound pain or gaurding, no mass or organomegaly, BS present. Ext: No LE edema bilaterally. 2+DP/PT pulse bilaterally. Musculoskeletal: No gross deformity, no red, hot, swollen joints  Skin: No rashes or wounds on exposed surfaces  Neuro: Alert, oriented X3, cranial nerves II-XII grossly intact. No focal findings Psych: Patient is not overtly psychotic, denies suicidal or homocidal ideation, no active hallucinations.  Labs on Admission:  Basic Metabolic Panel:  Recent Labs Lab 12/17/15 2015  NA 138  K 3.1*  CL 105  CO2 23  GLUCOSE 117*  BUN 7  CREATININE 0.66  CALCIUM 9.1   Liver Function Tests:  Recent Labs Lab 12/17/15 2015  AST 102*  ALT 49  ALKPHOS 134*  BILITOT 1.1  PROT 7.6  ALBUMIN 4.1   No results for input(s): LIPASE, AMYLASE in the last 168 hours. No results for input(s): AMMONIA in the last 168 hours. CBC:  Recent Labs Lab 12/17/15 2015  WBC 7.2  NEUTROABS 5.1  HGB 11.1*  HCT 33.0*  MCV 84.8  PLT 167   Cardiac Enzymes: No results for input(s): CKTOTAL, CKMB, CKMBINDEX, TROPONINI in the last 168 hours.  BNP (last 3 results) No results for input(s): BNP in the last 8760 hours.  ProBNP (last 3 results) No results for input(s): PROBNP in the last 8760 hours.  CBG: No results for input(s): GLUCAP in the last 168 hours.  Radiological Exams on Admission: Ct Abdomen Pelvis Wo Contrast  12/17/2015  CLINICAL DATA:  Left lower quadrant pain. EXAM: CT ABDOMEN AND PELVIS WITHOUT CONTRAST TECHNIQUE: Multidetector CT imaging of the abdomen and pelvis was performed following the standard protocol without IV contrast. COMPARISON:  CT scan dated 10/02/2015 FINDINGS: Lower chest: Stable 3 mm nodule in the left lung base on image 7 of series 3. Evidence of prior Nissen fundoplication. A  portion of the plication extends through the diaphragmatic hiatus,  unchanged. Heart size is normal. Hepatobiliary: Stable benign appearing 2.9 cm cyst in the lateral aspect of the low left lobe of the liver. Prior cholecystectomy. No bile duct dilatation. Pancreas: Normal. Spleen: Normal. Adrenals/Urinary Tract: Normal. Stomach/Bowel: There is focal acute diverticulitis of the junction of the descending colon with the sigmoid. There is also mucosal thickening in the sigmoid colon which suggests either more extensive diverticulitis or superimposed focal colitis. Vascular/Lymphatic: Scattered aortic atherosclerosis. No adenopathy. Reproductive: Uterus and ovaries have been removed. Other: Tiny amount of free fluid in the right side of the pelvis. No free air. Musculoskeletal: No acute abnormality. Chronic grade 1 spondylolisthesis and degenerative disc disease at L5-S1. IMPRESSION: 1. Acute diverticulitis at the junction of the descending colon with the sigmoid. This is a different area than on the prior exam. 2. There is more extensive mucosal thickening distal to the focal area of inflammation which could represent adherent stool in the sigmoid or superimposed focal colitis. Electronically Signed   By: Francene Boyers M.D.   On: 12/17/2015 20:17    EKG:  Not done in ED, will obtain as appropriate   Assessment/Plan  1. Diverticulitis with sepsis  - Febrile to 38.3 C and tachycardic in low 100s at [facility] prior to transfer  - CT c/w diverticulitis at junction of descending and sigmoid colon  - Also noted on CT is focal inflammatory change distal to the diverticulitis, possibly superimposed colitis or more diffuse diverticulitis  - No mention of abscess or perforation on CT  - Empiric cipro and Flagyl started at outside hospital prior to transfer, will continue that regimen here  - Clears, will advance as she improves  - Trend lactate   2. Hypokalemia  - Potassium 3.1 on arrival without symptom or EKG  change, uncertain etiology  - Check magnesium  - Replacing K+ in IVF  - Repeat chem panel in am    3. Hypertension  - Normotensive here  - Continue home-dose Norvasc, labetalol, ARB with formulary equivalent - Monitor and adjust regimen prn   4. Normocytic anemia  - Hgb 11.1 on arrival, stable from prior measurements  - Uncertain etiology  - Check iron studies, B12, folate  - Pt noted scant blood in recent stools in setting of acute diverticulitis, but no suggestion of active bleeding   5. Depression with h/o suicide attempt  - Appropriate mood and affect on exam, denies SI, HI, or hallucination  - Stable per pt  - Will continue Latuda, Cymbalta     DVT ppx:  SQ Lovenox    Code Status: Full code Family Communication: None at bed side.      Disposition Plan: Admit to inpatient   Date of Service 12/18/2015    Briscoe Deutscher, MD [facility] Pager [phone]  If 7PM-7AM, please contact night-coverage [email or web address] Password [email or web address] 12/18/2015, 12:57 AM

## 2015-12-18 NOTE — Progress Notes (Signed)
Utilization review completed. Tallie Hevia, RN, BSN. 

## 2015-12-19 DIAGNOSIS — A419 Sepsis, unspecified organism: Principal | ICD-10-CM

## 2015-12-19 DIAGNOSIS — K5792 Diverticulitis of intestine, part unspecified, without perforation or abscess without bleeding: Secondary | ICD-10-CM

## 2015-12-19 DIAGNOSIS — I10 Essential (primary) hypertension: Secondary | ICD-10-CM

## 2015-12-19 DIAGNOSIS — E876 Hypokalemia: Secondary | ICD-10-CM

## 2015-12-19 LAB — HEPATITIS PANEL, ACUTE
HEP A IGM: NEGATIVE
HEP B C IGM: NEGATIVE
HEP B S AG: NEGATIVE

## 2015-12-19 LAB — CBC
HEMATOCRIT: 30 % — AB (ref 36.0–46.0)
Hemoglobin: 10.2 g/dL — ABNORMAL LOW (ref 12.0–15.0)
MCH: 29.7 pg (ref 26.0–34.0)
MCHC: 34 g/dL (ref 30.0–36.0)
MCV: 87.5 fL (ref 78.0–100.0)
PLATELETS: 209 10*3/uL (ref 150–400)
RBC: 3.43 MIL/uL — ABNORMAL LOW (ref 3.87–5.11)
RDW: 14.7 % (ref 11.5–15.5)
WBC: 4.7 10*3/uL (ref 4.0–10.5)

## 2015-12-19 LAB — GLUCOSE, CAPILLARY: Glucose-Capillary: 100 mg/dL — ABNORMAL HIGH (ref 65–99)

## 2015-12-19 LAB — BASIC METABOLIC PANEL
Anion gap: 9 (ref 5–15)
CO2: 25 mmol/L (ref 22–32)
CREATININE: 0.84 mg/dL (ref 0.44–1.00)
Calcium: 9.3 mg/dL (ref 8.9–10.3)
Chloride: 103 mmol/L (ref 101–111)
GFR calc Af Amer: >60 mL/min (ref >60)
GLUCOSE: 100 mg/dL — AB (ref 65–99)
POTASSIUM: 3.4 mmol/L — AB (ref 3.5–5.1)
SODIUM: 137 mmol/L (ref 135–145)

## 2015-12-19 LAB — RAPID URINE DRUG SCREEN, HOSP PERFORMED
Amphetamines: NOT DETECTED
BARBITURATES: NOT DETECTED
Benzodiazepines: NOT DETECTED
Cocaine: NOT DETECTED
Opiates: POSITIVE — AB
Tetrahydrocannabinol: NOT DETECTED

## 2015-12-19 MED ORDER — POTASSIUM CHLORIDE 10 MEQ/100ML IV SOLN
10.0000 meq | INTRAVENOUS | Status: AC
  Administered 2015-12-19 (×2): 10 meq via INTRAVENOUS
  Filled 2015-12-19 (×2): qty 100

## 2015-12-19 MED ORDER — SENNA 8.6 MG PO TABS
2.0000 | ORAL_TABLET | Freq: Every day | ORAL | Status: DC
  Administered 2015-12-19 – 2015-12-21 (×3): 17.2 mg via ORAL
  Filled 2015-12-19 (×3): qty 2

## 2015-12-19 MED ORDER — DOCUSATE SODIUM 100 MG PO CAPS
100.0000 mg | ORAL_CAPSULE | Freq: Two times a day (BID) | ORAL | Status: DC
  Administered 2015-12-19 – 2015-12-21 (×4): 100 mg via ORAL
  Filled 2015-12-19 (×3): qty 1

## 2015-12-19 MED ORDER — SODIUM CHLORIDE 0.9 % IV SOLN
INTRAVENOUS | Status: DC
  Administered 2015-12-19 – 2015-12-21 (×3): via INTRAVENOUS
  Filled 2015-12-19 (×5): qty 1000

## 2015-12-19 NOTE — Progress Notes (Signed)
PROGRESS NOTE  Christina Vang:[id number] DOB: [birth year] DOA: 12/17/2015 PCP: Isaias Cowman, PA-C Brief Summary  The patient is a 65 year old female with history of hypertension, depression, paroxysmal atrial fibrillation who presented to [facility] with abdominal pain and fevers. She has had recurrent diverticulitis, last episode was 2 months ago. She presented with worsening left upper quadrant and left lower quadrant abdominal pain that was constant, severe, and worsened with movement. She had some bloody mucousy stools. In the emergency department, she was febrile and tachycardic and CT scan of the abdomen and pelvis demonstrated acute diverticulitis at the proximal sigmoid colon without abscess or perforation however she had inflammatory changes of the distal sigmoid colon that suggested superimposed lightness. She was given IV fluids and started on antibiotics and admitted. Assessment/Plan: Sepsis secondary to diverticulitis with colitis, with bloody mucousy stools. lactic acid wnl. Normally followed at [place] slowly--continue full liquids - Continue ciprofloxacin and Flagyl - Once tolerating POs, recommend antibiotics and follow up with her GI doctor at [facility] for possible repeat colonoscopy -May need surgical evaluation if no improvement -Question of constipation may be contributing to the patient's abdominal pain--started cathartics  Transaminitis in a 2:1 ratio, may be due to sepsis, minimal elevation of alk phos and bili - EtOH level--negative - Tylenol level--negative - Acute hepatitis panel--negative - IVF and repeat in AM - Minimize nephrotoxins - Hold statin  Hypokalemia, unable to tolerate much by mouth - Agree with potassium and IV fluids  Essential hypertension - Continue home-dose Norvasc, labetalol, aldactone  Paroxysmal atrial fibrillation, not on A/C - CHADs2vasc = 2 (HTN and female)  - HASBLED = 2 (with  recent rectal bleeding)  Normocytic anemia, stable from prior - Check iron studies, B12, folate  Depression with h/o suicide attempt - Appropriate mood and affect on exam, denies SI, HI, or hallucination  - Stable per pt  - Will continue Latuda, Cymbalta  Diet: FLD Access: PIV IVF: yes Proph: lovenox (carefully monitoring for rectal bleeding)  Code Status: Full Family Communication: patient alone Disposition Plan: pending improvement in abdominal pain, tolerating diet   Consultants:  none  Procedures:  CT ab/p  Antibiotics:  Cipro/flagyl 1/30    Family Communication:   Pt at beside Disposition Plan:   Home when medically stable     Procedures/Studies: Ct Abdomen Pelvis Wo Contrast  12/17/2015  CLINICAL DATA:  Left lower quadrant pain. EXAM: CT ABDOMEN AND PELVIS WITHOUT CONTRAST TECHNIQUE: Multidetector CT imaging of the abdomen and pelvis was performed following the standard protocol without IV contrast. COMPARISON:  CT scan dated 10/02/2015 FINDINGS: Lower chest: Stable 3 mm nodule in the left lung base on image 7 of series 3. Evidence of prior Nissen fundoplication. A portion of the plication extends through the diaphragmatic hiatus, unchanged. Heart size is normal. Hepatobiliary: Stable benign appearing 2.9 cm cyst in the lateral aspect of the low left lobe of the liver. Prior cholecystectomy. No bile duct dilatation. Pancreas: Normal. Spleen: Normal. Adrenals/Urinary Tract: Normal. Stomach/Bowel: There is focal acute diverticulitis of the junction of the descending colon with the sigmoid. There is also mucosal thickening in the sigmoid colon which suggests either more extensive diverticulitis or superimposed focal colitis. Vascular/Lymphatic: Scattered aortic atherosclerosis. No adenopathy. Reproductive: Uterus and ovaries have been removed. Other: Tiny amount of free fluid in the right side of the pelvis. No free air. Musculoskeletal: No acute abnormality.  Chronic grade 1 spondylolisthesis and  degenerative disc disease at L5-S1. IMPRESSION: 1. Acute diverticulitis at the junction of the descending colon with the sigmoid. This is a different area than on the prior exam. 2. There is more extensive mucosal thickening distal to the focal area of inflammation which could represent adherent stool in the sigmoid or superimposed focal colitis. Electronically Signed   By: Lorriane Shire M.D.   On: 12/17/2015 20:17         Subjective: Patient continues to complain of mucousy stools and painful defecation. Abdomen pain is now much better. But it is not worse. Denies any fevers, chills, chest pain, shortness breath, vomiting, headache, neck pain.  Objective: Filed Vitals:   12/18/15 2142 12/19/15 0417 12/19/15 0956 12/19/15 0957  BP: 145/74 134/67 118/68 118/68  Pulse: 71 65 72 72  Temp: 98.8 F (37.1 C) 98.4 F (36.9 C)    TempSrc: Oral Oral    Resp: 20 20    Height:      Weight:      SpO2: 97% 100%      Intake/Output Summary (Last 24 hours) at 12/19/15 1625 Last data filed at 12/19/15 0500  Gross per 24 hour  Intake    840 ml  Output    300 ml  Net    540 ml   Weight change:  Exam:   General:  Pt is alert, follows commands appropriately, not in acute distress  HEENT: No icterus, No thrush, No neck mass, [place]/AT  Cardiovascular: RRR, S1/S2, no rubs, no gallops  Respiratory: CTA bilaterally, no wheezing, no crackles, no rhonchi  Abdomen: Soft/+BS, LLQ tender without rebound, non distended, no guarding  Extremities: No edema, No lymphangitis, No petechiae, No rashes, no synovitis  Data Reviewed: Basic Metabolic Panel:  Recent Labs Lab 12/17/15 2015 12/18/15 0143 12/19/15 0627  NA 138 139 137  K 3.1* 3.0* 3.4*  CL 105 101 103  CO2 _0 GLUCOSE 117* 116* 100*  BUN 7 <5* <5*  CREATININE 0.66 0.80 0.84  CALCIUM 9.1 9.4 9.3   Liver Function Tests:  Recent Labs Lab 12/17/15 2015 12/18/15 0143  AST 102* 531*  ALT  49 251*  ALKPHOS 134* 206*  BILITOT 1.1 1.6*  PROT 7.6 7.5  ALBUMIN 4.1 3.9   No results for input(s): LIPASE, AMYLASE in the last 168 hours. No results for input(s): AMMONIA in the last 168 hours. CBC:  Recent Labs Lab 12/17/15 2015 12/18/15 0143 12/19/15 0627  WBC 7.2 6.4 4.7  NEUTROABS 5.1 4.6  --   HGB 11.1* 10.7* 10.2*  HCT 33.0* 32.7* 30.0*  MCV 84.8 86.3 87.5  PLT 167 237 209   Cardiac Enzymes: No results for input(s): CKTOTAL, CKMB, CKMBINDEX, TROPONINI in the last 168 hours. BNP: Invalid input(s): POCBNP CBG:  Recent Labs Lab 12/18/15 0809 12/19/15 0817  GLUCAP 119* 100*    No results found for this or any previous visit (from the past 240 hour(s)).   Scheduled Meds: . amLODipine  5 mg Oral Daily  . ciprofloxacin  400 mg Intravenous Q12H  . DULoxetine  60 mg Oral Daily  . enoxaparin (LOVENOX) injection  40 mg Subcutaneous Q24H  . labetalol  200 mg Oral BID  . loratadine  10 mg Oral Daily  . metronidazole  500 mg Intravenous Q8H  . omega-3 acid ethyl esters  1 g Oral BID  . pantoprazole  40 mg Oral Daily  . spironolactone  25 mg Oral Daily   Continuous Infusions:    ,  Kamilya Wakeman, DO  [facility] Pager [phone]  If 7PM-7AM, please contact night-coverage [email or web address] Password [email or web address] 12/19/2015, 4:25 PM   LOS: 1 day

## 2015-12-20 ENCOUNTER — Encounter (HOSPITAL_COMMUNITY): Payer: Self-pay | Admitting: General Surgery

## 2015-12-20 DIAGNOSIS — D649 Anemia, unspecified: Secondary | ICD-10-CM

## 2015-12-20 LAB — COMPREHENSIVE METABOLIC PANEL WITH GFR
ALT: 82 U/L — ABNORMAL HIGH (ref 14–54)
AST: 33 U/L (ref 15–41)
Albumin: 3.2 g/dL — ABNORMAL LOW (ref 3.5–5.0)
Alkaline Phosphatase: 126 U/L (ref 38–126)
Anion gap: 7 (ref 5–15)
BUN: <5 mg/dL — ABNORMAL LOW (ref 6–20)
CO2: 25 mmol/L (ref 22–32)
Calcium: 9.4 mg/dL (ref 8.9–10.3)
Chloride: 104 mmol/L (ref 101–111)
Creatinine, Ser: 0.86 mg/dL (ref 0.44–1.00)
GFR calc Af Amer: >60 mL/min
GFR calc non Af Amer: >60 mL/min
Glucose, Bld: 96 mg/dL (ref 65–99)
Potassium: 3.7 mmol/L (ref 3.5–5.1)
Sodium: 136 mmol/L (ref 135–145)
Total Bilirubin: 0.7 mg/dL (ref 0.3–1.2)
Total Protein: 6.2 g/dL — ABNORMAL LOW (ref 6.5–8.1)

## 2015-12-20 LAB — CBC
HEMATOCRIT: 28.1 % — AB (ref 36.0–46.0)
HEMOGLOBIN: 9.6 g/dL — AB (ref 12.0–15.0)
MCH: 29.4 pg (ref 26.0–34.0)
MCHC: 34.2 g/dL (ref 30.0–36.0)
MCV: 85.9 fL (ref 78.0–100.0)
Platelets: 213 10*3/uL (ref 150–400)
RBC: 3.27 MIL/uL — ABNORMAL LOW (ref 3.87–5.11)
RDW: 14.2 % (ref 11.5–15.5)
WBC: 4.8 10*3/uL (ref 4.0–10.5)

## 2015-12-20 LAB — IRON AND TIBC
Iron: 30 ug/dL (ref 28–170)
Saturation Ratios: 13 % (ref 10.4–31.8)
TIBC: 228 ug/dL — ABNORMAL LOW (ref 250–450)
UIBC: 198 ug/dL

## 2015-12-20 LAB — VITAMIN B12: Vitamin B-12: 1371 pg/mL — ABNORMAL HIGH (ref 180–914)

## 2015-12-20 LAB — FERRITIN: Ferritin: 132 ng/mL (ref 11–307)

## 2015-12-20 LAB — GLUCOSE, CAPILLARY: Glucose-Capillary: 110 mg/dL — ABNORMAL HIGH (ref 65–99)

## 2015-12-20 NOTE — Consult Note (Signed)
Reason for Consult: acute diverticulitis  Referring Physician: Dr. Shanon Brow Tat   HPI: Christina Vang is a 65 year old female with a history of HTN, GERD s/p Nissen, PAF, cholecystectomy, umbilical hernia repair at age 29, hysterectomy, depression, MVP, hyperparathyroidism and fibromyalgia who was admitted 12/16/14 with diverticulitis.  She has a known history of diverticulitis with multiple episodes of acute diverticulitis.  She has been followed by Dr.Thorne at [facility]  Last colonoscopy in January of 2015 revealed moderate diverticulosis to descending colon.  She was treated with a bout in November and apparently 1 additional time in 2016, but I did not find any records of this.  The patient had a CT scan in September of 2016 which showed diverticulosis, but no acute flare.  CT scan in November 2016 revealed with sigmoid colon diverticulitis.  Lastly, CT on 12/17/15 acute diverticulitis at the junction of the descending colon with the sigmoid.  The patient was admitted and placed on cipro/flagyl.  Pain has improved and today is the best day she has had so far. She reports constipation which is longstanding and takes linzess for apparent irritable bowel syndrome.    The patient reports developing llq abdominal about about 2 weeks ago which was associated with 2 episodes of emesis. Since then, the pain progressively worsened.  Endorses to chills, sweats, fevers and anorexia.  Also endorses to hematochezia. Although, colonoscopy revealed both internal and external hemorrhoids on last colonoscopy.   Since admission, the patient has had a normal white count.  Initially had a temp of 38.3 along with tachycardia.  We have been asked to evaluate the patient for recurrent uncomlicated diverticulitis.     Past Medical History  Diagnosis Date  . Fibromyalgia   . Mitral valve prolapse   . Hypertension   . Hyperparathyroidism ([place])   . GERD (gastroesophageal reflux disease)   . Depression   . High  cholesterol   . Atrial fibrillation ([place])   . Bronchial asthma   . Pneumonia X 2  . Anemia   . H/O hiatal hernia   . Daily headache     "recently" (07/31/2014)  . Arthritis   . Osteopenia   . Chronic back pain   . Anxiety   . Acute renal disease   . Chronic kidney disease (CKD), stage II (mild)   . Diverticulitis     Past Surgical History  Procedure Laterality Date  . Nissen fundoplication  1660'Y  . Knee arthroscopy Left 1990's  . Cholecystectomy  ~ 2008  . Liver biopsy  1990's    "fatty liver"  . Tonsillectomy and adenoidectomy  ~ 1960  . Hernia repair    . Vaginal hysterectomy  1980's  . Tubal ligation  1972  . Cardiac catheterization      "several"  . Transurethral resection of bladder tumor with gyrus (turbt-gyrus)  ~ 12/2013    Family History  Problem Relation Age of Onset  . Hypertension Mother   . Stroke Mother   . Diabetes Mother   . Cancer Mother   . Hypertension Father   . Stroke Father   . Lung cancer Father   . Diabetes Sister   . Diabetes Brother   . Pancreatic cancer Paternal Aunt     Social History:  reports that she has never smoked. She has never used smokeless tobacco. She reports that she does not drink alcohol or use illicit drugs.  Allergies:  Allergies  Allergen Reactions  . Augmentin [Amoxicillin-Pot Clavulanate] Nausea And Vomiting  .  Codeine Nausea And Vomiting  . Tetracyclines & Related Nausea And Vomiting    Medications:  Scheduled Meds: . amLODipine  5 mg Oral Daily  . ciprofloxacin  400 mg Intravenous Q12H  . docusate sodium  100 mg Oral BID  . DULoxetine  60 mg Oral Daily  . enoxaparin (LOVENOX) injection  40 mg Subcutaneous Q24H  . labetalol  200 mg Oral BID  . loratadine  10 mg Oral Daily  . metronidazole  500 mg Intravenous Q8H  . omega-3 acid ethyl esters  1 g Oral BID  . pantoprazole  40 mg Oral Daily  . senna  2 tablet Oral Daily  . spironolactone  25 mg Oral Daily   Continuous Infusions: . sodium chloride 0.9  % 1,000 mL with potassium chloride 20 mEq infusion 75 mL/hr at 12/19/15 1703   PRN Meds:.albuterol, HYDROcodone-acetaminophen, morphine injection, ondansetron **OR** ondansetron (ZOFRAN) IV, zolpidem   Results for orders placed or performed during the hospital encounter of 12/17/15 (from the past 48 hour(s))  Urine rapid drug screen (hosp performed)     Status: Abnormal   Collection Time: 12/19/15 12:37 AM  Result Value Ref Range   Opiates POSITIVE (A) NONE DETECTED   Cocaine NONE DETECTED NONE DETECTED   Benzodiazepines NONE DETECTED NONE DETECTED   Amphetamines NONE DETECTED NONE DETECTED   Tetrahydrocannabinol NONE DETECTED NONE DETECTED   Barbiturates NONE DETECTED NONE DETECTED    Comment:        DRUG SCREEN FOR MEDICAL PURPOSES ONLY.  IF CONFIRMATION IS NEEDED FOR ANY PURPOSE, NOTIFY LAB WITHIN 5 DAYS.        LOWEST DETECTABLE LIMITS FOR URINE DRUG SCREEN Drug Class       Cutoff (ng/mL) Amphetamine      1000 Barbiturate      200 Benzodiazepine   563 Tricyclics       875 Opiates          300 Cocaine          300 THC              50   Basic metabolic panel     Status: Abnormal   Collection Time: 12/19/15  6:27 AM  Result Value Ref Range   Sodium 137 135 - 145 mmol/L   Potassium 3.4 (L) 3.5 - 5.1 mmol/L   Chloride 103 101 - 111 mmol/L   CO2 25 22 - 32 mmol/L   Glucose, Bld 100 (H) 65 - 99 mg/dL   BUN <5 (L) 6 - 20 mg/dL   Creatinine, Ser 0.84 0.44 - 1.00 mg/dL   Calcium 9.3 8.9 - 10.3 mg/dL   GFR calc non Af Amer >60 >60 mL/min   GFR calc Af Amer >60 >60 mL/min    Comment: (NOTE) The eGFR has been calculated using the CKD EPI equation. This calculation has not been validated in all clinical situations. eGFR's persistently <60 mL/min signify possible Chronic Kidney Disease.    Anion gap 9 5 - 15  CBC     Status: Abnormal   Collection Time: 12/19/15  6:27 AM  Result Value Ref Range   WBC 4.7 4.0 - 10.5 K/uL   RBC 3.43 (L) 3.87 - 5.11 MIL/uL   Hemoglobin 10.2  (L) 12.0 - 15.0 g/dL   HCT 30.0 (L) 36.0 - 46.0 %   MCV 87.5 78.0 - 100.0 fL   MCH 29.7 26.0 - 34.0 pg   MCHC 34.0 30.0 - 36.0 g/dL   RDW 14.7 11.5 -  15.5 %   Platelets 209 150 - 400 K/uL  Glucose, capillary     Status: Abnormal   Collection Time: 12/19/15  8:17 AM  Result Value Ref Range   Glucose-Capillary 100 (H) 65 - 99 mg/dL  Comprehensive metabolic panel     Status: Abnormal   Collection Time: 12/20/15  5:27 AM  Result Value Ref Range   Sodium 136 135 - 145 mmol/L   Potassium 3.7 3.5 - 5.1 mmol/L   Chloride 104 101 - 111 mmol/L   CO2 25 22 - 32 mmol/L   Glucose, Bld 96 65 - 99 mg/dL   BUN <5 (L) 6 - 20 mg/dL   Creatinine, Ser 0.86 0.44 - 1.00 mg/dL   Calcium 9.4 8.9 - 10.3 mg/dL   Total Protein 6.2 (L) 6.5 - 8.1 g/dL   Albumin 3.2 (L) 3.5 - 5.0 g/dL   AST 33 15 - 41 U/L   ALT 82 (H) 14 - 54 U/L   Alkaline Phosphatase 126 38 - 126 U/L   Total Bilirubin 0.7 0.3 - 1.2 mg/dL   GFR calc non Af Amer >60 >60 mL/min   GFR calc Af Amer >60 >60 mL/min    Comment: (NOTE) The eGFR has been calculated using the CKD EPI equation. This calculation has not been validated in all clinical situations. eGFR's persistently <60 mL/min signify possible Chronic Kidney Disease.    Anion gap 7 5 - 15  CBC     Status: Abnormal   Collection Time: 12/20/15  5:27 AM  Result Value Ref Range   WBC 4.8 4.0 - 10.5 K/uL   RBC 3.27 (L) 3.87 - 5.11 MIL/uL   Hemoglobin 9.6 (L) 12.0 - 15.0 g/dL   HCT 28.1 (L) 36.0 - 46.0 %   MCV 85.9 78.0 - 100.0 fL   MCH 29.4 26.0 - 34.0 pg   MCHC 34.2 30.0 - 36.0 g/dL   RDW 14.2 11.5 - 15.5 %   Platelets 213 150 - 400 K/uL  Iron and TIBC     Status: Abnormal   Collection Time: 12/20/15  5:27 AM  Result Value Ref Range   Iron 30 28 - 170 ug/dL   TIBC 228 (L) 250 - 450 ug/dL   Saturation Ratios 13 10.4 - 31.8 %   UIBC 198 ug/dL  Ferritin     Status: None   Collection Time: 12/20/15  5:27 AM  Result Value Ref Range   Ferritin 132 11 - 307 ng/mL  Vitamin B12      Status: Abnormal   Collection Time: 12/20/15  5:27 AM  Result Value Ref Range   Vitamin B-12 1371 (H) 180 - 914 pg/mL    Comment: (NOTE) This assay is not validated for testing neonatal or myeloproliferative syndrome specimens for Vitamin B12 levels.   Glucose, capillary     Status: Abnormal   Collection Time: 12/20/15  7:46 AM  Result Value Ref Range   Glucose-Capillary 110 (H) 65 - 99 mg/dL    No results found.  Review of Systems  Constitutional: Negative for fever, chills, weight loss, malaise/fatigue and diaphoresis.  Eyes: Negative for blurred vision, double vision, photophobia, pain, discharge and redness.  Respiratory: Negative for cough, hemoptysis, sputum production, shortness of breath and wheezing.   Cardiovascular: Negative for chest pain, palpitations, orthopnea, claudication, leg swelling and PND.  Gastrointestinal: Positive for abdominal pain. Negative for heartburn, nausea, vomiting, diarrhea, constipation, blood in stool and melena.  Genitourinary: Negative for dysuria, urgency, frequency, hematuria and  flank pain.  Neurological: Negative for dizziness, tingling, tremors, sensory change, speech change, focal weakness, seizures, loss of consciousness and weakness.  Psychiatric/Behavioral: Positive for depression. Negative for suicidal ideas and substance abuse.   Blood pressure 121/80, pulse 87, temperature 99.4 F (37.4 C), temperature source Oral, resp. rate 18, height _0  (1.702 m), weight 89.4 kg (197 lb 1.5 oz), SpO2 94 %. Physical Exam  Constitutional: She is oriented to person, place, and time. She appears well-developed and well-nourished. She appears distressed.  Cardiovascular: Normal rate, regular rhythm, normal heart sounds and intact distal pulses.  Exam reveals no gallop and no friction rub.   2/6 SEM  Respiratory: Effort normal and breath sounds normal.  GI: Soft. Bowel sounds are normal. She exhibits no distension and no mass. There is no rebound  and no guarding.  Mild TTP LLQ.   Musculoskeletal: Normal range of motion. She exhibits no edema or tenderness.  Neurological: She is alert and oriented to person, place, and time.  Skin: Skin is warm and dry. No rash noted. She is not diaphoretic. No erythema. No pallor.  Psychiatric: She has a normal mood and affect. Her behavior is normal. Judgment and thought content normal.    Assessment/Plan: Acute descending sigmoid diverticulitis-clinically improving.  Agree with treatment plan, antibiotics, advance diet as tolerated and discharge when improved.  She needs a repeat colonoscopy as CTs note different areas of disease than colonoscopy in 2015.  She sees Dr. Carlton Adam at [facility], however, wishes to establish here in town.  She has seen Dr. Olevia Perches in the past.  She can follow up with Dr. Leighton Ruff on outpatient basis after her colonoscopy for further discussion on elective bowel resection versus continued medical management.  The patient verbalizes understanding.  Thank you for the consult.  Please do not hesitate to contact [facility] for further assistance.   Mauri Tolen ANP-BC  12/20/2015, 1:23 PM

## 2015-12-20 NOTE — Progress Notes (Signed)
PROGRESS NOTE  Christina Vang KCL:[id number] DOB: 1950-12-16 DOA: 12/17/2015 PCP: Isaias Cowman, PA-C  Brief Summary  The patient is a 65 year old female with history of hypertension, depression, paroxysmal atrial fibrillation who presented to [facility] with abdominal pain and fevers. She has had recurrent diverticulitis, last episode was 2 months ago. She presented with worsening left upper quadrant and left lower quadrant abdominal pain that was constant, severe, and worsened with movement. She had some bloody mucousy stools. In the emergency department, she was febrile and tachycardic and CT scan of the abdomen and pelvis demonstrated acute diverticulitis at the proximal sigmoid colon without abscess or perforation however she had inflammatory changes of the distal sigmoid colon that suggested superimposed lightness. She was given IV fluids and started on antibiotics and admitted. Assessment/Plan: Sepsis secondary to diverticulitis with colitis, with bloody mucousy stools. lactic acid wnl. Normally followed at [place] slowly--advance to soft diet - Continue ciprofloxacin and Flagyl - Once tolerating POs, recommend antibiotics and follow up with her GI doctor at [facility] for possible repeat colonoscopy -May need surgical evaluation if no improvement -Question of constipation may be contributing to the patient's abdominal pain--started cathartics -add lactulose -Consult to surgery as the patient claims that this is her third episode of diverticulitis in the past 12 months. -appreciate surgical consult-->outpt follow up for elective resection at some point will need colonoscopy first  Transaminitis in a 2:1 ratio, may be due to sepsis, minimal elevation of alk phos and bili - EtOH level--negative - Tylenol level--negative - Acute hepatitis panel--negative - IVF and repeat in AM - Minimize nephrotoxins - Hold  statin -improved  Hypokalemia - Agree with potassium and IV fluids - repleted  Essential hypertension - Continue home-dose Norvasc, labetalol, aldactone  Paroxysmal atrial fibrillation, not on A/C - CHADs2vasc = 2 (HTN and female)  - HASBLED = 2 (with recent rectal bleeding) - presently in sinus - restart ASA after d/c  Normocytic anemia, stable from prior - Check iron studies--iron sat 13% -B12---1371 -RBC folate pending  Depression with h/o suicide attempt - Appropriate mood and affect on exam, denies SI, HI, or hallucination  - Stable per pt  - Will continue Cymbalta   Access: PIV IVF: yes  Code Status: Full Family Communication: patient alone Disposition Plan: 12/21/15 if stable and tolerates diet    Procedures/Studies: Ct Abdomen Pelvis Wo Contrast  12/17/2015  CLINICAL DATA:  Left lower quadrant pain. EXAM: CT ABDOMEN AND PELVIS WITHOUT CONTRAST TECHNIQUE: Multidetector CT imaging of the abdomen and pelvis was performed following the standard protocol without IV contrast. COMPARISON:  CT scan dated 10/02/2015 FINDINGS: Lower chest: Stable 3 mm nodule in the left lung base on image 7 of series 3. Evidence of prior Nissen fundoplication. A portion of the plication extends through the diaphragmatic hiatus, unchanged. Heart size is normal. Hepatobiliary: Stable benign appearing 2.9 cm cyst in the lateral aspect of the low left lobe of the liver. Prior cholecystectomy. No bile duct dilatation. Pancreas: Normal. Spleen: Normal. Adrenals/Urinary Tract: Normal. Stomach/Bowel: There is focal acute diverticulitis of the junction of the descending colon with the sigmoid. There is also mucosal thickening in the sigmoid colon which suggests either more extensive diverticulitis or superimposed focal colitis. Vascular/Lymphatic: Scattered aortic atherosclerosis. No adenopathy. Reproductive: Uterus and ovaries have been removed. Other: Tiny amount of free fluid in the right side of  the pelvis. No free air. Musculoskeletal: No acute abnormality.  Chronic grade 1 spondylolisthesis and degenerative disc disease at L5-S1. IMPRESSION: 1. Acute diverticulitis at the junction of the descending colon with the sigmoid. This is a different area than on the prior exam. 2. There is more extensive mucosal thickening distal to the focal area of inflammation which could represent adherent stool in the sigmoid or superimposed focal colitis. Electronically Signed   By: Lorriane Shire M.D.   On: 12/17/2015 20:17         Subjective: Patient states that abdominal pain is gradually improving. She is still passing flatus but has not had a bowel movement. Denies fevers, chills, chest pain, short of breath, vomiting, diarrhea, hematochezia, melena. No headache or neck pain.  Objective: Filed Vitals:   12/19/15 2117 12/20/15 0940 12/20/15 0944 12/20/15 1411  BP: 127/61 121/80 121/80 135/70  Pulse: 70  87 66  Temp: 99.4 F (37.4 C)   98.6 F (37 C)  TempSrc: Oral   Oral  Resp: 18   21  Height:      Weight:      SpO2: 94%   96%    Intake/Output Summary (Last 24 hours) at 12/20/15 1654 Last data filed at 12/20/15 1500  Gross per 24 hour  Intake   2125 ml  Output      0 ml  Net   2125 ml   Weight change:  Exam:   General:  Pt is alert, follows commands appropriately, not in acute distress  HEENT: No icterus, No thrush, No neck mass, [place]/AT  Cardiovascular: RRR, S1/S2, no rubs, no gallops  Respiratory: CTA bilaterally, no wheezing, no crackles, no rhonchi  Abdomen: Soft/+BS, LLQ tender without rebound, non distended, no guarding  Extremities: No edema, No lymphangitis, No petechiae, No rashes, no synovitis  Data Reviewed: Basic Metabolic Panel:  Recent Labs Lab 12/17/15 2015 12/18/15 0143 12/19/15 0627 12/20/15 0527  NA 138 139 137 136  K 3.1* 3.0* 3.4* 3.7  CL 105 101 103 104  CO2 '23 26 25 25  '$ GLUCOSE 117* 116* 100* 96  BUN 7 <5* <5* <5*  CREATININE 0.66 0.80  0.84 0.86  CALCIUM 9.1 9.4 9.3 9.4   Liver Function Tests:  Recent Labs Lab 12/17/15 2015 12/18/15 0143 12/20/15 0527  AST 102* 531* 33  ALT 49 251* 82*  ALKPHOS 134* 206* 126  BILITOT 1.1 1.6* 0.7  PROT 7.6 7.5 6.2*  ALBUMIN 4.1 3.9 3.2*   No results for input(s): LIPASE, AMYLASE in the last 168 hours. No results for input(s): AMMONIA in the last 168 hours. CBC:  Recent Labs Lab 12/17/15 2015 12/18/15 0143 12/19/15 0627 12/20/15 0527  WBC 7.2 6.4 4.7 4.8  NEUTROABS 5.1 4.6  --   --   HGB 11.1* 10.7* 10.2* 9.6*  HCT 33.0* 32.7* 30.0* 28.1*  MCV 84.8 86.3 87.5 85.9  PLT 167 237 209 213   Cardiac Enzymes: No results for input(s): CKTOTAL, CKMB, CKMBINDEX, TROPONINI in the last 168 hours. BNP: Invalid input(s): POCBNP CBG:  Recent Labs Lab 12/18/15 0809 12/19/15 0817 12/20/15 0746  GLUCAP 119* 100* 110*    No results found for this or any previous visit (from the past 240 hour(s)).   Scheduled Meds: . amLODipine  5 mg Oral Daily  . ciprofloxacin  400 mg Intravenous Q12H  . docusate sodium  100 mg Oral BID  . DULoxetine  60 mg Oral Daily  . enoxaparin (LOVENOX) injection  40 mg Subcutaneous Q24H  . labetalol  200 mg Oral BID  .  loratadine  10 mg Oral Daily  . metronidazole  500 mg Intravenous Q8H  . omega-3 acid ethyl esters  1 g Oral BID  . pantoprazole  40 mg Oral Daily  . senna  2 tablet Oral Daily  . spironolactone  25 mg Oral Daily   Continuous Infusions: . sodium chloride 0.9 % 1,000 mL with potassium chloride 20 mEq infusion 75 mL/hr at 12/20/15 1529     Bocephus Cali, DO  [facility] Pager [phone]  If 7PM-7AM, please contact night-coverage [email or web address] Password [email or web address] 12/20/2015, 4:54 PM   LOS: 2 days

## 2015-12-21 LAB — BASIC METABOLIC PANEL
ANION GAP: 11 (ref 5–15)
BUN: <5 mg/dL — ABNORMAL LOW (ref 6–20)
CALCIUM: 9.8 mg/dL (ref 8.9–10.3)
CO2: 27 mmol/L (ref 22–32)
CREATININE: 0.8 mg/dL (ref 0.44–1.00)
Chloride: 100 mmol/L — ABNORMAL LOW (ref 101–111)
GLUCOSE: 100 mg/dL — AB (ref 65–99)
Potassium: 4 mmol/L (ref 3.5–5.1)
Sodium: 138 mmol/L (ref 135–145)

## 2015-12-21 LAB — GLUCOSE, CAPILLARY: Glucose-Capillary: 96 mg/dL (ref 65–99)

## 2015-12-21 LAB — FOLATE RBC
FOLATE, HEMOLYSATE: 483.3 ng/mL
FOLATE, RBC: 1696 ng/mL (ref >498)
HEMATOCRIT: 28.5 % — AB (ref 34.0–46.6)

## 2015-12-21 MED ORDER — METRONIDAZOLE 500 MG PO TABS: 500.0000 mg | ORAL_TABLET | Freq: Three times a day (TID) | ORAL | Status: DC

## 2015-12-21 MED ORDER — DOCUSATE SODIUM 100 MG PO CAPS: 100.0000 mg | ORAL_CAPSULE | Freq: Two times a day (BID) | ORAL | Status: DC

## 2015-12-21 MED ORDER — LABETALOL HCL 200 MG PO TABS: 200.0000 mg | ORAL_TABLET | Freq: Two times a day (BID) | ORAL | Status: AC

## 2015-12-21 MED ORDER — METRONIDAZOLE 500 MG PO TABS
500.0000 mg | ORAL_TABLET | Freq: Three times a day (TID) | ORAL | Status: DC
  Administered 2015-12-21: 500 mg via ORAL
  Filled 2015-12-21: qty 1

## 2015-12-21 MED ORDER — CIPROFLOXACIN HCL 500 MG PO TABS: 500.0000 mg | ORAL_TABLET | Freq: Two times a day (BID) | ORAL | Status: DC

## 2015-12-21 MED ORDER — AMLODIPINE BESYLATE 5 MG PO TABS: 5.0000 mg | ORAL_TABLET | Freq: Every day | ORAL | Status: AC

## 2015-12-21 MED FILL — ?AMLODIPINE BESYLATE 5 MG T: 5 | 30 days supply | Qty: 30 | Fill #0

## 2015-12-21 MED FILL — ?CIPROFLOXACIN HCL 500MG TA: 500 | 6 days supply | Qty: 12 | Fill #0

## 2015-12-21 MED FILL — metroNIDAZOLE 500 MG TABS: 500 | 6 days supply | Qty: 18 | Fill #0

## 2015-12-21 NOTE — Discharge Summary (Signed)
Physician Discharge Summary  VAN EHLERT TGG:[id number] DOB: March 02, 1951 DOA: 12/17/2015  PCP: Isaias Cowman, PA-C  Admit date: 12/17/2015 Discharge date: 12/21/2015  Recommendations for Outpatient Follow-up:  1. Pt will need to follow up with PCP in 2 weeks post discharge   Discharge Diagnoses:  Sepsis secondary to diverticulitis with colitis, with bloody mucousy stools. lactic acid wnl. Normally followed at [place] slowly--advance to soft diet - Continue ciprofloxacin and Flagyl for 6 more days after d/c to finish 10 days of therapy -  follow up with her GI doctor at [facility] for possible repeat colonoscopy -diet was gradually advanced and pt tolerated it -Question of constipation may be contributing to the patient's abdominal pain--started cathartics -discussed importance of daily bowel regimen after d/c -Consulted surgery as the patient claims that this is her third episode of diverticulitis in the past 12 months. -appreciate surgical consult-->outpt follow up for elective resection at some point will need colonoscopy first  Transaminitis in a 2:1 ratio, may be due to sepsis, minimal elevation of alk phos and bili - EtOH level--negative - Tylenol level--negative - Acute hepatitis panel--negative - Hold statin until pt tolerated po -improved  Hypokalemia - Agree with potassium and IV fluids - repleted  Essential hypertension - Continue home-dose Norvasc, labetalol, aldactone  Paroxysmal atrial fibrillation, not on A/C - CHADs2vasc = 2 (HTN and female)  - HASBLED = 2 (with recent rectal bleeding) - presently in sinus - restart ASA after d/c  Normocytic anemia, stable from prior - Check iron studies--iron sat 13% -B12---1371 -RBC folate--1696  Depression with h/o suicide attempt - Appropriate mood and affect on exam, denies SI, HI, or hallucination  - Stable per pt  - Will continue Cymbalta  Discharge Condition:  stable  Disposition:  Follow-up Information    Follow up with Rosario Adie., MD.   Specialty:  General Surgery   Why:  once you see gastroenterologist to discuss any surgical options.    Contact information:   [place] [address] [phone]       Follow up with [facility] on 01/21/2016.   Why:   Follow up appointment with Dr Jacinta Shoe at 2:15pm   Contact information:   [place] [id number]       Diet:soft Wt Readings from Last 3 Encounters:  12/17/15 89.4 kg (197 lb 1.5 oz)  10/31/15 87.544 kg (193 lb)  10/01/15 87.714 kg (193 lb 6 oz)    History of present illness:  The patient is a 65 year old female with history of hypertension, depression, paroxysmal atrial fibrillation who presented to [facility] with abdominal pain and fevers. She has had recurrent diverticulitis, last episode was 2 months ago. She presented with worsening left upper quadrant and left lower quadrant abdominal pain that was constant, severe, and worsened with movement. She had some bloody mucousy stools. In the emergency department, she was febrile and tachycardic and CT scan of the abdomen and pelvis demonstrated acute diverticulitis at the proximal sigmoid colon without abscess or perforation however she had inflammatory changes of the distal sigmoid colon that suggested superimposed lightness. She was given IV fluids and started on antibiotics and admitted.  She improved clinically.  Her diet was gradually advanced which she tolerated.  She will be d/c home with po cipro and flagyl x 6 more days  Consultants: General surgery  Discharge Exam: Filed Vitals:   12/21/15 3716  12/21/15 0900  BP: 136/69 145/83  Pulse: 73 80  Temp: 99 F (37.2 C)   Resp: 14    Filed Vitals:   12/20/15 2158 12/20/15 2358 12/21/15 0633 12/21/15 0900  BP: 146/83 121/56 136/69 145/83  Pulse: 74 65 73 80  Temp: 98.9 F (37.2 C)   99 F (37.2 C)   TempSrc: Oral  Oral   Resp: 15  14   Height:      Weight:      SpO2: 100%  97%    General: A&O x 3, NAD, pleasant, cooperative Cardiovascular: RRR, no rub, no gallop, no S3 Respiratory: CTAB, no wheeze, no rhonchi Abdomen:soft, minimal LLQ tender without rebound, nondistended, positive bowel sounds Extremities: No edema, No lymphangitis, no petechiae  Discharge Instructions      Discharge Instructions    Diet - low sodium heart healthy    Complete by:  As directed      Increase activity slowly    Complete by:  As directed             Medication List    STOP taking these medications        traMADol 50 MG tablet  Commonly known as:  ULTRAM      TAKE these medications        albuterol 108 (90 Base) MCG/ACT inhaler  Commonly known as:  PROVENTIL HFA;VENTOLIN HFA  Inhale 2 puffs into the lungs 2 (two) times daily as needed for wheezing or shortness of breath.     amLODipine 5 MG tablet  Commonly known as:  NORVASC  Take 1 tablet (5 mg total) by mouth daily.     aspirin EC 81 MG tablet  Take 81 mg by mouth daily.     atorvastatin 20 MG tablet  Commonly known as:  LIPITOR  Take 20 mg by mouth at bedtime.     cetirizine 10 MG tablet  Commonly known as:  ZYRTEC  Take 10 mg by mouth daily.     cholecalciferol 1000 units tablet  Commonly known as:  VITAMIN D  Take 1,000 Units by mouth daily.     ciprofloxacin 500 MG tablet  Commonly known as:  CIPRO  Take 1 tablet (500 mg total) by mouth 2 (two) times daily.     diclofenac sodium 1 % Gel  Commonly known as:  VOLTAREN  Apply 1 application topically 2 (two) times daily as needed (pain).     DSS 100 MG Caps  Take 200 mg by mouth 2 (two) times daily.     docusate sodium 100 MG capsule  Commonly known as:  COLACE  Take 1 capsule (100 mg total) by mouth 2 (two) times daily.     DULoxetine 60 MG capsule  Commonly known as:  CYMBALTA  Take 60 mg by mouth daily as needed (pain).      eplerenone 50 MG tablet  Commonly known as:  INSPRA  Take 100 mg by mouth 2 (two) times daily.     eszopiclone 2 MG Tabs tablet  Commonly known as:  LUNESTA  Take 2 mg by mouth at bedtime as needed for sleep. Take immediately before bedtime     gabapentin 100 MG capsule  Commonly known as:  NEURONTIN  Take 100 mg by mouth 3 (three) times daily.     labetalol 200 MG tablet  Commonly known as:  NORMODYNE  Take 1 tablet (200 mg total) by mouth 2 (two) times daily.     Karlene Einstein  Flushing capsule  Generic drug:  Linaclotide  Take 290 mcg by mouth daily.     meloxicam 15 MG tablet  Commonly known as:  MOBIC  Take 15 mg by mouth daily.     metroNIDAZOLE 500 MG tablet  Commonly known as:  FLAGYL  Take 1 tablet (500 mg total) by mouth every 8 (eight) hours.     omega-3 acid ethyl esters 1 g capsule  Commonly known as:  LOVAZA  Take 1 g by mouth 2 (two) times daily.     omeprazole 40 MG capsule  Commonly known as:  PRILOSEC  Take 40 mg by mouth daily.         The results of significant diagnostics from this hospitalization (including imaging, microbiology, ancillary and laboratory) are listed below for reference.    Significant Diagnostic Studies: Ct Abdomen Pelvis Wo Contrast  12/17/2015  CLINICAL DATA:  Left lower quadrant pain. EXAM: CT ABDOMEN AND PELVIS WITHOUT CONTRAST TECHNIQUE: Multidetector CT imaging of the abdomen and pelvis was performed following the standard protocol without IV contrast. COMPARISON:  CT scan dated 10/02/2015 FINDINGS: Lower chest: Stable 3 mm nodule in the left lung base on image 7 of series 3. Evidence of prior Nissen fundoplication. A portion of the plication extends through the diaphragmatic hiatus, unchanged. Heart size is normal. Hepatobiliary: Stable benign appearing 2.9 cm cyst in the lateral aspect of the low left lobe of the liver. Prior cholecystectomy. No bile duct dilatation. Pancreas: Normal. Spleen: Normal. Adrenals/Urinary Tract:  Normal. Stomach/Bowel: There is focal acute diverticulitis of the junction of the descending colon with the sigmoid. There is also mucosal thickening in the sigmoid colon which suggests either more extensive diverticulitis or superimposed focal colitis. Vascular/Lymphatic: Scattered aortic atherosclerosis. No adenopathy. Reproductive: Uterus and ovaries have been removed. Other: Tiny amount of free fluid in the right side of the pelvis. No free air. Musculoskeletal: No acute abnormality. Chronic grade 1 spondylolisthesis and degenerative disc disease at L5-S1. IMPRESSION: 1. Acute diverticulitis at the junction of the descending colon with the sigmoid. This is a different area than on the prior exam. 2. There is more extensive mucosal thickening distal to the focal area of inflammation which could represent adherent stool in the sigmoid or superimposed focal colitis. Electronically Signed   By: Lorriane Shire M.D.   On: 12/17/2015 20:17     Microbiology: No results found for this or any previous visit (from the past 240 hour(s)).   Labs: Basic Metabolic Panel:  Recent Labs Lab 12/17/15 2015 12/18/15 0143 12/19/15 0627 12/20/15 0527 12/21/15 0604  NA 138 139 137 136 138  K 3.1* 3.0* 3.4* 3.7 4.0  CL 105 101 103 104 100*  CO2 '23 26 25 25 27  '$ GLUCOSE 117* 116* 100* 96 100*  BUN 7 <5* <5* <5* <5*  CREATININE 0.66 0.80 0.84 0.86 0.80  CALCIUM 9.1 9.4 9.3 9.4 9.8   Liver Function Tests:  Recent Labs Lab 12/17/15 2015 12/18/15 0143 12/20/15 0527  AST 102* 531* 33  ALT 49 251* 82*  ALKPHOS 134* 206* 126  BILITOT 1.1 1.6* 0.7  PROT 7.6 7.5 6.2*  ALBUMIN 4.1 3.9 3.2*   No results for input(s): LIPASE, AMYLASE in the last 168 hours. No results for input(s): AMMONIA in the last 168 hours. CBC:  Recent Labs Lab 12/17/15 2015 12/18/15 0143 12/19/15 0627 12/20/15 0527  WBC 7.2 6.4 4.7 4.8  NEUTROABS 5.1 4.6  --   --   HGB 11.1* 10.7*  10.2* 9.6*  HCT 33.0* 32.7* 30.0* 28.1*   28.5*  MCV 84.8 86.3 87.5 85.9  PLT 167 237 209 213   Cardiac Enzymes: No results for input(s): CKTOTAL, CKMB, CKMBINDEX, TROPONINI in the last 168 hours. BNP: Invalid input(s): POCBNP CBG:  Recent Labs Lab 12/18/15 0809 12/19/15 0817 12/20/15 0746 12/21/15 0804  GLUCAP 119* 100* 110* 96    Time coordinating discharge:  Greater than 30 minutes  Signed:  Ruven Corradi, DO [facility] Pager: [phone] 12/21/2015, 2:00 PM

## 2015-12-21 NOTE — Progress Notes (Signed)
NURSING PROGRESS NOTE  Christina Vang [id number] Discharge Data: 12/21/2015 2:59 PM Attending Provider: Catarina Hartshorn, MD WUJ:[id number], Casimiro Needle, PA-C     Loney Loh to be D/C'd Home per MD order.  Discussed with the patient the After Visit Summary and all questions fully answered. All IV's discontinued with no bleeding noted. All belongings returned to patient for patient to take home.   Last Vital Signs:  Blood pressure 144/81, pulse 74, temperature 98.5 F (36.9 C), temperature source Oral, resp. rate 14, height  (1.702 m), weight 89.4 kg (197 lb 1.5 oz), SpO2 99 %.  Discharge Medication List   Medication List    STOP taking these medications        traMADol 50 MG tablet  Commonly known as:  ULTRAM      TAKE these medications        albuterol 108 (90 Base) MCG/ACT inhaler  Commonly known as:  PROVENTIL HFA;VENTOLIN HFA  Inhale 2 puffs into the lungs 2 (two) times daily as needed for wheezing or shortness of breath.     amLODipine 5 MG tablet  Commonly known as:  NORVASC  Take 1 tablet (5 mg total) by mouth daily.     aspirin EC 81 MG tablet  Take 81 mg by mouth daily.     atorvastatin 20 MG tablet  Commonly known as:  LIPITOR  Take 20 mg by mouth at bedtime.     cetirizine 10 MG tablet  Commonly known as:  ZYRTEC  Take 10 mg by mouth daily.     cholecalciferol 1000 units tablet  Commonly known as:  VITAMIN D  Take 1,000 Units by mouth daily.     ciprofloxacin 500 MG tablet  Commonly known as:  CIPRO  Take 1 tablet (500 mg total) by mouth 2 (two) times daily.     diclofenac sodium 1 % Gel  Commonly known as:  VOLTAREN  Apply 1 application topically 2 (two) times daily as needed (pain).     DSS 100 MG Caps  Take 200 mg by mouth 2 (two) times daily.     docusate sodium 100 MG capsule  Commonly known as:  COLACE  Take 1 capsule (100 mg total) by mouth 2 (two) times daily.     DULoxetine 60 MG capsule  Commonly known as:  CYMBALTA  Take 60 mg by  mouth daily as needed (pain).     eplerenone 50 MG tablet  Commonly known as:  INSPRA  Take 100 mg by mouth 2 (two) times daily.     eszopiclone 2 MG Tabs tablet  Commonly known as:  LUNESTA  Take 2 mg by mouth at bedtime as needed for sleep. Take immediately before bedtime     gabapentin 100 MG capsule  Commonly known as:  NEURONTIN  Take 100 mg by mouth 3 (three) times daily.     labetalol 200 MG tablet  Commonly known as:  NORMODYNE  Take 1 tablet (200 mg total) by mouth 2 (two) times daily.     LINZESS 290 MCG Caps capsule  Generic drug:  Linaclotide  Take 290 mcg by mouth daily.     meloxicam 15 MG tablet  Commonly known as:  MOBIC  Take 15 mg by mouth daily.     metroNIDAZOLE 500 MG tablet  Commonly known as:  FLAGYL  Take 1 tablet (500 mg total) by mouth every 8 (eight) hours.     omega-3 acid ethyl esters 1 g capsule  Commonly known as:  LOVAZA  Take 1 g by mouth 2 (two) times daily.     omeprazole 40 MG capsule  Commonly known as:  PRILOSEC  Take 40 mg by mouth daily.         Bennie Pierini, RN

## 2016-01-21 ENCOUNTER — Ambulatory Visit: Payer: Self-pay | Admitting: Family Medicine

## 2016-04-17 ENCOUNTER — Encounter (HOSPITAL_BASED_OUTPATIENT_CLINIC_OR_DEPARTMENT_OTHER): Payer: Self-pay | Admitting: *Deleted

## 2016-04-17 ENCOUNTER — Emergency Department (HOSPITAL_BASED_OUTPATIENT_CLINIC_OR_DEPARTMENT_OTHER)
Admission: EM | Admit: 2016-04-17 | Discharge: 2016-04-17 | Disposition: A | Discharge status: Home / Self Care | Payer: Medicare Other | Attending: Emergency Medicine | Admitting: Emergency Medicine

## 2016-04-17 DIAGNOSIS — Z7982 Long term (current) use of aspirin: Secondary | ICD-10-CM | POA: Diagnosis not present

## 2016-04-17 DIAGNOSIS — I4891 Unspecified atrial fibrillation: Secondary | ICD-10-CM | POA: Insufficient documentation

## 2016-04-17 DIAGNOSIS — E871 Hypo-osmolality and hyponatremia: Secondary | ICD-10-CM

## 2016-04-17 DIAGNOSIS — R531 Weakness: Secondary | ICD-10-CM | POA: Diagnosis present

## 2016-04-17 DIAGNOSIS — F329 Major depressive disorder, single episode, unspecified: Secondary | ICD-10-CM | POA: Diagnosis not present

## 2016-04-17 DIAGNOSIS — I129 Hypertensive chronic kidney disease with stage 1 through stage 4 chronic kidney disease, or unspecified chronic kidney disease: Secondary | ICD-10-CM | POA: Insufficient documentation

## 2016-04-17 DIAGNOSIS — N182 Chronic kidney disease, stage 2 (mild): Secondary | ICD-10-CM | POA: Insufficient documentation

## 2016-04-17 DIAGNOSIS — Z79899 Other long term (current) drug therapy: Secondary | ICD-10-CM | POA: Diagnosis not present

## 2016-04-17 DIAGNOSIS — M1990 Unspecified osteoarthritis, unspecified site: Secondary | ICD-10-CM | POA: Insufficient documentation

## 2016-04-17 LAB — CBC WITH DIFFERENTIAL/PLATELET
BASOS ABS: 0 10*3/uL (ref 0.0–0.1)
Basophils Relative: 1 %
EOS PCT: 3 %
Eosinophils Absolute: 0.1 10*3/uL (ref 0.0–0.7)
HEMATOCRIT: 34.7 % — AB (ref 36.0–46.0)
HEMOGLOBIN: 11.9 g/dL — AB (ref 12.0–15.0)
LYMPHS ABS: 1.8 10*3/uL (ref 0.7–4.0)
LYMPHS PCT: 44 %
MCH: 28.3 pg (ref 26.0–34.0)
MCHC: 34.3 g/dL (ref 30.0–36.0)
MCV: 82.6 fL (ref 78.0–100.0)
Monocytes Absolute: 0.4 10*3/uL (ref 0.1–1.0)
Monocytes Relative: 9 %
NEUTROS ABS: 1.7 10*3/uL (ref 1.7–7.7)
Neutrophils Relative %: 43 %
Platelets: 310 10*3/uL (ref 150–400)
RBC: 4.2 MIL/uL (ref 3.87–5.11)
RDW: 13.9 % (ref 11.5–15.5)
WBC: 3.9 10*3/uL — AB (ref 4.0–10.5)

## 2016-04-17 LAB — BASIC METABOLIC PANEL
ANION GAP: 7 (ref 5–15)
BUN: 14 mg/dL (ref 6–20)
CHLORIDE: 99 mmol/L — AB (ref 101–111)
CO2: 25 mmol/L (ref 22–32)
Calcium: 9.8 mg/dL (ref 8.9–10.3)
Creatinine, Ser: 0.95 mg/dL (ref 0.44–1.00)
GFR calc Af Amer: >60 mL/min (ref >60)
GLUCOSE: 120 mg/dL — AB (ref 65–99)
POTASSIUM: 3.5 mmol/L (ref 3.5–5.1)
SODIUM: 131 mmol/L — AB (ref 135–145)

## 2016-04-17 MED ORDER — SODIUM CHLORIDE 0.9 % IV BOLUS (SEPSIS)
1000.0000 mL | Freq: Once | INTRAVENOUS | Status: AC
  Administered 2016-04-17: 1000 mL via INTRAVENOUS

## 2016-04-17 MED ORDER — KETOROLAC TROMETHAMINE 30 MG/ML IJ SOLN
30.0000 mg | Freq: Once | INTRAMUSCULAR | Status: AC
  Administered 2016-04-17: 30 mg via INTRAVENOUS
  Filled 2016-04-17: qty 1

## 2016-04-17 NOTE — Discharge Instructions (Signed)
Reduce your fluid intake for the next several days.  Follow-up with your surgeon as scheduled tomorrow.   Weakness Weakness is a lack of strength. It may be felt all over the body (generalized) or in one specific part of the body (focal). Some causes of weakness can be serious. You may need further medical evaluation, especially if you are elderly or you have a history of immunosuppression (such as chemotherapy or HIV), kidney disease, heart disease, or diabetes. CAUSES  Weakness can be caused by many different things, including:  Infection.  Physical exhaustion.  Internal bleeding or other blood loss that results in a lack of red blood cells (anemia).  Dehydration. This cause is more common in elderly people.  Side effects or electrolyte abnormalities from medicines, such as pain medicines or sedatives.  Emotional distress, anxiety, or depression.  Circulation problems, especially severe peripheral arterial disease.  Heart disease, such as rapid atrial fibrillation, bradycardia, or heart failure.  Nervous system disorders, such as Guillain-Barr syndrome, multiple sclerosis, or stroke. DIAGNOSIS  To find the cause of your weakness, your caregiver will take your history and perform a physical exam. Lab tests or X-rays may also be ordered, if needed. TREATMENT  Treatment of weakness depends on the cause of your symptoms and can vary greatly. HOME CARE INSTRUCTIONS   Rest as needed.  Eat a well-balanced diet.  Try to get some exercise every day.  Only take over-the-counter or prescription medicines as directed by your caregiver. SEEK MEDICAL CARE IF:   Your weakness seems to be getting worse or spreads to other parts of your body.  You develop new aches or pains. SEEK IMMEDIATE MEDICAL CARE IF:   You cannot perform your normal daily activities, such as getting dressed and feeding yourself.  You cannot walk up and down stairs, or you feel exhausted when you do so.  You  have shortness of breath or chest pain.  You have difficulty moving parts of your body.  You have weakness in only one area of the body or on only one side of the body.  You have a fever.  You have trouble speaking or swallowing.  You cannot control your bladder or bowel movements.  You have black or bloody vomit or stools. MAKE SURE YOU:  Understand these instructions.  Will watch your condition.  Will get help right away if you are not doing well or get worse.   This information is not intended to replace advice given to you by your health care provider. Make sure you discuss any questions you have with your health care provider.   Document Released: 11/03/2005 Document Revised: 05/04/2012 Document Reviewed: 01/02/2012 Elsevier Interactive Patient Education 2016 [employer]  Hyponatremia Hyponatremia is when the amount of salt (sodium) in your blood is too low. When sodium levels are low, your cells absorb extra water and they swell. The swelling happens throughout the body, but it mostly affects the brain. CAUSES This condition may be caused by:  Heart, kidney, or liver problems.  Thyroid problems.  Adrenal gland problems.  Metabolic conditions, such as syndrome of inappropriate antidiuretic hormone (SIADH).  Severe vomiting and diarrhea.  Certain medicines or illegal drugs.  Dehydration.  Drinking too much water.  Eating a diet that is low in sodium.  Large burns on your body.  Sweating. RISK FACTORS This condition is more likely to develop in people who:  Have long-term (chronic) kidney disease.  Have heart failure.  Have a medical condition that causes frequent  or excessive diarrhea.  Have metabolic conditions, such as Addison disease or SIADH.  Take certain medicines that affect the sodium and fluid balance in the blood. Some of these medicine types include:  Diuretics.  NSAIDs.  Some opioid pain medicines.  Some  antidepressants.  Some seizure prevention medicines. SYMPTOMS  Symptoms of this condition include:  Nausea and vomiting.  Confusion.  Lethargy.  Agitation.  Headache.  Seizures.  Unconsciousness.  Appetite loss.  Muscle weakness and cramping.  Feeling weak or light-headed.  Having a rapid heart rate.  Fainting, in severe cases. DIAGNOSIS This condition is diagnosed with a medical history and physical exam. You will also have other tests, including:  Blood tests.  Urine tests. TREATMENT Treatment for this condition depends on the cause. Treatment may include:  Fluids given through an IV tube that is inserted into one of your veins.  Medicines to correct the sodium imbalance. If medicines are causing the condition, the medicines will need to be adjusted.  Limiting water or fluid intake to get the correct sodium balance. HOME CARE INSTRUCTIONS  Take medicines only as directed by your health care provider. Many medicines can make this condition worse. Talk with your health care provider about any medicines that you are currently taking.  Carefully follow a recommended diet as directed by your health care provider.  Carefully follow instructions from your health care provider about fluid restrictions.  Keep all follow-up visits as directed by your health care provider. This is important.  Do not drink alcohol. SEEK MEDICAL CARE IF:  You develop worsening nausea, fatigue, headache, confusion, or weakness.  Your symptoms go away and then return.  You have problems following the recommended diet. SEEK IMMEDIATE MEDICAL CARE IF:  You have a seizure.  You faint.  You have ongoing diarrhea or vomiting.   This information is not intended to replace advice given to you by your health care provider. Make sure you discuss any questions you have with your health care provider.   Document Released: 10/24/2002 Document Revised: 03/20/2015 Document Reviewed:  11/23/2014 Elsevier Interactive Patient Education [employer]

## 2016-04-17 NOTE — ED Notes (Signed)
Pt. Is alert and oriented with no distress noted. 

## 2016-04-17 NOTE — ED Notes (Signed)
Generalized weakness. States she just does not feel well. She is talking to a surgeon tomorrow about parathyroid surgery. She feels like her fibromyalgia is flaring up.

## 2016-04-17 NOTE — ED Provider Notes (Signed)
CSN: [id number]     Arrival date & time 04/17/16  1551 History   First MD Initiated Contact with Patient 04/17/16 1613     Chief Complaint  Patient presents with  . Weakness     (Consider location/radiation/quality/duration/timing/severity/associated sxs/prior Treatment) HPI Comments: Patient is a 65 year old female with past medical history of hypertension, fibromyalgia, mitral valve prolapse, and hyperparathyroidism. She presents for evaluation of "not feeling well". She reports feeling weak and tingly and is concerned that her electrolytes are off. She has felt this way in the past when her potassium has been low and she has had other electrolyte abnormalities. She denies any chest pain or shortness of breath. She denies any fevers or chills.  Patient is a 65 y.o. female presenting with weakness. The history is provided by the patient.  Weakness This is a new problem. The current episode started 2 days ago. The problem occurs constantly. The problem has been gradually worsening. Pertinent negatives include no chest pain and no shortness of breath. Nothing aggravates the symptoms. Nothing relieves the symptoms. She has tried nothing for the symptoms. The treatment provided no relief.    Past Medical History  Diagnosis Date  . Fibromyalgia   . Mitral valve prolapse   . Hypertension   . Hyperparathyroidism (HCC)   . GERD (gastroesophageal reflux disease)   . Depression   . High cholesterol   . Atrial fibrillation (HCC)   . Bronchial asthma   . Pneumonia X 2  . Anemia   . H/O hiatal hernia   . Daily headache     "recently" (07/31/2014)  . Arthritis   . Osteopenia   . Chronic back pain   . Anxiety   . Acute renal disease   . Chronic kidney disease (CKD), stage II (mild)   . Diverticulitis    Past Surgical History  Procedure Laterality Date  . Nissen fundoplication  1980's  . Knee arthroscopy Left 1990's  . Cholecystectomy  ~ 2008  . Liver biopsy  1990's    "fatty liver"  .  Tonsillectomy and adenoidectomy  ~ 1960  . Hernia repair    . Vaginal hysterectomy  1980's  . Tubal ligation  1972  . Cardiac catheterization      "several"  . Transurethral resection of bladder tumor with gyrus (turbt-gyrus)  ~ 12/2013   Family History  Problem Relation Age of Onset  . Hypertension Mother   . Stroke Mother   . Diabetes Mother   . Cancer Mother   . Hypertension Father   . Stroke Father   . Lung cancer Father   . Diabetes Sister   . Diabetes Brother   . Pancreatic cancer Paternal Aunt    Social History  Substance Use Topics  . Smoking status: Never Smoker   . Smokeless tobacco: Never Used  . Alcohol Use: No   OB History    No data available     Review of Systems  Respiratory: Negative for shortness of breath.   Cardiovascular: Negative for chest pain.  Neurological: Positive for weakness.  All other systems reviewed and are negative.     Allergies  Augmentin; Codeine; and Tetracyclines & related  Home Medications   Prior to Admission medications   Medication Sig Start Date End Date Taking? Authorizing Provider  albuterol (PROVENTIL HFA;VENTOLIN HFA) 108 (90 BASE) MCG/ACT inhaler Inhale 2 puffs into the lungs 2 (two) times daily as needed for wheezing or shortness of breath.    Historical Provider, MD  amLODipine (NORVASC) 5 MG tablet Take 1 tablet (5 mg total) by mouth daily. 12/21/15   Catarina Hartshornavid Tat, MD  aspirin EC 81 MG tablet Take 81 mg by mouth daily.    Historical Provider, MD  atorvastatin (LIPITOR) 20 MG tablet Take 20 mg by mouth at bedtime.     Historical Provider, MD  cetirizine (ZYRTEC) 10 MG tablet Take 10 mg by mouth daily.    Historical Provider, MD  cholecalciferol (VITAMIN D) 1000 units tablet Take 1,000 Units by mouth daily.    Historical Provider, MD  ciprofloxacin (CIPRO) 500 MG tablet Take 1 tablet (500 mg total) by mouth 2 (two) times daily. 12/21/15   Catarina Hartshornavid Tat, MD  diclofenac sodium (VOLTAREN) 1 % GEL Apply 1 application topically 2  (two) times daily as needed (pain).    Historical Provider, MD  docusate sodium (COLACE) 100 MG capsule Take 1 capsule (100 mg total) by mouth 2 (two) times daily. 12/21/15   Catarina Hartshornavid Tat, MD  docusate sodium 100 MG CAPS Take 200 mg by mouth 2 (two) times daily. 08/08/14   Kathlen ModyVijaya Akula, MD  DULoxetine (CYMBALTA) 60 MG capsule Take 60 mg by mouth daily as needed (pain).     Historical Provider, MD  eplerenone (INSPRA) 50 MG tablet Take 100 mg by mouth 2 (two) times daily. 09/25/15   Historical Provider, MD  eszopiclone (LUNESTA) 2 MG TABS tablet Take 2 mg by mouth at bedtime as needed for sleep. Take immediately before bedtime    Historical Provider, MD  gabapentin (NEURONTIN) 100 MG capsule Take 100 mg by mouth 3 (three) times daily. 11/05/15   Historical Provider, MD  labetalol (NORMODYNE) 200 MG tablet Take 1 tablet (200 mg total) by mouth 2 (two) times daily. 12/21/15 03/20/16  Catarina Hartshornavid Tat, MD  Linaclotide (LINZESS) 290 MCG CAPS capsule Take 290 mcg by mouth daily.    Historical Provider, MD  meloxicam (MOBIC) 15 MG tablet Take 15 mg by mouth daily.    Historical Provider, MD  metroNIDAZOLE (FLAGYL) 500 MG tablet Take 1 tablet (500 mg total) by mouth every 8 (eight) hours. 12/21/15   Catarina Hartshornavid Tat, MD  omega-3 acid ethyl esters (LOVAZA) 1 G capsule Take 1 g by mouth 2 (two) times daily.    Historical Provider, MD  omeprazole (PRILOSEC) 40 MG capsule Take 40 mg by mouth daily.    Historical Provider, MD   BP 147/85 mmHg  Pulse 72  Temp(Src) 99.7 F (37.6 C) (Oral)  Resp 18  Ht 5' 6.5" (1.689 m)  Wt 194 lb (87.998 kg)  BMI 30.85 kg/m2  SpO2 100% Physical Exam  Constitutional: She is oriented to person, place, and time. She appears well-developed and well-nourished. No distress.  HENT:  Head: Normocephalic and atraumatic.  Mouth/Throat: Oropharynx is clear and moist.  Neck: Normal range of motion. Neck supple.  Cardiovascular: Normal rate and regular rhythm.  Exam reveals no gallop and no friction rub.    No murmur heard. Pulmonary/Chest: Effort normal and breath sounds normal. No respiratory distress. She has no wheezes. She has no rales.  Abdominal: Soft. Bowel sounds are normal. She exhibits no distension. There is no tenderness. There is no rebound and no guarding.  Musculoskeletal: Normal range of motion.  Neurological: She is alert and oriented to person, place, and time.  Skin: Skin is warm and dry. She is not diaphoretic.  Nursing note and vitals reviewed.   ED Course  Procedures (including critical care time) Labs Review Labs Reviewed  BASIC  METABOLIC PANEL  CBC WITH DIFFERENTIAL/PLATELET    Imaging Review No results found. I have personally reviewed and evaluated these images and lab results as part of my medical decision-making.    MDM   Final diagnoses:  None    Patient is a 65 year old female who presents with complaints of generalized weakness and feeling "tingly". Has been going on for several days. She has a history of hyperparathyroidism and feels as though one of her electrolytes may be off. She has felt this way in the past with similar electrolyte abnormalities. Her workup reveals only a low sodium of 131. She was given normal saline and appears appropriate for discharge. She is to follow-up tomorrow with her surgeon to discuss whether she is a candidate for surgery or not.    Geoffery Lyons, MD 04/17/16 [phone]

## 2016-06-10 ENCOUNTER — Encounter (HOSPITAL_BASED_OUTPATIENT_CLINIC_OR_DEPARTMENT_OTHER): Payer: Self-pay

## 2016-06-10 ENCOUNTER — Emergency Department (HOSPITAL_BASED_OUTPATIENT_CLINIC_OR_DEPARTMENT_OTHER)
Admission: EM | Admit: 2016-06-10 | Discharge: 2016-06-10 | Disposition: A | Discharge status: Home / Self Care | Payer: Medicare Other | Attending: Emergency Medicine | Admitting: Emergency Medicine

## 2016-06-10 ENCOUNTER — Emergency Department (HOSPITAL_BASED_OUTPATIENT_CLINIC_OR_DEPARTMENT_OTHER): Payer: Medicare Other

## 2016-06-10 DIAGNOSIS — I4891 Unspecified atrial fibrillation: Secondary | ICD-10-CM | POA: Diagnosis not present

## 2016-06-10 DIAGNOSIS — Z79899 Other long term (current) drug therapy: Secondary | ICD-10-CM | POA: Diagnosis not present

## 2016-06-10 DIAGNOSIS — K5732 Diverticulitis of large intestine without perforation or abscess without bleeding: Secondary | ICD-10-CM

## 2016-06-10 DIAGNOSIS — M1990 Unspecified osteoarthritis, unspecified site: Secondary | ICD-10-CM | POA: Insufficient documentation

## 2016-06-10 DIAGNOSIS — F329 Major depressive disorder, single episode, unspecified: Secondary | ICD-10-CM | POA: Insufficient documentation

## 2016-06-10 DIAGNOSIS — N182 Chronic kidney disease, stage 2 (mild): Secondary | ICD-10-CM | POA: Insufficient documentation

## 2016-06-10 DIAGNOSIS — I129 Hypertensive chronic kidney disease with stage 1 through stage 4 chronic kidney disease, or unspecified chronic kidney disease: Secondary | ICD-10-CM | POA: Insufficient documentation

## 2016-06-10 DIAGNOSIS — E213 Hyperparathyroidism, unspecified: Secondary | ICD-10-CM | POA: Insufficient documentation

## 2016-06-10 DIAGNOSIS — Z7951 Long term (current) use of inhaled steroids: Secondary | ICD-10-CM | POA: Diagnosis not present

## 2016-06-10 DIAGNOSIS — Z7982 Long term (current) use of aspirin: Secondary | ICD-10-CM | POA: Insufficient documentation

## 2016-06-10 DIAGNOSIS — R109 Unspecified abdominal pain: Secondary | ICD-10-CM | POA: Diagnosis present

## 2016-06-10 LAB — URINALYSIS, ROUTINE W REFLEX MICROSCOPIC
Bilirubin Urine: NEGATIVE
GLUCOSE, UA: NEGATIVE mg/dL
KETONES UR: NEGATIVE mg/dL
Nitrite: NEGATIVE
PH: 7.5 (ref 5.0–8.0)
Protein, ur: NEGATIVE mg/dL
Specific Gravity, Urine: 1.012 (ref 1.005–1.030)

## 2016-06-10 LAB — LIPASE, BLOOD: LIPASE: 19 U/L (ref 11–51)

## 2016-06-10 LAB — COMPREHENSIVE METABOLIC PANEL
ALBUMIN: 4.5 g/dL (ref 3.5–5.0)
ALT: 25 U/L (ref 14–54)
ANION GAP: 8 (ref 5–15)
AST: 22 U/L (ref 15–41)
Alkaline Phosphatase: 141 U/L — ABNORMAL HIGH (ref 38–126)
BILIRUBIN TOTAL: 1.2 mg/dL (ref 0.3–1.2)
BUN: 9 mg/dL (ref 6–20)
CALCIUM: 9.8 mg/dL (ref 8.9–10.3)
CO2: 29 mmol/L (ref 22–32)
Chloride: 102 mmol/L (ref 101–111)
Creatinine, Ser: 0.82 mg/dL (ref 0.44–1.00)
Glucose, Bld: 100 mg/dL — ABNORMAL HIGH (ref 65–99)
POTASSIUM: 3 mmol/L — AB (ref 3.5–5.1)
Sodium: 139 mmol/L (ref 135–145)
TOTAL PROTEIN: 8.3 g/dL — AB (ref 6.5–8.1)

## 2016-06-10 LAB — CBC WITH DIFFERENTIAL/PLATELET
BASOS PCT: 1 %
Basophils Absolute: 0 10*3/uL (ref 0.0–0.1)
Eosinophils Absolute: 0.1 10*3/uL (ref 0.0–0.7)
Eosinophils Relative: 1 %
HEMATOCRIT: 36.9 % (ref 36.0–46.0)
Hemoglobin: 12.4 g/dL (ref 12.0–15.0)
LYMPHS ABS: 1.2 10*3/uL (ref 0.7–4.0)
Lymphocytes Relative: 20 %
MCH: 28.7 pg (ref 26.0–34.0)
MCHC: 33.6 g/dL (ref 30.0–36.0)
MCV: 85.4 fL (ref 78.0–100.0)
MONO ABS: 0.4 10*3/uL (ref 0.1–1.0)
MONOS PCT: 7 %
NEUTROS ABS: 4.5 10*3/uL (ref 1.7–7.7)
Neutrophils Relative %: 71 %
Platelets: 311 10*3/uL (ref 150–400)
RBC: 4.32 MIL/uL (ref 3.87–5.11)
RDW: 14.5 % (ref 11.5–15.5)
WBC: 6.3 10*3/uL (ref 4.0–10.5)

## 2016-06-10 LAB — URINE MICROSCOPIC-ADD ON

## 2016-06-10 MED ORDER — SODIUM CHLORIDE 0.9 % IV BOLUS (SEPSIS)
1000.0000 mL | Freq: Once | INTRAVENOUS | Status: AC
  Administered 2016-06-10: 1000 mL via INTRAVENOUS

## 2016-06-10 MED ORDER — ONDANSETRON HCL 4 MG/2ML IJ SOLN
4.0000 mg | Freq: Once | INTRAMUSCULAR | Status: AC
  Administered 2016-06-10: 4 mg via INTRAVENOUS
  Filled 2016-06-10: qty 2

## 2016-06-10 MED ORDER — METRONIDAZOLE IN NACL 5-0.79 MG/ML-% IV SOLN
500.0000 mg | Freq: Once | INTRAVENOUS | Status: AC
  Administered 2016-06-10: 500 mg via INTRAVENOUS
  Filled 2016-06-10: qty 100

## 2016-06-10 MED ORDER — IOPAMIDOL (ISOVUE-300) INJECTION 61%
100.0000 mL | Freq: Once | INTRAVENOUS | Status: AC | PRN
  Administered 2016-06-10: 100 mL via INTRAVENOUS

## 2016-06-10 MED ORDER — MORPHINE SULFATE (PF) 4 MG/ML IV SOLN
4.0000 mg | Freq: Once | INTRAVENOUS | Status: AC
  Administered 2016-06-10: 4 mg via INTRAVENOUS
  Filled 2016-06-10: qty 1

## 2016-06-10 MED ORDER — METRONIDAZOLE 500 MG PO TABS: 500.0000 mg | ORAL_TABLET | Freq: Two times a day (BID) | ORAL | 0 refills | Status: DC

## 2016-06-10 MED ORDER — HYDROCODONE-ACETAMINOPHEN 5-325 MG PO TABS: 1.0000 | ORAL_TABLET | Freq: Four times a day (QID) | ORAL | 0 refills | Status: DC | PRN

## 2016-06-10 MED ORDER — CIPROFLOXACIN IN D5W 400 MG/200ML IV SOLN
400.0000 mg | Freq: Once | INTRAVENOUS | Status: AC
  Administered 2016-06-10: 400 mg via INTRAVENOUS
  Filled 2016-06-10: qty 200

## 2016-06-10 MED ORDER — CIPROFLOXACIN HCL 500 MG PO TABS: 500.0000 mg | ORAL_TABLET | Freq: Two times a day (BID) | ORAL | 0 refills | Status: DC

## 2016-06-10 MED ORDER — HYDROMORPHONE HCL 1 MG/ML IJ SOLN
1.0000 mg | Freq: Once | INTRAMUSCULAR | Status: AC
Start: 2016-06-10 — End: 2016-06-10
  Administered 2016-06-10: 1 mg via INTRAVENOUS
  Filled 2016-06-10: qty 1

## 2016-06-10 MED FILL — HYDROCODON-APAP 5-325: 5-325 | 2 days supply | Qty: 15 | Fill #0

## 2016-06-10 MED FILL — metroNIDAZOLE 500 MG TABS: 500 | 10 days supply | Qty: 20 | Fill #0

## 2016-06-10 MED FILL — CIPROFLOXACIN HCL 500 MG TA: 500 | 10 days supply | Qty: 20 | Fill #0

## 2016-06-10 NOTE — ED Provider Notes (Signed)
Care taken over by Dr. Wilkie Aye at 7:30 AM. Patient's CT scan shows acute diverticulitis. She was started on Cipro, Flagyl and pain control. Patient was instructed to follow-up with her GI doctor at [facility] who she will call later today and most likely get surgical referral from there.  CT Abdomen Pelvis W Contrast (Final result)  Result time 06/10/16 09:42:34  Final result by Charlett Nose, MD (06/10/16 09:42:34)           Narrative:   CLINICAL DATA: Left lower abdominal pain. Right suprapubic pain. Symptoms since Saturday. Fever since Monday. EXAM: CT ABDOMEN AND PELVIS WITH CONTRAST TECHNIQUE: Multidetector CT imaging of the abdomen and pelvis was performed using the standard protocol following bolus administration of intravenous contrast. CONTRAST: ISOVUE-300 IOPAMIDOL (ISOVUE-300) INJECTION 61% COMPARISON: 12/17/2015 FINDINGS: Lower chest: Lung bases are clear. No effusions. Heart is normal size. Hepatobiliary: Multiple hepatic cysts. Prior cholecystectomy. No biliary duct dilatation. Pancreas: No focal abnormality or ductal dilatation. Spleen: No focal abnormality. Normal size. Adrenals/Urinary Tract: Areas of cortical thinning and scarring in the kidneys bilaterally, left greater than right. No renal mass. Adrenal glands and urinary bladder unremarkable. Stomach/Bowel: Descending colonic and sigmoid diverticulosis. Inflammatory changes around the sigmoid colon with wall thickening compatible with active diverticulitis. Stomach and small bowel decompressed. Vascular/Lymphatic: No evidence of aneurysm or adenopathy. Reproductive: Prior hysterectomy. No adnexal masses. Other: No free fluid or free air. Musculoskeletal: Degenerative disc and facet disease at L5-S1 with grade 1 anterolisthesis. No acute bony abnormality. IMPRESSION: Left colonic diverticulosis. Wall thickening and surrounding inflammatory change in the sigmoid colon, most compatible with active  diverticulitis. Colon cancer could have a similar appearance. Recommend correlation with recent colonoscopy or follow-up with colonoscopy if there has not been a recent one after treatment and resolution of the acute inflammatory process. Electronically Signed By: Charlett Nose M.D. On: 06/10/2016 09:42            Gwyneth Sprout, MD 06/10/16 1027

## 2016-06-10 NOTE — ED Provider Notes (Signed)
[facility] Provider Note   CSN: [id number] Arrival date & time: 06/10/16  2130  First Provider Contact:  First MD Initiated Contact with Patient 06/10/16 [phone]        History   Chief Complaint Chief Complaint  Patient presents with  . Abdominal Pain    HPI Christina Vang is a 65 y.o. female.  HPI  This is a 65 year old female with a history of diverticulitis, IBS, atrial fibrillation, hyperparathyroidism, hypertension who presents with abdominal pain. Patient reports onset of symptoms on Saturday. Mostly in the left lower quadrant but have migrated to the suprapubic region. She reports sharp pains that wax and wane and intensity. Current pain is "15 out of 10." She reports pressure in the suprapubic region. Denies any dysuria or hematuria. States that she's had multiple episodes of diverticulitis which are somewhat similar but the radiation of the pain to the super pubic region is atypical. She initially had diarrhea but now states she feels more constipated. Denies vomiting. Reports fever last night to 101.6. Denies back pain. She took 1000 mg of Tylenol last night for her fever. This did not seem to help her pain.  Past Medical History:  Diagnosis Date  . Acute renal disease   . Anemia   . Anxiety   . Arthritis   . Atrial fibrillation (HCC)   . Bronchial asthma   . Chronic back pain   . Chronic kidney disease (CKD), stage II (mild)   . Daily headache    "recently" (07/31/2014)  . Depression   . Diverticulitis   . Fibromyalgia   . GERD (gastroesophageal reflux disease)   . H/O hiatal hernia   . High cholesterol   . Hyperparathyroidism (HCC)   . Hypertension   . Mitral valve prolapse   . Osteopenia   . Pneumonia X 2    Patient Active Problem List   Diagnosis Date Noted  . Sepsis (HCC) 12/18/2015  . Normocytic anemia 12/18/2015  . Acute diverticulitis 12/17/2015  . Overdose 07/31/2014  . Dizziness 08/19/2013  . Hypertensive urgency 08/19/2013  .  Fibromyalgia   . Hyperparathyroidism (HCC)   . GERD (gastroesophageal reflux disease)   . Hyperthyroidism without goitre-TSH 0.22 11/16/2011  . Steatosis of liver-per [facility] 12.30 11/16/2011  . Hypercalcemia 11/15/2011  . Hypokalemia 11/15/2011  . Headache(784.0) 11/15/2011  . HTN (hypertension) 11/15/2011    Past Surgical History:  Procedure Laterality Date  . CARDIAC CATHETERIZATION     "several"  . CHOLECYSTECTOMY  ~ 2008  . HERNIA REPAIR    . KNEE ARTHROSCOPY Left 1990's  . LIVER BIOPSY  1990's   "fatty liver"  . NISSEN FUNDOPLICATION  1980's  . TONSILLECTOMY AND ADENOIDECTOMY  ~ 1960  . 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 TUBAL LIGATION  1972  . VAGINAL HYSTERECTOMY  1980's    OB History    No data available       Home Medications    Prior to Admission medications   Medication Sig Start Date End Date Taking? Authorizing Provider  albuterol (PROVENTIL HFA;VENTOLIN HFA) 108 (90 BASE) MCG/ACT inhaler Inhale 2 puffs into the lungs 2 (two) times daily as needed for wheezing or shortness of breath.   Yes Historical Provider, MD  amLODipine (NORVASC) 5 MG tablet Take 1 tablet (5 mg total) by mouth daily. Patient taking differently: Take 5 mg by mouth 2 (two) times daily.  12/21/15  Yes Catarina Hartshorn, MD  aspirin EC 81  MG tablet Take 81 mg by mouth daily.   Yes Historical Provider, MD  atorvastatin (LIPITOR) 20 MG tablet Take 20 mg by mouth at bedtime.    Yes Historical Provider, MD  cetirizine (ZYRTEC) 10 MG tablet Take 10 mg by mouth daily.   Yes Historical Provider, MD  eszopiclone (LUNESTA) 2 MG TABS tablet Take 2 mg by mouth at bedtime as needed for sleep. Take immediately before bedtime   Yes Historical Provider, MD  gabapentin (NEURONTIN) 100 MG capsule Take 600 mg by mouth at bedtime.  11/05/15  Yes Historical Provider, MD  labetalol (NORMODYNE) 200 MG tablet Take 1 tablet (200 mg total) by mouth 2 (two) times daily. 12/21/15 06/10/16  Yes Catarina Hartshorn, MD  Linaclotide Karlene Einstein) 290 MCG CAPS capsule Take 290 mcg by mouth daily.   Yes Historical Provider, MD  omeprazole (PRILOSEC) 40 MG capsule Take 40 mg by mouth daily.   Yes Historical Provider, MD  cholecalciferol (VITAMIN D) 1000 units tablet Take 1,000 Units by mouth daily.    Historical Provider, MD  ciprofloxacin (CIPRO) 500 MG tablet Take 1 tablet (500 mg total) by mouth 2 (two) times daily. 06/10/16   Gwyneth Sprout, MD  diclofenac sodium (VOLTAREN) 1 % GEL Apply 1 application topically 2 (two) times daily as needed (pain).    Historical Provider, MD  DULoxetine (CYMBALTA) 60 MG capsule Take 60 mg by mouth daily as needed (pain).     Historical Provider, MD  eplerenone (INSPRA) 50 MG tablet Take 100 mg by mouth 2 (two) times daily. 09/25/15   Historical Provider, MD  HYDROcodone-acetaminophen (NORCO/VICODIN) 5-325 MG tablet Take 1-2 tablets by mouth every 6 (six) hours as needed. 06/10/16   Gwyneth Sprout, MD  meloxicam (MOBIC) 15 MG tablet Take 15 mg by mouth daily.    Historical Provider, MD  metroNIDAZOLE (FLAGYL) 500 MG tablet Take 1 tablet (500 mg total) by mouth 2 (two) times daily. 06/10/16   Gwyneth Sprout, MD  omega-3 acid ethyl esters (LOVAZA) 1 G capsule Take 1 g by mouth 2 (two) times daily.    Historical Provider, MD    Family History Family History  Problem Relation Age of Onset  . Hypertension Mother   . Stroke Mother   . Diabetes Mother   . Cancer Mother   . Hypertension Father   . Stroke Father   . Lung cancer Father   . Diabetes Sister   . Diabetes Brother   . Pancreatic cancer Paternal Aunt     Social History Social History  Substance Use Topics  . Smoking status: Never Smoker  . Smokeless tobacco: Never Used  . Alcohol use No     Allergies   Augmentin [amoxicillin-pot clavulanate]; Codeine; and Tetracyclines & related   Review of Systems Review of Systems  Constitutional: Positive for fever.  Respiratory: Negative for  shortness of breath.   Cardiovascular: Negative for chest pain.  Gastrointestinal: Positive for abdominal pain, constipation and diarrhea. Negative for blood in stool, nausea and vomiting.  Genitourinary: Negative for dysuria and frequency.  Neurological: Negative for weakness.  All other systems reviewed and are negative.    Physical Exam Updated Vital Signs BP 139/83 (BP Location: Right Arm)   Pulse 90   Temp 98.7 F (37.1 C) (Oral)   Resp 18   Ht 5\' 6"  (1.676 m)   Wt 192 lb (87.1 kg)   SpO2 100%   BMI 30.99 kg/m   Physical Exam  Constitutional: She is oriented to person, place,  and time. She appears well-developed and well-nourished.  Overweight  HENT:  Head: Normocephalic and atraumatic.  Cardiovascular: Normal rate, regular rhythm and normal heart sounds.   No murmur heard. Pulmonary/Chest: Effort normal and breath sounds normal. No respiratory distress. She has no wheezes.  Abdominal: Soft. Bowel sounds are normal. There is tenderness. There is no rebound. No hernia.  Tenderness to palpation left lower quadrant and suprapubic region, voluntary guarding  Neurological: She is alert and oriented to person, place, and time.  Skin: Skin is warm and dry.  Psychiatric: She has a normal mood and affect.  Nursing note and vitals reviewed.    ED Treatments / Results  Labs (all labs ordered are listed, but only abnormal results are displayed) Labs Reviewed  URINALYSIS, ROUTINE W REFLEX MICROSCOPIC (NOT AT [facility]) - Abnormal; Notable for the following:       Result Value   APPearance CLOUDY (*)    Hgb urine dipstick SMALL (*)    Leukocytes, UA SMALL (*)    All other components within normal limits  COMPREHENSIVE METABOLIC PANEL - Abnormal; Notable for the following:    Potassium 3.0 (*)    Glucose, Bld 100 (*)    Total Protein 8.3 (*)    Alkaline Phosphatase 141 (*)    All other components within normal limits  URINE MICROSCOPIC-ADD ON - Abnormal; Notable for the  following:    Squamous Epithelial / LPF 0-5 (*)    Bacteria, UA RARE (*)    All other components within normal limits  CBC WITH DIFFERENTIAL/PLATELET  LIPASE, BLOOD    EKG  EKG Interpretation None       Radiology Ct Abdomen Pelvis W Contrast  Result Date: 06/10/2016 CLINICAL DATA:  Left lower abdominal pain. Right suprapubic pain. Symptoms since Saturday. Fever since Monday. EXAM: CT ABDOMEN AND PELVIS WITH CONTRAST TECHNIQUE: Multidetector CT imaging of the abdomen and pelvis was performed using the standard protocol following bolus administration of intravenous contrast. CONTRAST:  ISOVUE-300 IOPAMIDOL (ISOVUE-300) INJECTION 61% COMPARISON:  12/17/2015 FINDINGS: Lower chest: Lung bases are clear. No effusions. Heart is normal size. Hepatobiliary: Multiple hepatic cysts. Prior cholecystectomy. No biliary duct dilatation. Pancreas: No focal abnormality or ductal dilatation. Spleen: No focal abnormality.  Normal size. Adrenals/Urinary Tract: Areas of cortical thinning and scarring in the kidneys bilaterally, left greater than right. No renal mass. Adrenal glands and urinary bladder unremarkable. Stomach/Bowel: Descending colonic and sigmoid diverticulosis. Inflammatory changes around the sigmoid colon with wall thickening compatible with active diverticulitis. Stomach and small bowel decompressed. Vascular/Lymphatic: No evidence of aneurysm or adenopathy. Reproductive: Prior hysterectomy.  No adnexal masses. Other: No free fluid or free air. Musculoskeletal: Degenerative disc and facet disease at L5-S1 with grade 1 anterolisthesis. No acute bony abnormality. IMPRESSION: Left colonic diverticulosis. Wall thickening and surrounding inflammatory change in the sigmoid colon, most compatible with active diverticulitis. Colon cancer could have a similar appearance. Recommend correlation with recent colonoscopy or follow-up with colonoscopy if there has not been a recent one after treatment and  resolution of the acute inflammatory process. Electronically Signed   By: Charlett Nose M.D.   On: 06/10/2016 09:42   Procedures Procedures (including critical care time)  Medications Ordered in ED Medications  sodium chloride 0.9 % bolus 1,000 mL (0 mLs Intravenous Stopped 06/10/16 1047)  ondansetron (ZOFRAN) injection 4 mg (4 mg Intravenous Given 06/10/16 0710)  morphine 4 MG/ML injection 4 mg (4 mg Intravenous Given 06/10/16 0711)  ciprofloxacin (CIPRO) IVPB 400 mg (0 mg  Intravenous Stopped 06/10/16 0840)  metroNIDAZOLE (FLAGYL) IVPB 500 mg (0 mg Intravenous Stopped 06/10/16 1010)  HYDROmorphone (DILAUDID) injection 1 mg (1 mg Intravenous Given 06/10/16 0824)  iopamidol (ISOVUE-300) 61 % injection 100 mL (100 mLs Intravenous Contrast Given 06/10/16 0918)     Initial Impression / Assessment and Plan / ED Course  I have reviewed the triage vital signs and the nursing notes.  Pertinent labs & imaging results that were available during my care of the patient were reviewed by me and considered in my medical decision making (see chart for details).  Clinical Course    Patient presents with abdominal pain. Somewhat consistent with prior episodes of diverticulitis. Also reports fever at home. Afebrile here. Nontoxic. Vital signs reassuring. Tender on exam without signs of peritonitis. Patient was given pain and nausea medication as well as fluids. She was also given antibiotics empirically for presumed diverticulitis. No significant leukocytosis. Given age and history, will obtain CT scan. CT scan pending at time of signout.  Final Clinical Impressions(s) / ED Diagnoses   Final diagnoses:  Diverticulitis of large intestine without perforation or abscess without bleeding    New Prescriptions Discharge Medication List as of 06/10/2016 10:00 AM    START taking these medications   Details  HYDROcodone-acetaminophen (NORCO/VICODIN) 5-325 MG tablet Take 1-2 tablets by mouth every 6 (six) hours as  needed., Starting Tue 06/10/2016, Print         Shon Baton, MD 06/10/16 2302

## 2016-06-10 NOTE — ED Triage Notes (Signed)
Pt c/o left lower abdominal pain and suprapubic pain since Saturday with a fever at home and nausea tonight.  Pt took two extra strength tylenol at 2300 last night to help with the fever and pain.  Pt appeared to be able to ambulate without apparent difficulty, but states she is having more pain and pressure when she lies back on the bed.

## 2016-08-13 ENCOUNTER — Emergency Department (HOSPITAL_BASED_OUTPATIENT_CLINIC_OR_DEPARTMENT_OTHER)
Admission: EM | Admit: 2016-08-13 | Discharge: 2016-08-13 | Disposition: A | Discharge status: Home / Self Care | Payer: Medicare Other | Attending: Emergency Medicine | Admitting: Emergency Medicine

## 2016-08-13 ENCOUNTER — Encounter (HOSPITAL_BASED_OUTPATIENT_CLINIC_OR_DEPARTMENT_OTHER): Payer: Self-pay

## 2016-08-13 DIAGNOSIS — R197 Diarrhea, unspecified: Secondary | ICD-10-CM | POA: Diagnosis not present

## 2016-08-13 DIAGNOSIS — R112 Nausea with vomiting, unspecified: Secondary | ICD-10-CM | POA: Insufficient documentation

## 2016-08-13 DIAGNOSIS — R109 Unspecified abdominal pain: Secondary | ICD-10-CM | POA: Insufficient documentation

## 2016-08-13 DIAGNOSIS — Z7982 Long term (current) use of aspirin: Secondary | ICD-10-CM | POA: Diagnosis not present

## 2016-08-13 DIAGNOSIS — N182 Chronic kidney disease, stage 2 (mild): Secondary | ICD-10-CM | POA: Insufficient documentation

## 2016-08-13 DIAGNOSIS — I129 Hypertensive chronic kidney disease with stage 1 through stage 4 chronic kidney disease, or unspecified chronic kidney disease: Secondary | ICD-10-CM | POA: Insufficient documentation

## 2016-08-13 DIAGNOSIS — Z5321 Procedure and treatment not carried out due to patient leaving prior to being seen by health care provider: Secondary | ICD-10-CM | POA: Diagnosis not present

## 2016-08-13 DIAGNOSIS — J45909 Unspecified asthma, uncomplicated: Secondary | ICD-10-CM | POA: Diagnosis not present

## 2016-08-13 NOTE — ED Notes (Signed)
Pt called to room. Pt states that she does not need to bee seen, her PCP will call in some medications for her.

## 2016-08-13 NOTE — ED Triage Notes (Signed)
C/o abd pain n/v/d and constipation-reports feels like diverticulitis-is awaiting surgery to remove partial intestine-NAD-steady gait

## 2016-08-18 ENCOUNTER — Emergency Department (HOSPITAL_BASED_OUTPATIENT_CLINIC_OR_DEPARTMENT_OTHER)
Admission: EM | Admit: 2016-08-18 | Discharge: 2016-08-18 | Disposition: A | Discharge status: Home / Self Care | Payer: Medicare Other | Attending: Emergency Medicine | Admitting: Emergency Medicine

## 2016-08-18 ENCOUNTER — Encounter (HOSPITAL_BASED_OUTPATIENT_CLINIC_OR_DEPARTMENT_OTHER): Payer: Self-pay | Admitting: Emergency Medicine

## 2016-08-18 ENCOUNTER — Emergency Department (HOSPITAL_BASED_OUTPATIENT_CLINIC_OR_DEPARTMENT_OTHER): Payer: Medicare Other

## 2016-08-18 DIAGNOSIS — N183 Chronic kidney disease, stage 3 (moderate): Secondary | ICD-10-CM | POA: Insufficient documentation

## 2016-08-18 DIAGNOSIS — Z79899 Other long term (current) drug therapy: Secondary | ICD-10-CM | POA: Insufficient documentation

## 2016-08-18 DIAGNOSIS — I129 Hypertensive chronic kidney disease with stage 1 through stage 4 chronic kidney disease, or unspecified chronic kidney disease: Secondary | ICD-10-CM | POA: Diagnosis not present

## 2016-08-18 DIAGNOSIS — Z7982 Long term (current) use of aspirin: Secondary | ICD-10-CM | POA: Insufficient documentation

## 2016-08-18 DIAGNOSIS — R1032 Left lower quadrant pain: Secondary | ICD-10-CM | POA: Diagnosis not present

## 2016-08-18 DIAGNOSIS — R109 Unspecified abdominal pain: Secondary | ICD-10-CM

## 2016-08-18 LAB — COMPREHENSIVE METABOLIC PANEL
ALBUMIN: 4.5 g/dL (ref 3.5–5.0)
ALT: 29 U/L (ref 14–54)
AST: 19 U/L (ref 15–41)
Alkaline Phosphatase: 129 U/L — ABNORMAL HIGH (ref 38–126)
Anion gap: 9 (ref 5–15)
BILIRUBIN TOTAL: 0.8 mg/dL (ref 0.3–1.2)
BUN: 8 mg/dL (ref 6–20)
CALCIUM: 10 mg/dL (ref 8.9–10.3)
CO2: 25 mmol/L (ref 22–32)
CREATININE: 0.8 mg/dL (ref 0.44–1.00)
Chloride: 103 mmol/L (ref 101–111)
GFR calc Af Amer: >60 mL/min (ref >60)
Glucose, Bld: 99 mg/dL (ref 65–99)
POTASSIUM: 3.5 mmol/L (ref 3.5–5.1)
Sodium: 137 mmol/L (ref 135–145)
TOTAL PROTEIN: 8.2 g/dL — AB (ref 6.5–8.1)

## 2016-08-18 LAB — URINALYSIS, ROUTINE W REFLEX MICROSCOPIC
BILIRUBIN URINE: NEGATIVE
Glucose, UA: NEGATIVE mg/dL
HGB URINE DIPSTICK: NEGATIVE
KETONES UR: NEGATIVE mg/dL
Nitrite: NEGATIVE
Protein, ur: NEGATIVE mg/dL
SPECIFIC GRAVITY, URINE: 1.014 (ref 1.005–1.030)
pH: 6.5 (ref 5.0–8.0)

## 2016-08-18 LAB — CBC WITH DIFFERENTIAL/PLATELET
Basophils Absolute: 0 10*3/uL (ref 0.0–0.1)
Basophils Relative: 1 %
EOS ABS: 0.1 10*3/uL (ref 0.0–0.7)
EOS PCT: 2 %
HCT: 37.7 % (ref 36.0–46.0)
Hemoglobin: 12.8 g/dL (ref 12.0–15.0)
LYMPHS ABS: 1.3 10*3/uL (ref 0.7–4.0)
Lymphocytes Relative: 19 %
MCH: 28.8 pg (ref 26.0–34.0)
MCHC: 34 g/dL (ref 30.0–36.0)
MCV: 84.7 fL (ref 78.0–100.0)
MONO ABS: 0.5 10*3/uL (ref 0.1–1.0)
MONOS PCT: 7 %
Neutro Abs: 5.1 10*3/uL (ref 1.7–7.7)
Neutrophils Relative %: 71 %
PLATELETS: 251 10*3/uL (ref 150–400)
RBC: 4.45 MIL/uL (ref 3.87–5.11)
RDW: 13.9 % (ref 11.5–15.5)
WBC: 7 10*3/uL (ref 4.0–10.5)

## 2016-08-18 LAB — URINE MICROSCOPIC-ADD ON

## 2016-08-18 MED ORDER — MORPHINE SULFATE (PF) 4 MG/ML IV SOLN
4.0000 mg | Freq: Once | INTRAVENOUS | Status: AC
  Administered 2016-08-18: 4 mg via INTRAVENOUS
  Filled 2016-08-18 (×2): qty 1

## 2016-08-18 MED ORDER — SODIUM CHLORIDE 0.9 % IV BOLUS (SEPSIS)
1000.0000 mL | Freq: Once | INTRAVENOUS | Status: AC
  Administered 2016-08-18: 1000 mL via INTRAVENOUS

## 2016-08-18 MED ORDER — HYDROCODONE-ACETAMINOPHEN 5-325 MG PO TABS: 1.0000 | ORAL_TABLET | Freq: Four times a day (QID) | ORAL | 0 refills | Status: AC | PRN

## 2016-08-18 MED ORDER — MORPHINE SULFATE (PF) 4 MG/ML IV SOLN
4.0000 mg | Freq: Once | INTRAVENOUS | Status: AC
  Administered 2016-08-18: 4 mg via INTRAVENOUS
  Filled 2016-08-18: qty 1

## 2016-08-18 MED ORDER — PROMETHAZINE HCL 25 MG PO TABS: 25.0000 mg | ORAL_TABLET | Freq: Four times a day (QID) | ORAL | 0 refills | Status: AC | PRN

## 2016-08-18 MED ORDER — METRONIDAZOLE 500 MG PO TABS: 500.0000 mg | ORAL_TABLET | Freq: Three times a day (TID) | ORAL | 0 refills | Status: DC

## 2016-08-18 MED ORDER — ONDANSETRON HCL 4 MG/2ML IJ SOLN
4.0000 mg | Freq: Once | INTRAMUSCULAR | Status: AC
  Administered 2016-08-18: 4 mg via INTRAVENOUS
  Filled 2016-08-18: qty 2

## 2016-08-18 MED ORDER — CIPROFLOXACIN HCL 500 MG PO TABS: 500.0000 mg | ORAL_TABLET | Freq: Two times a day (BID) | ORAL | 0 refills | Status: DC

## 2016-08-18 MED FILL — PROMETHAZINE 25 MG TABLET: 25 | 3 days supply | Qty: 10 | Fill #0

## 2016-08-18 MED FILL — metroNIDAZOLE 500 MG TABS: 500 | 10 days supply | Qty: 30 | Fill #0

## 2016-08-18 MED FILL — HYDROCODON-APAP 5-325: 5-325 | 4 days supply | Qty: 15 | Fill #0

## 2016-08-18 MED FILL — CIPROFLOXACIN HCL 500 MG TA: 500 | 10 days supply | Qty: 20 | Fill #0

## 2016-08-18 NOTE — ED Triage Notes (Signed)
Pt c/o abd pain since early this a.m. She has had a combination of constipation and loose stools recently and now reports bright red blood in stool. Hx of diverticulitis.

## 2016-08-18 NOTE — Discharge Instructions (Signed)
Start cipro and flagyl as prescribed. Take norco for severe pain. Take phenergan for nausea. Follow up with your doctor or your surgeon. Return if worsening.

## 2016-08-18 NOTE — ED Provider Notes (Signed)
[facility] Provider Note   CSN: [id number] Arrival date & time: 08/18/16  0907     History   Chief Complaint Chief Complaint  Patient presents with  . Abdominal Pain    HPI Christina Vang is a 65 y.o. female.  HPI Christina Vang is a 65 y.o. female with hx of afib, CKD, anemia, depression, diverticulitis, presents to ED with complaint of abdominal pian. Pt states pain started early this morning. Reports LLQ pain and bright red blood per rectum. Pt reports similar pain and symptoms over last few months. States has been diagnosed with diverticulitis. Last flare 1 month ago, had CT then at [facility], treated with antibiotics. Has not had any antibiotics since. Reports some pain 5 days ago, states came to ED but left because felt better. Denies nausea or vomiting. Denies urinary symptoms. No fever or chills. Pt has apt with GI in 3 weeks.   Past Medical History:  Diagnosis Date  . Acute renal disease   . Anemia   . Anxiety   . Arthritis   . Atrial fibrillation (HCC)   . Bronchial asthma   . Chronic back pain   . Chronic kidney disease (CKD), stage II (mild)   . Daily headache    "recently" (07/31/2014)  . Depression   . Diverticulitis   . Fibromyalgia   . GERD (gastroesophageal reflux disease)   . H/O hiatal hernia   . High cholesterol   . Hyperparathyroidism (HCC)   . Hypertension   . Mitral valve prolapse   . Osteopenia   . Pneumonia X 2    Patient Active Problem List   Diagnosis Date Noted  . Sepsis (HCC) 12/18/2015  . Normocytic anemia 12/18/2015  . Acute diverticulitis 12/17/2015  . Overdose 07/31/2014  . Dizziness 08/19/2013  . Hypertensive urgency 08/19/2013  . Fibromyalgia   . Hyperparathyroidism (HCC)   . GERD (gastroesophageal reflux disease)   . Hyperthyroidism without goitre-TSH 0.22 11/16/2011  . Steatosis of liver-per [facility] 12.30 11/16/2011  . Hypercalcemia 11/15/2011  . Hypokalemia 11/15/2011  . Headache(784.0) 11/15/2011  .  HTN (hypertension) 11/15/2011    Past Surgical History:  Procedure Laterality Date  . CARDIAC CATHETERIZATION     "several"  . CHOLECYSTECTOMY  ~ 2008  . HERNIA REPAIR    . KNEE ARTHROSCOPY Left 1990's  . LIVER BIOPSY  1990's   "fatty liver"  . NISSEN FUNDOPLICATION  1980's  . TONSILLECTOMY AND ADENOIDECTOMY  ~ 1960  . 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 TUBAL LIGATION  1972  . VAGINAL HYSTERECTOMY  1980's    OB History    No data available       Home Medications    Prior to Admission medications   Medication Sig Start Date End Date Taking? Authorizing Provider  albuterol (PROVENTIL HFA;VENTOLIN HFA) 108 (90 BASE) MCG/ACT inhaler Inhale 2 puffs into the lungs 2 (two) times daily as needed for wheezing or shortness of breath.    Historical Provider, MD  amLODipine (NORVASC) 5 MG tablet Take 1 tablet (5 mg total) by mouth daily. Patient taking differently: Take 5 mg by mouth 2 (two) times daily.  12/21/15   Catarina Hartshorn, MD  aspirin EC 81 MG tablet Take 81 mg by mouth daily.    Historical Provider, MD  atorvastatin (LIPITOR) 20 MG tablet Take 20 mg by mouth at bedtime.     Historical Provider, MD  cetirizine (ZYRTEC) 10 MG tablet Take 10  mg by mouth daily.    Historical Provider, MD  cholecalciferol (VITAMIN D) 1000 units tablet Take 1,000 Units by mouth daily.    Historical Provider, MD  ciprofloxacin (CIPRO) 500 MG tablet Take 1 tablet (500 mg total) by mouth 2 (two) times daily. 06/10/16   Gwyneth Sprout, MD  diclofenac sodium (VOLTAREN) 1 % GEL Apply 1 application topically 2 (two) times daily as needed (pain).    Historical Provider, MD  DULoxetine (CYMBALTA) 60 MG capsule Take 60 mg by mouth daily as needed (pain).     Historical Provider, MD  eplerenone (INSPRA) 50 MG tablet Take 100 mg by mouth 2 (two) times daily. 09/25/15   Historical Provider, MD  eszopiclone (LUNESTA) 2 MG TABS tablet Take 2 mg by mouth at bedtime as needed for  sleep. Take immediately before bedtime    Historical Provider, MD  gabapentin (NEURONTIN) 100 MG capsule Take 600 mg by mouth at bedtime.  11/05/15   Historical Provider, MD  HYDROcodone-acetaminophen (NORCO/VICODIN) 5-325 MG tablet Take 1-2 tablets by mouth every 6 (six) hours as needed. 06/10/16   Gwyneth Sprout, MD  labetalol (NORMODYNE) 200 MG tablet Take 1 tablet (200 mg total) by mouth 2 (two) times daily. 12/21/15 06/10/16  Catarina Hartshorn, MD  Linaclotide (LINZESS) 290 MCG CAPS capsule Take 290 mcg by mouth daily.    Historical Provider, MD  meloxicam (MOBIC) 15 MG tablet Take 15 mg by mouth daily.    Historical Provider, MD  metroNIDAZOLE (FLAGYL) 500 MG tablet Take 1 tablet (500 mg total) by mouth 2 (two) times daily. 06/10/16   Gwyneth Sprout, MD  omega-3 acid ethyl esters (LOVAZA) 1 G capsule Take 1 g by mouth 2 (two) times daily.    Historical Provider, MD  omeprazole (PRILOSEC) 40 MG capsule Take 40 mg by mouth daily.    Historical Provider, MD    Family History Family History  Problem Relation Age of Onset  . Hypertension Mother   . Stroke Mother   . Diabetes Mother   . Cancer Mother   . Hypertension Father   . Stroke Father   . Lung cancer Father   . Diabetes Sister   . Diabetes Brother   . Pancreatic cancer Paternal Aunt     Social History Social History  Substance Use Topics  . Smoking status: Never Smoker  . Smokeless tobacco: Never Used  . Alcohol use No     Allergies   Augmentin [amoxicillin-pot clavulanate]; Codeine; and Tetracyclines & related   Review of Systems Review of Systems  Constitutional: Negative for chills and fever.  Respiratory: Negative for cough, chest tightness and shortness of breath.   Cardiovascular: Negative for chest pain, palpitations and leg swelling.  Gastrointestinal: Positive for abdominal pain and blood in stool. Negative for diarrhea, nausea and vomiting.  Genitourinary: Negative for dysuria, flank pain and pelvic pain.    Musculoskeletal: Negative for arthralgias, myalgias, neck pain and neck stiffness.  Skin: Negative for rash.  Neurological: Negative for dizziness, weakness and headaches.  All other systems reviewed and are negative.    Physical Exam Updated Vital Signs BP 145/87 (BP Location: Right Arm)   Pulse 74   Temp 98.1 F (36.7 C) (Oral)   Resp 20   Ht 5\' 6"  (1.676 m)   Wt 84.8 kg   SpO2 100%   BMI 30.18 kg/m   Physical Exam  Constitutional: She appears well-developed and well-nourished. No distress.  HENT:  Head: Normocephalic.  Eyes: Conjunctivae are normal.  Neck: Neck supple.  Cardiovascular: Normal rate, regular rhythm and normal heart sounds.   Pulmonary/Chest: Effort normal and breath sounds normal. No respiratory distress. She has no wheezes. She has no rales.  Abdominal: Soft. Bowel sounds are normal. She exhibits no distension. There is tenderness. There is no rebound and no guarding.  LLQ tenderness.   Musculoskeletal: She exhibits no edema.  Neurological: She is alert.  Skin: Skin is warm and dry.  Psychiatric: She has a normal mood and affect. Her behavior is normal.  Nursing note and vitals reviewed.    ED Treatments / Results  Labs (all labs ordered are listed, but only abnormal results are displayed) Labs Reviewed  URINALYSIS, ROUTINE W REFLEX MICROSCOPIC (NOT AT [facility]) - Abnormal; Notable for the following:       Result Value   Color, Urine AMBER (*)    Leukocytes, UA TRACE (*)    All other components within normal limits  COMPREHENSIVE METABOLIC PANEL - Abnormal; Notable for the following:    Total Protein 8.2 (*)    Alkaline Phosphatase 129 (*)    All other components within normal limits  URINE MICROSCOPIC-ADD ON - Abnormal; Notable for the following:    Squamous Epithelial / LPF 0-5 (*)    Bacteria, UA FEW (*)    All other components within normal limits  CBC WITH DIFFERENTIAL/PLATELET    EKG  EKG Interpretation None       Radiology Dg  Abd 2 Views  Result Date: 08/18/2016 CLINICAL DATA:  Abdominal pain with constipation, diarrhea, bloody stools for 5 days EXAM: ABDOMEN - 2 VIEW COMPARISON:  CT abdomen 06/10/2016 FINDINGS: The bowel gas pattern is normal. There is no evidence of free air. No radio-opaque calculi or other significant radiographic abnormality is seen. IMPRESSION: Negative. Electronically Signed   By: Elige Ko   On: 08/18/2016 10:54    Procedures Procedures (including critical care time)  Medications Ordered in ED Medications  sodium chloride 0.9 % bolus 1,000 mL (not administered)  morphine 4 MG/ML injection 4 mg (not administered)  ondansetron (ZOFRAN) injection 4 mg (not administered)     Initial Impression / Assessment and Plan / ED Course  I have reviewed the triage vital signs and the nursing notes.  Pertinent labs & imaging results that were available during my care of the patient were reviewed by me and considered in my medical decision making (see chart for details).  Clinical Course    10:10 AM Patient seen and examined. Patient with recurrent left lower quadrant pain, bright red blood per rectum. Symptoms began this morning. Patient with multiple visits for the same over last several months, has had several CT scans done which showed diverticulitis. Last CT scan was done on 07/10/16 at [facility], showed focal diverticulitis. Will check blood work, fluids, pain medications, reassess.  11:04 AM Continues to have pain, however slightly improved. Will give more medications and reassess.   Pt feeling much better. On todays exam, no guarding, no peritoneal signs. Negative plain film with no sings of free air. Most likely recurrent diverticulitis. Will restart antibiotics. Home with phenergan, norco, cipro, flagyl, follow up with her surgeon of primary care doctor. Return precautions discussed.   Vitals:   08/18/16 0922 08/18/16 0932 08/18/16 1135 08/18/16 1218  BP:  145/87 143/78 149/82  Pulse:   74 68 75  Resp:  20 16 16   Temp:  98.1 F (36.7 C)    TempSrc:  Oral    SpO2:  100%  100% 98%  Weight: 84.8 kg     Height: 5\' 6"  (1.676 m)        Final Clinical Impressions(s) / ED Diagnoses   Final diagnoses:  Abdominal pain  Left lower quadrant pain    New Prescriptions Discharge Medication List as of 08/18/2016 12:13 PM    START taking these medications   Details  promethazine (PHENERGAN) 25 MG tablet Take 1 tablet (25 mg total) by mouth every 6 (six) hours as needed for nausea or vomiting., Starting Mon 08/18/2016, Print         Jaynie Crumbleatyana Ras Kollman, PA-C 08/18/16 1410    Gwyneth SproutWhitney Plunkett, MD 08/18/16 2052

## 2016-12-30 ENCOUNTER — Encounter (HOSPITAL_BASED_OUTPATIENT_CLINIC_OR_DEPARTMENT_OTHER): Payer: Self-pay | Admitting: *Deleted

## 2016-12-30 ENCOUNTER — Emergency Department (HOSPITAL_BASED_OUTPATIENT_CLINIC_OR_DEPARTMENT_OTHER): Payer: Medicare Other

## 2016-12-30 ENCOUNTER — Emergency Department (HOSPITAL_BASED_OUTPATIENT_CLINIC_OR_DEPARTMENT_OTHER)
Admission: EM | Admit: 2016-12-30 | Discharge: 2016-12-30 | Disposition: A | Discharge status: Home / Self Care | Payer: Medicare Other | Attending: Emergency Medicine | Admitting: Emergency Medicine

## 2016-12-30 DIAGNOSIS — Z7982 Long term (current) use of aspirin: Secondary | ICD-10-CM | POA: Insufficient documentation

## 2016-12-30 DIAGNOSIS — N182 Chronic kidney disease, stage 2 (mild): Secondary | ICD-10-CM | POA: Insufficient documentation

## 2016-12-30 DIAGNOSIS — Z79899 Other long term (current) drug therapy: Secondary | ICD-10-CM | POA: Diagnosis not present

## 2016-12-30 DIAGNOSIS — J189 Pneumonia, unspecified organism: Secondary | ICD-10-CM | POA: Insufficient documentation

## 2016-12-30 DIAGNOSIS — N3000 Acute cystitis without hematuria: Secondary | ICD-10-CM

## 2016-12-30 DIAGNOSIS — M791 Myalgia, unspecified site: Secondary | ICD-10-CM

## 2016-12-30 DIAGNOSIS — R5381 Other malaise: Secondary | ICD-10-CM | POA: Diagnosis present

## 2016-12-30 DIAGNOSIS — I129 Hypertensive chronic kidney disease with stage 1 through stage 4 chronic kidney disease, or unspecified chronic kidney disease: Secondary | ICD-10-CM | POA: Insufficient documentation

## 2016-12-30 DIAGNOSIS — B349 Viral infection, unspecified: Secondary | ICD-10-CM | POA: Insufficient documentation

## 2016-12-30 LAB — COMPREHENSIVE METABOLIC PANEL
ALBUMIN: 4.4 g/dL (ref 3.5–5.0)
ALT: 21 U/L (ref 14–54)
AST: 20 U/L (ref 15–41)
Alkaline Phosphatase: 99 U/L (ref 38–126)
Anion gap: 6 (ref 5–15)
BUN: 14 mg/dL (ref 6–20)
CHLORIDE: 106 mmol/L (ref 101–111)
CO2: 25 mmol/L (ref 22–32)
CREATININE: 0.84 mg/dL (ref 0.44–1.00)
Calcium: 9.9 mg/dL (ref 8.9–10.3)
GFR calc non Af Amer: >60 mL/min (ref >60)
GLUCOSE: 107 mg/dL — AB (ref 65–99)
Potassium: 4.3 mmol/L (ref 3.5–5.1)
SODIUM: 137 mmol/L (ref 135–145)
Total Bilirubin: 0.8 mg/dL (ref 0.3–1.2)
Total Protein: 7.7 g/dL (ref 6.5–8.1)

## 2016-12-30 LAB — CBC WITH DIFFERENTIAL/PLATELET
BASOS ABS: 0 10*3/uL (ref 0.0–0.1)
BASOS PCT: 1 %
EOS ABS: 0.2 10*3/uL (ref 0.0–0.7)
EOS PCT: 5 %
HCT: 34.9 % — ABNORMAL LOW (ref 36.0–46.0)
Hemoglobin: 12 g/dL (ref 12.0–15.0)
Lymphocytes Relative: 32 %
Lymphs Abs: 1 10*3/uL (ref 0.7–4.0)
MCH: 29.3 pg (ref 26.0–34.0)
MCHC: 34.4 g/dL (ref 30.0–36.0)
MCV: 85.3 fL (ref 78.0–100.0)
Monocytes Absolute: 0.2 10*3/uL (ref 0.1–1.0)
Monocytes Relative: 7 %
NEUTROS PCT: 55 %
Neutro Abs: 1.8 10*3/uL (ref 1.7–7.7)
PLATELETS: 238 10*3/uL (ref 150–400)
RBC: 4.09 MIL/uL (ref 3.87–5.11)
RDW: 13.8 % (ref 11.5–15.5)
WBC: 3.2 10*3/uL — AB (ref 4.0–10.5)

## 2016-12-30 LAB — URINALYSIS, ROUTINE W REFLEX MICROSCOPIC
Bilirubin Urine: NEGATIVE
Glucose, UA: NEGATIVE mg/dL
HGB URINE DIPSTICK: NEGATIVE
Ketones, ur: NEGATIVE mg/dL
Nitrite: NEGATIVE
Protein, ur: NEGATIVE mg/dL
SPECIFIC GRAVITY, URINE: 1.02 (ref 1.005–1.030)
pH: 6 (ref 5.0–8.0)

## 2016-12-30 LAB — URINALYSIS, MICROSCOPIC (REFLEX)

## 2016-12-30 LAB — MAGNESIUM: Magnesium: 1.8 mg/dL (ref 1.7–2.4)

## 2016-12-30 MED ORDER — KETOROLAC TROMETHAMINE 15 MG/ML IJ SOLN
15.0000 mg | Freq: Once | INTRAMUSCULAR | Status: AC
  Administered 2016-12-30: 15 mg via INTRAVENOUS
  Filled 2016-12-30: qty 1

## 2016-12-30 MED ORDER — LEVOFLOXACIN 750 MG PO TABS: 750.0000 mg | ORAL_TABLET | Freq: Every day | ORAL | 0 refills | Status: AC

## 2016-12-30 MED ORDER — SODIUM CHLORIDE 0.9 % IV BOLUS (SEPSIS)
1000.0000 mL | Freq: Once | INTRAVENOUS | Status: AC
  Administered 2016-12-30: 1000 mL via INTRAVENOUS

## 2016-12-30 MED ORDER — LEVOFLOXACIN 750 MG PO TABS
750.0000 mg | ORAL_TABLET | Freq: Once | ORAL | Status: AC
  Administered 2016-12-30: 750 mg via ORAL
  Filled 2016-12-30: qty 1

## 2016-12-30 MED ORDER — ACETAMINOPHEN 325 MG PO TABS
650.0000 mg | ORAL_TABLET | Freq: Once | ORAL | Status: AC
  Administered 2016-12-30: 650 mg via ORAL
  Filled 2016-12-30: qty 2

## 2016-12-30 MED FILL — levoFLOXacin 750 MG TABS: 750 | 4 days supply | Qty: 4 | Fill #0

## 2016-12-30 NOTE — Discharge Instructions (Signed)
Please take the antibiotics to treat the possible pneumonia and possible urinary tract infections. I think this is making her feel bad all over. Please stay hydrated. Please follow-up with your primary care physician in the next few days and return to the emergency department if you begin having any worsened or new symptoms.

## 2016-12-30 NOTE — ED Provider Notes (Signed)
[facility] Provider Note   CSN: [id number] Arrival date & time: 12/30/16  5366     History   Chief Complaint Chief Complaint  Patient presents with  . Generalized Body Aches    HPI Christina Vang is a 66 y.o. female with a past medical history significant for asthma, atrial fibrillation, GERD, hypertension, CKD, severe fibromyalgia, hyperparathyroidism scheduled to have her parathyroids out, mitral valve prolapse, and chronic back pain who presents with malaise, chills, night sweats, aching all over, and sensation that her electrolytes are off. Patient says she has felt similar when she has had problems with her potassium, calcium, and sodium. She reports that she is having her fibromyalgia pain in her arms, neck, and back. She says this is similar to prior. She reports that she began feeling that yesterday he felt worse overnight. She says that she is scheduled to see a pain specialist this month for further part mouth pain management. She reports that she is scheduled to have her parathyroids removed next month for her hyperparathyroidism. She also says that she may have to have part of her colon removed in the near future. She denies any recent sick contacts. She denies any rhinorrhea, cough, or significant congestion. She denies any chest pain, shortness breath, or abdominal pain. He denies any dysuria, constipation, or diarrhea. She states that she's been eating and drinking normally. He denies any other complaints on arrival.    The history is provided by the patient and medical records. No language interpreter was used.  Illness  This is a recurrent problem. The current episode started yesterday. The problem occurs constantly. The problem has not changed since onset.Pertinent negatives include no chest pain, no abdominal pain and no shortness of breath. Nothing aggravates the symptoms. Nothing relieves the symptoms. She has tried nothing for the symptoms. The treatment  provided no relief.  Back Pain   This is a recurrent problem. The pain is associated with no known injury. Pain location: upper back. The quality of the pain is described as aching. The pain does not radiate. The pain is the same all the time. Associated symptoms include a fever. Pertinent negatives include no chest pain, no abdominal pain and no dysuria.    Past Medical History:  Diagnosis Date  . Acute renal disease   . Anemia   . Anxiety   . Arthritis   . Atrial fibrillation (HCC)   . Bronchial asthma   . Chronic back pain   . Chronic kidney disease (CKD), stage II (mild)   . Daily headache    "recently" (07/31/2014)  . Depression   . Diverticulitis   . Fibromyalgia   . GERD (gastroesophageal reflux disease)   . H/O hiatal hernia   . High cholesterol   . Hyperparathyroidism (HCC)   . Hypertension   . Mitral valve prolapse   . Osteopenia   . Pneumonia X 2    Patient Active Problem List   Diagnosis Date Noted  . Sepsis (HCC) 12/18/2015  . Normocytic anemia 12/18/2015  . Acute diverticulitis 12/17/2015  . Overdose 07/31/2014  . Dizziness 08/19/2013  . Hypertensive urgency 08/19/2013  . Fibromyalgia   . Hyperparathyroidism (HCC)   . GERD (gastroesophageal reflux disease)   . Hyperthyroidism without goitre-TSH 0.22 11/16/2011  . Steatosis of liver-per [facility] 12.30 11/16/2011  . Hypercalcemia 11/15/2011  . Hypokalemia 11/15/2011  . Headache(784.0) 11/15/2011  . HTN (hypertension) 11/15/2011    Past Surgical History:  Procedure Laterality Date  . CARDIAC  CATHETERIZATION     "several"  . CHOLECYSTECTOMY  ~ 2008  . HERNIA REPAIR    . KNEE ARTHROSCOPY Left 1990's  . LIVER BIOPSY  1990's   "fatty liver"  . NISSEN FUNDOPLICATION  1980's  . TONSILLECTOMY AND ADENOIDECTOMY  ~ 1960  . 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 TUBAL LIGATION  1972  . VAGINAL HYSTERECTOMY  1980's    OB History    No data available       Home  Medications    Prior to Admission medications   Medication Sig Start Date End Date Taking? Authorizing Provider  cyclobenzaprine (FLEXERIL) 10 MG tablet Take 10 mg by mouth 3 (three) times daily as needed for muscle spasms.   Yes Historical Provider, MD  labetalol (NORMODYNE) 300 MG tablet Take 300 mg by mouth 2 (two) times daily.   Yes Historical Provider, MD  albuterol (PROVENTIL HFA;VENTOLIN HFA) 108 (90 BASE) MCG/ACT inhaler Inhale 2 puffs into the lungs 2 (two) times daily as needed for wheezing or shortness of breath.    Historical Provider, MD  amLODipine (NORVASC) 5 MG tablet Take 1 tablet (5 mg total) by mouth daily. Patient taking differently: Take 5 mg by mouth 2 (two) times daily.  12/21/15   Catarina Hartshornavid Tat, MD  aspirin EC 81 MG tablet Take 81 mg by mouth daily.    Historical Provider, MD  atorvastatin (LIPITOR) 20 MG tablet Take 20 mg by mouth at bedtime.     Historical Provider, MD  cetirizine (ZYRTEC) 10 MG tablet Take 10 mg by mouth daily.    Historical Provider, MD  cholecalciferol (VITAMIN D) 1000 units tablet Take 1,000 Units by mouth daily.    Historical Provider, MD  ciprofloxacin (CIPRO) 500 MG tablet Take 1 tablet (500 mg total) by mouth every 12 (twelve) hours. 08/18/16   Tatyana Kirichenko, PA-C  diclofenac sodium (VOLTAREN) 1 % GEL Apply 1 application topically 2 (two) times daily as needed (pain).    Historical Provider, MD  DULoxetine (CYMBALTA) 60 MG capsule Take 60 mg by mouth daily as needed (pain).     Historical Provider, MD  eplerenone (INSPRA) 50 MG tablet Take 100 mg by mouth 2 (two) times daily. 09/25/15   Historical Provider, MD  eszopiclone (LUNESTA) 2 MG TABS tablet Take 2 mg by mouth at bedtime as needed for sleep. Take immediately before bedtime    Historical Provider, MD  gabapentin (NEURONTIN) 100 MG capsule Take 600 mg by mouth at bedtime.  11/05/15   Historical Provider, MD  HYDROcodone-acetaminophen (NORCO) 5-325 MG tablet Take 1 tablet by mouth every 6 (six)  hours as needed for moderate pain. 08/18/16   Tatyana Kirichenko, PA-C  labetalol (NORMODYNE) 200 MG tablet Take 1 tablet (200 mg total) by mouth 2 (two) times daily. 12/21/15 06/10/16  Catarina Hartshornavid Tat, MD  Linaclotide (LINZESS) 290 MCG CAPS capsule Take 290 mcg by mouth daily.    Historical Provider, MD  meloxicam (MOBIC) 15 MG tablet Take 15 mg by mouth daily.    Historical Provider, MD  metroNIDAZOLE (FLAGYL) 500 MG tablet Take 1 tablet (500 mg total) by mouth 3 (three) times daily. 08/18/16   Tatyana Kirichenko, PA-C  omega-3 acid ethyl esters (LOVAZA) 1 G capsule Take 1 g by mouth 2 (two) times daily.    Historical Provider, MD  omeprazole (PRILOSEC) 40 MG capsule Take 40 mg by mouth daily.    Historical Provider, MD  promethazine (PHENERGAN) 25 MG tablet  Take 1 tablet (25 mg total) by mouth every 6 (six) hours as needed for nausea or vomiting. 08/18/16   Jaynie Crumble, PA-C    Family History Family History  Problem Relation Age of Onset  . Hypertension Mother   . Stroke Mother   . Diabetes Mother   . Cancer Mother   . Hypertension Father   . Stroke Father   . Lung cancer Father   . Diabetes Sister   . Diabetes Brother   . Pancreatic cancer Paternal Aunt     Social History Social History  Substance Use Topics  . Smoking status: Never Smoker  . Smokeless tobacco: Never Used  . Alcohol use No     Allergies   Augmentin [amoxicillin-pot clavulanate]; Codeine; Meloxicam; and Tetracyclines & related   Review of Systems Review of Systems  Constitutional: Positive for chills, diaphoresis, fatigue and fever.  HENT: Negative for congestion and rhinorrhea.   Eyes: Negative for visual disturbance.  Respiratory: Negative for cough, chest tightness, shortness of breath, wheezing and stridor.   Cardiovascular: Negative for chest pain, palpitations and leg swelling.  Gastrointestinal: Negative for abdominal distention, abdominal pain, constipation, diarrhea, nausea and vomiting.    Genitourinary: Negative for difficulty urinating and dysuria.  Musculoskeletal: Positive for back pain, myalgias and neck pain (lateral neck soreness). Negative for neck stiffness.  Skin: Negative for rash and wound.  Neurological: Negative for seizures, syncope and light-headedness.  Psychiatric/Behavioral: Negative for agitation and confusion.  All other systems reviewed and are negative.    Physical Exam Updated Vital Signs BP 127/82 (BP Location: Left Arm)   Pulse 68   Temp 98.3 F (36.8 C) (Oral)   Resp 20   Ht 5' 6.5" (1.689 m)   Wt 185 lb 3 oz (84 kg)   SpO2 98%   BMI 29.44 kg/m   Physical Exam  Constitutional: She is oriented to person, place, and time. She appears well-developed and well-nourished. No distress.  HENT:  Head: Normocephalic and atraumatic.  Right Ear: External ear normal.  Left Ear: External ear normal.  Nose: Nose normal.  Mouth/Throat: Oropharynx is clear and moist. No oropharyngeal exudate.  Eyes: Conjunctivae and EOM are normal. Pupils are equal, round, and reactive to light.  Neck: Normal range of motion. Neck supple.  Cardiovascular: Normal rate and intact distal pulses.   Murmur heard. Pulmonary/Chest: Effort normal and breath sounds normal. No stridor. No respiratory distress. She has no wheezes. She exhibits no tenderness.  Abdominal: She exhibits no distension. There is no tenderness. There is no rebound.  Musculoskeletal: She exhibits no tenderness.  Neurological: She is alert and oriented to person, place, and time. She has normal reflexes. No cranial nerve deficit or sensory deficit. She exhibits normal muscle tone.  Skin: Skin is warm. Capillary refill takes less than 2 seconds. No rash noted. She is not diaphoretic. No erythema.  Psychiatric: She has a normal mood and affect.  Nursing note and vitals reviewed.    ED Treatments / Results  Labs (all labs ordered are listed, but only abnormal results are displayed) Labs Reviewed   CBC WITH DIFFERENTIAL/PLATELET - Abnormal; Notable for the following:       Result Value   WBC 3.2 (*)    HCT 34.9 (*)    All other components within normal limits  COMPREHENSIVE METABOLIC PANEL - Abnormal; Notable for the following:    Glucose, Bld 107 (*)    All other components within normal limits  URINALYSIS, ROUTINE W REFLEX MICROSCOPIC -  Abnormal; Notable for the following:    Leukocytes, UA TRACE (*)    All other components within normal limits  URINALYSIS, MICROSCOPIC (REFLEX) - Abnormal; Notable for the following:    Bacteria, UA FEW (*)    Squamous Epithelial / LPF 0-5 (*)    All other components within normal limits  URINE CULTURE  MAGNESIUM    EKG  EKG Interpretation None       Radiology Dg Chest 2 View  Result Date: 12/30/2016 CLINICAL DATA:  Chills.  Tiredness. EXAM: CHEST  2 VIEW COMPARISON:  10/31/2015. FINDINGS: Mediastinum and hilar structures are normal. Low lung volumes. Minimal lingular infiltrate cannot be excluded. No pleural effusion or pneumothorax. IMPRESSION: Low lung volumes.  Minimal lingular infiltrate cannot be excluded. Electronically Signed   By: Maisie Fus  Register   On: 12/30/2016 07:36    Procedures Procedures (including critical care time)  Medications Ordered in ED Medications  sodium chloride 0.9 % bolus 1,000 mL (0 mLs Intravenous Stopped 12/30/16 0833)  acetaminophen (TYLENOL) tablet 650 mg (650 mg Oral Given 12/30/16 0720)  ketorolac (TORADOL) 15 MG/ML injection 15 mg (15 mg Intravenous Given 12/30/16 0854)  levofloxacin (LEVAQUIN) tablet 750 mg (750 mg Oral Given 12/30/16 1015)     Initial Impression / Assessment and Plan / ED Course  I have reviewed the triage vital signs and the nursing notes.  Pertinent labs & imaging results that were available during my care of the patient were reviewed by me and considered in my medical decision making (see chart for details).     Christina Vang is a 66 y.o. female with a past medical  history significant for asthma, atrial fibrillation, GERD, hypertension, CKD, severe fibromyalgia, hyperparathyroidism scheduled to have her parathyroids out, mitral valve prolapse, and chronic back pain who presents with malaise, chills, night sweats, aching all over, and sensation that her electrolytes are off.  History and exam are seen above.  On exam, patient's lungs are clear. Abdomen is nontender. Chest is nontender. No focal neurologic deficits. No nuchal rigidity. Musculoskeletal tenderness in her shoulders and neck.  Based on symptoms, suspect fibromyalgia flare as cause of patient's discomfort in her neck and shoulders. Patient does say that given her history of endocrine problems and parathyroid troubles, she thinks her electrolytes may be "way off" causing her symptoms. Thus, patient will have labs to check her electrolytes, workup to look for occult infection and may have prompted her chills and malaise, and she'll be given fluids for rehydration.  Anticipate reassessment following workup.  Diagnostic workup results are seen above. Chest x-ray shows evidence of possible pneumonia. Urinalysis shows evidence of possible UTI. Patient will be treated with levofloxacin to treat both of these Infections. Patient will follow up with PCP in several days as well as return if symptoms worsen. Patient understood plan of care and had no other questions or concerns. Patient felt much better after fluids.  Patient discharged after dose of levofloxacin in good condition with understanding of plan of care for [place], UTI, and pneumonia.   Final Clinical Impressions(s) / ED Diagnoses   Final diagnoses:  Community acquired pneumonia of left lung, unspecified part of lung  Acute cystitis without hematuria  Viral illness  Myalgia    New Prescriptions Discharge Medication List as of 12/30/2016 10:09 AM    START taking these medications   Details  levofloxacin (LEVAQUIN) 750 MG tablet Take 1  tablet (750 mg total) by mouth daily., Starting Wed 12/31/2016, Until Sun 01/04/2017, Print  Clinical Impression: 1. Community acquired pneumonia of left lung, unspecified part of lung   2. Acute cystitis without hematuria   3. Viral illness   4. Myalgia     Disposition: Discharge  Condition: Good  I have discussed the results, Dx and Tx plan with the pt(& family if present). He/she/they expressed understanding and agree(s) with the plan. Discharge instructions discussed at great length. Strict return precautions discussed and pt &/or family have verbalized understanding of the instructions. No further questions at time of discharge.    Discharge Medication List as of 12/30/2016 10:09 AM    START taking these medications   Details  levofloxacin (LEVAQUIN) 750 MG tablet Take 1 tablet (750 mg total) by mouth daily., Starting Wed 12/31/2016, Until Sun 01/04/2017, Print        Follow Up: [facility] [address] [place] [address] [phone]  Schedule an appointment as soon as possible for a visit    [facility]Y DEPARTMENT [address] [id number] mc [address] [place] [address] [phone]  If symptoms worsen     Heide Scales, MD 12/30/16 2044

## 2016-12-30 NOTE — ED Triage Notes (Signed)
Pt states night sweats x 2, mild Head ache that comes and goes dry mouth, tingling, body pain  States thinks potassium or sodium is low

## 2016-12-31 LAB — URINE CULTURE: CULTURE: NO GROWTH

## 2017-07-11 ENCOUNTER — Encounter (HOSPITAL_BASED_OUTPATIENT_CLINIC_OR_DEPARTMENT_OTHER): Payer: Self-pay | Admitting: Emergency Medicine

## 2017-07-11 ENCOUNTER — Emergency Department (HOSPITAL_BASED_OUTPATIENT_CLINIC_OR_DEPARTMENT_OTHER)
Admission: EM | Admit: 2017-07-11 | Discharge: 2017-07-12 | Disposition: A | Discharge status: Home / Self Care | Payer: Medicare Other | Attending: Emergency Medicine | Admitting: Emergency Medicine

## 2017-07-11 DIAGNOSIS — I129 Hypertensive chronic kidney disease with stage 1 through stage 4 chronic kidney disease, or unspecified chronic kidney disease: Secondary | ICD-10-CM | POA: Insufficient documentation

## 2017-07-11 DIAGNOSIS — J45909 Unspecified asthma, uncomplicated: Secondary | ICD-10-CM | POA: Insufficient documentation

## 2017-07-11 DIAGNOSIS — N182 Chronic kidney disease, stage 2 (mild): Secondary | ICD-10-CM | POA: Insufficient documentation

## 2017-07-11 DIAGNOSIS — Z79899 Other long term (current) drug therapy: Secondary | ICD-10-CM | POA: Insufficient documentation

## 2017-07-11 DIAGNOSIS — N3000 Acute cystitis without hematuria: Secondary | ICD-10-CM | POA: Insufficient documentation

## 2017-07-11 DIAGNOSIS — Z7982 Long term (current) use of aspirin: Secondary | ICD-10-CM | POA: Insufficient documentation

## 2017-07-11 DIAGNOSIS — R52 Pain, unspecified: Secondary | ICD-10-CM | POA: Diagnosis present

## 2017-07-11 LAB — BASIC METABOLIC PANEL
Anion gap: 10 (ref 5–15)
BUN: 21 mg/dL — AB (ref 6–20)
CALCIUM: 9.8 mg/dL (ref 8.9–10.3)
CHLORIDE: 106 mmol/L (ref 101–111)
CO2: 21 mmol/L — ABNORMAL LOW (ref 22–32)
CREATININE: 1.36 mg/dL — AB (ref 0.44–1.00)
GFR, EST AFRICAN AMERICAN: 46 mL/min — AB (ref >60)
GFR, EST NON AFRICAN AMERICAN: 40 mL/min — AB (ref >60)
Glucose, Bld: 93 mg/dL (ref 65–99)
Potassium: 4.8 mmol/L (ref 3.5–5.1)
SODIUM: 137 mmol/L (ref 135–145)

## 2017-07-11 LAB — URINALYSIS, ROUTINE W REFLEX MICROSCOPIC
GLUCOSE, UA: NEGATIVE mg/dL
Hgb urine dipstick: NEGATIVE
KETONES UR: 15 mg/dL — AB
NITRITE: NEGATIVE
PH: 5 (ref 5.0–8.0)
PROTEIN: NEGATIVE mg/dL
Specific Gravity, Urine: 1.025 (ref 1.005–1.030)

## 2017-07-11 LAB — CBC
HCT: 36.1 % (ref 36.0–46.0)
HEMOGLOBIN: 12.3 g/dL (ref 12.0–15.0)
MCH: 29.6 pg (ref 26.0–34.0)
MCHC: 34.1 g/dL (ref 30.0–36.0)
MCV: 86.8 fL (ref 78.0–100.0)
PLATELETS: 209 10*3/uL (ref 150–400)
RBC: 4.16 MIL/uL (ref 3.87–5.11)
RDW: 13.7 % (ref 11.5–15.5)
WBC: 4.1 10*3/uL (ref 4.0–10.5)

## 2017-07-11 LAB — URINALYSIS, MICROSCOPIC (REFLEX)

## 2017-07-11 MED ORDER — DEXTROSE 5 % IV SOLN
1.0000 g | Freq: Once | INTRAVENOUS | Status: AC
  Administered 2017-07-11: 1 g via INTRAVENOUS
  Filled 2017-07-11: qty 10

## 2017-07-11 MED ORDER — CEPHALEXIN 500 MG PO CAPS: 500.0000 mg | ORAL_CAPSULE | Freq: Four times a day (QID) | ORAL | 0 refills | Status: AC

## 2017-07-11 MED ORDER — SODIUM CHLORIDE 0.9 % IV BOLUS (SEPSIS)
1000.0000 mL | Freq: Once | INTRAVENOUS | Status: AC
  Administered 2017-07-11: 1000 mL via INTRAVENOUS

## 2017-07-11 NOTE — ED Provider Notes (Signed)
[facility] Provider Note   CSN: [id number] Arrival date & time: 07/11/17  1821     History   Chief Complaint Chief Complaint  Patient presents with  . Generalized Body Aches    HPI Christina Vang is a 65 y.o. female.  HPI  66 y.o. female with a hx of CKD Stage II, HTN, presents to the Emergency Department today due to generalized body aches. 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 States that she knows when her sodium and potassium is low. Notes diffuse muscle cramping. No pain. No fevers. No CP/SOB/ABD pain. No cough/congestion. No N/V/D. Seen by PCP on 07-06-17 and found to be mildly hyperkalemic. Told to continue Eplerenone and Losartan. Diet modification. No other symptoms noted.   Past Medical History:  Diagnosis Date  . Acute renal disease   . Anemia   . Anxiety   . Arthritis   . Atrial fibrillation (HCC)   . Bronchial asthma   . Chronic back pain   . Chronic kidney disease (CKD), stage II (mild)   . Daily headache    "recently" (07/31/2014)  . Depression   . Diverticulitis   . Fibromyalgia   . GERD (gastroesophageal reflux disease)   . H/O hiatal hernia   . High cholesterol   . Hyperparathyroidism (HCC)   . Hypertension   . Mitral valve prolapse   . Osteopenia   . Pneumonia X 2    Patient Active Problem List   Diagnosis Date Noted  . Sepsis (HCC) 12/18/2015  . Normocytic anemia 12/18/2015  . Acute diverticulitis 12/17/2015  . Overdose 07/31/2014  . Dizziness 08/19/2013  . Hypertensive urgency 08/19/2013  . Fibromyalgia   . Hyperparathyroidism (HCC)   . GERD (gastroesophageal reflux disease)   . Hyperthyroidism without goitre-TSH 0.22 11/16/2011  . Steatosis of liver-per [facility] 12.30 11/16/2011  . Hypercalcemia 11/15/2011  . Hypokalemia 11/15/2011  . Headache(784.0) 11/15/2011  . HTN (hypertension) 11/15/2011    Past Surgical History:  Procedure Laterality  Date  . CARDIAC CATHETERIZATION     "several"  . CHOLECYSTECTOMY  ~ 2008  . HERNIA REPAIR    . KNEE ARTHROSCOPY Left 1990's  . LIVER BIOPSY  1990's   "fatty liver"  . NISSEN FUNDOPLICATION  1980's  . TONSILLECTOMY AND ADENOIDECTOMY  ~ 1960  . 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 TUBAL LIGATION  1972  . VAGINAL HYSTERECTOMY  1980's    OB History    No data available       Home Medications    Prior to Admission medications   Medication Sig Start Date End Date Taking? Authorizing Provider  albuterol (PROVENTIL HFA;VENTOLIN HFA) 108 (90 BASE) MCG/ACT inhaler Inhale 2 puffs into the lungs 2 (two) times daily as needed for wheezing or shortness of breath.    [provider]  amLODipine (NORVASC) 5 MG tablet Take 1 tablet (5 mg total) by mouth daily. Patient taking differently: Take 5 mg by mouth 2 (two) times daily.  12/21/15   Catarina Hartshorn, MD  aspirin EC 81 MG tablet Take 81 mg by mouth daily.    [provider]  atorvastatin (LIPITOR) 20 MG tablet Take 20 mg by mouth at bedtime.     [provider]  cetirizine (ZYRTEC) 10 MG tablet Take 10 mg by mouth daily.    [provider]  cholecalciferol (VITAMIN D) 1000 units  tablet Take 1,000 Units by mouth daily.    [provider]  ciprofloxacin (CIPRO) 500 MG tablet Take 1 tablet (500 mg total) by mouth every 12 (twelve) hours. 08/18/16   Kirichenko, Tatyana, PA-C  cyclobenzaprine (FLEXERIL) 10 MG tablet Take 10 mg by mouth 3 (three) times daily as needed for muscle spasms.    [provider]  diclofenac sodium (VOLTAREN) 1 % GEL Apply 1 application topically 2 (two) times daily as needed (pain).    [provider]  DULoxetine (CYMBALTA) 60 MG capsule Take 60 mg by mouth daily as needed (pain).     [provider]  eplerenone (INSPRA) 50 MG tablet Take 100 mg by mouth 2 (two) times daily. 09/25/15   [provider]    eszopiclone (LUNESTA) 2 MG TABS tablet Take 2 mg by mouth at bedtime as needed for sleep. Take immediately before bedtime    [provider]  gabapentin (NEURONTIN) 100 MG capsule Take 600 mg by mouth at bedtime.  11/05/15   [provider]  HYDROcodone-acetaminophen (NORCO) 5-325 MG tablet Take 1 tablet by mouth every 6 (six) hours as needed for moderate pain. 08/18/16   Kirichenko, Tatyana, PA-C  labetalol (NORMODYNE) 200 MG tablet Take 1 tablet (200 mg total) by mouth 2 (two) times daily. 12/21/15 06/10/16  Catarina Hartshorn, MD  labetalol (NORMODYNE) 300 MG tablet Take 300 mg by mouth 2 (two) times daily.    [provider]  Linaclotide (LINZESS) 290 MCG CAPS capsule Take 290 mcg by mouth daily.    [provider]  meloxicam (MOBIC) 15 MG tablet Take 15 mg by mouth daily.    [provider]  metroNIDAZOLE (FLAGYL) 500 MG tablet Take 1 tablet (500 mg total) by mouth 3 (three) times daily. 08/18/16   Kirichenko, Lemont Fillers, PA-C  omega-3 acid ethyl esters (LOVAZA) 1 G capsule Take 1 g by mouth 2 (two) times daily.    [provider]  omeprazole (PRILOSEC) 40 MG capsule Take 40 mg by mouth daily.    [provider]  promethazine (PHENERGAN) 25 MG tablet Take 1 tablet (25 mg total) by mouth every 6 (six) hours as needed for nausea or vomiting. 08/18/16   Jaynie Crumble, PA-C    Family History Family History  Problem Relation Age of Onset  . Hypertension Mother   . Stroke Mother   . Diabetes Mother   . Cancer Mother   . Hypertension Father   . Stroke Father   . Lung cancer Father   . Diabetes Sister   . Diabetes Brother   . Pancreatic cancer Paternal Aunt     Social History Social History  Substance Use Topics  . Smoking status: Never Smoker  . Smokeless tobacco: Never Used  . Alcohol use No     Allergies   Augmentin [amoxicillin-pot clavulanate]; Codeine; Meloxicam; and Tetracyclines & related   Review of Systems Review  of Systems ROS reviewed and all are negative for acute change except as noted in the HPI.  Physical Exam Updated Vital Signs BP (!) 122/92 (BP Location: Left Arm)   Pulse 72   Temp 98.5 F (36.9 C) (Oral)   Resp 20   Ht 5' 6.5" (1.689 m)   Wt 90.7 kg (200 lb)   SpO2 100%   BMI 31.80 kg/m   Physical Exam  Constitutional: She is oriented to person, place, and time. Vital signs are normal. She appears well-developed and well-nourished. No distress.  HENT:  Head: Normocephalic  and atraumatic.  Right Ear: Hearing, tympanic membrane, external ear and ear canal normal.  Left Ear: Hearing, tympanic membrane, external ear and ear canal normal.  Nose: Nose normal.  Mouth/Throat: Uvula is midline, oropharynx is clear and moist and mucous membranes are normal. No trismus in the jaw. No oropharyngeal exudate, posterior oropharyngeal erythema or tonsillar abscesses.  Eyes: Pupils are equal, round, and reactive to light. Conjunctivae and EOM are normal.  Neck: Normal range of motion. Neck supple. No tracheal deviation present.  Cardiovascular: Normal rate, regular rhythm, S1 normal, S2 normal, normal heart sounds, intact distal pulses and normal pulses.   Pulmonary/Chest: Effort normal and breath sounds normal. No respiratory distress. She has no decreased breath sounds. She has no wheezes. She has no rhonchi. She has no rales.  Abdominal: Soft. Normal appearance and bowel sounds are normal. There is no tenderness.  Musculoskeletal: Normal range of motion.  Neurological: She is alert and oriented to person, place, and time.  Skin: Skin is warm and dry.  Psychiatric: She has a normal mood and affect. Her speech is normal and behavior is normal. Thought content normal.  Nursing note and vitals reviewed.  ED Treatments / Results  Labs (all labs ordered are listed, but only abnormal results are displayed) Labs Reviewed  BASIC METABOLIC PANEL - Abnormal; Notable for the following:       Result  Value   CO2 21 (*)    BUN 21 (*)    Creatinine, Ser 1.36 (*)    GFR calc non Af Amer 40 (*)    GFR calc Af Amer 46 (*)    All other components within normal limits  URINALYSIS, ROUTINE W REFLEX MICROSCOPIC - Abnormal; Notable for the following:    Color, Urine AMBER (*)    APPearance CLOUDY (*)    Bilirubin Urine SMALL (*)    Ketones, ur 15 (*)    Leukocytes, UA MODERATE (*)    All other components within normal limits  URINALYSIS, MICROSCOPIC (REFLEX) - Abnormal; Notable for the following:    Bacteria, UA MANY (*)    Squamous Epithelial / LPF 0-5 (*)    All other components within normal limits  CBC   EKG  EKG Interpretation None      Radiology No results found.  Procedures Procedures (including critical care time)  Medications Ordered in ED Medications  cefTRIAXone (ROCEPHIN) 1 g in dextrose 5 % 50 mL IVPB (not administered)  sodium chloride 0.9 % bolus 1,000 mL (1,000 mLs Intravenous New Bag/Given 07/11/17 2228)     Initial Impression / Assessment and Plan / ED Course  I have reviewed the triage vital signs and the nursing notes.  Pertinent labs & imaging results that were available during my care of the patient were reviewed by me and considered in my medical decision making (see chart for details).  Final Clinical Impressions(s) / ED Diagnoses  {I have reviewed and evaluated the relevant laboratory values.   {I have reviewed the relevant previous healthcare records.  {I obtained HPI from historian.   ED Course:  Assessment: Pt is a 66 y.o. female with a hx of CKD Stage II, HTN, presents to the Emergency Department today due to generalized body aches. 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 States that she knows when her sodium and potassium is low. Notes diffuse muscle cramping. No pain. No fevers. No CP/SOB/ABD pain. No cough/congestion. No N/V/D.  Seen by PCP on 07-06-17 and found to be  mildly hyperkalemic. Told to continue Eplerenone and Losartan. Diet modification.. On exam, pt in NAD. Nontoxic/nonseptic appearing. VSS. Afebrile. Lungs CTA. Heart RRR. Abdomen nontender soft. CBC unremarkable. BMP with mild Creatinine elevation. UA shows evidence of UTI. Culture sent. Given rocephin in ED as well as fluids. Plan is to DC home with ABX and follow up to PCP. At time of discharge, Patient is in no acute distress. Vital Signs are stable. Patient is able to ambulate. Patient able to tolerate PO.    Disposition/Plan:  DC Home Additional Verbal discharge instructions given and discussed with patient.  Pt Instructed to f/u with PCP in the next week for evaluation and treatment of symptoms. Return precautions given Pt acknowledges and agrees with plan  Supervising Physician Tegeler, Canary Brim, *  Final diagnoses:  Acute cystitis without hematuria    New Prescriptions New Prescriptions   No medications on file     Audry Pili, Cordelia Poche 07/11/17 2351    Tegeler, Canary Brim, MD 07/12/17 [phone]

## 2017-07-11 NOTE — ED Triage Notes (Signed)
Patient states that she has an adrenal problem and she knows when she is had low sodium, or potassium. Patient states that she has aches all over and causes her to have pressure in her head and in her muscles. The patient reports that she has had this for over a week

## 2017-07-11 NOTE — Discharge Instructions (Signed)
Please read and follow all provided instructions.  Your diagnoses today include:  1. Acute cystitis without hematuria     Tests performed today include: Vital signs. See below for your results today.   Medications prescribed:  Take as prescribed   Home care instructions:  Follow any educational materials contained in this packet.  Follow-up instructions: Please follow-up with your primary care provider for further evaluation of symptoms and treatment   Return instructions:  Please return to the Emergency Department if you do not get better, if you get worse, or new symptoms OR  - Fever (temperature greater than 101.46F)  - Bleeding that does not stop with holding pressure to the area    -Severe pain (please note that you may be more sore the day after your accident)  - Chest Pain  - Difficulty breathing  - Severe nausea or vomiting  - Inability to tolerate food and liquids  - Passing out  - Skin becoming red around your wounds  - Change in mental status (confusion or lethargy)  - New numbness or weakness    Please return if you have any other emergent concerns.  Additional Information:  Your vital signs today were: BP (!) 145/86 (BP Location: Left Arm)    Pulse 74    Temp 98.5 F (36.9 C) (Oral)    Resp 18    Ht 5' 6.5" (1.689 m)    Wt 90.7 kg (200 lb)    SpO2 100%    BMI 31.80 kg/m  If your blood pressure (BP) was elevated above 135/85 this visit, please have this repeated by your doctor within one month. ---------------

## 2017-07-13 LAB — URINE CULTURE

## 2018-08-12 MED FILL — KIONEX 15 GM/60 ML SUS: 15 | 1 days supply | Qty: 120 | Fill #0

## 2018-12-10 ENCOUNTER — Emergency Department (HOSPITAL_BASED_OUTPATIENT_CLINIC_OR_DEPARTMENT_OTHER)
Admission: EM | Admit: 2018-12-10 | Discharge: 2018-12-11 | Disposition: A | Discharge status: Home / Self Care | Payer: Medicare Other | Attending: Emergency Medicine | Admitting: Emergency Medicine

## 2018-12-10 ENCOUNTER — Encounter (HOSPITAL_BASED_OUTPATIENT_CLINIC_OR_DEPARTMENT_OTHER): Payer: Self-pay | Admitting: Emergency Medicine

## 2018-12-10 ENCOUNTER — Emergency Department (HOSPITAL_BASED_OUTPATIENT_CLINIC_OR_DEPARTMENT_OTHER): Payer: Medicare Other

## 2018-12-10 ENCOUNTER — Other Ambulatory Visit: Payer: Self-pay

## 2018-12-10 DIAGNOSIS — N182 Chronic kidney disease, stage 2 (mild): Secondary | ICD-10-CM | POA: Insufficient documentation

## 2018-12-10 DIAGNOSIS — I129 Hypertensive chronic kidney disease with stage 1 through stage 4 chronic kidney disease, or unspecified chronic kidney disease: Secondary | ICD-10-CM | POA: Diagnosis not present

## 2018-12-10 DIAGNOSIS — Z7982 Long term (current) use of aspirin: Secondary | ICD-10-CM | POA: Insufficient documentation

## 2018-12-10 DIAGNOSIS — Z79899 Other long term (current) drug therapy: Secondary | ICD-10-CM | POA: Insufficient documentation

## 2018-12-10 DIAGNOSIS — R079 Chest pain, unspecified: Secondary | ICD-10-CM | POA: Diagnosis not present

## 2018-12-10 LAB — BASIC METABOLIC PANEL
ANION GAP: 7 (ref 5–15)
BUN: 20 mg/dL (ref 8–23)
CALCIUM: 9 mg/dL (ref 8.9–10.3)
CHLORIDE: 104 mmol/L (ref 98–111)
CO2: 22 mmol/L (ref 22–32)
Creatinine, Ser: 1.65 mg/dL — ABNORMAL HIGH (ref 0.44–1.00)
GFR calc non Af Amer: 32 mL/min — ABNORMAL LOW (ref >60)
GFR, EST AFRICAN AMERICAN: 37 mL/min — AB (ref >60)
GLUCOSE: 117 mg/dL — AB (ref 70–99)
POTASSIUM: 4.1 mmol/L (ref 3.5–5.1)
Sodium: 133 mmol/L — ABNORMAL LOW (ref 135–145)

## 2018-12-10 LAB — CBC
HEMATOCRIT: 39.2 % (ref 36.0–46.0)
HEMOGLOBIN: 12.4 g/dL (ref 12.0–15.0)
MCH: 28.6 pg (ref 26.0–34.0)
MCHC: 31.6 g/dL (ref 30.0–36.0)
MCV: 90.5 fL (ref 80.0–100.0)
NRBC: 0 % (ref 0.0–0.2)
PLATELETS: 285 10*3/uL (ref 150–400)
RBC: 4.33 MIL/uL (ref 3.87–5.11)
RDW: 13.2 % (ref 11.5–15.5)
WBC: 5.1 10*3/uL (ref 4.0–10.5)

## 2018-12-10 LAB — TROPONIN I: Troponin I: <0.03 ng/mL (ref <0.03)

## 2018-12-10 MED ORDER — LACTATED RINGERS IV BOLUS
1000.0000 mL | Freq: Once | INTRAVENOUS | Status: AC
  Administered 2018-12-10: 1000 mL via INTRAVENOUS

## 2018-12-10 NOTE — Discharge Instructions (Addendum)
Return if you are having any problems. 

## 2018-12-10 NOTE — ED Triage Notes (Signed)
Pt states she has a problem with her Potassium and takes Valtesa. States something is wrong. States she felt tingling in her head and "a little funny in her chest". [facility]

## 2018-12-10 NOTE — ED Provider Notes (Signed)
[facility] EMERGENCY DEPARTMENT Provider Note   CSN: [id number] Arrival date & time: 12/10/18  2106     History   Chief Complaint Chief Complaint  Patient presents with  . Chest Pain    HPI Christina Vang is a 68 y.o. female.  HPI  68 year old female presents with feeling like her potassium and/or sodium are off.  She states that starting 2 days ago she has been feeling twinkling in her head.  She states not quite dizziness or lightheadedness but somewhat.  She does not feel like she is about to fall over or pass out.  She is had this before when her sodium and potassium have been off.  She is currently on Veltassa for hyperkalemia.  She has been compliant with all of her medicines.  She has chronic tingling in her toes and hands that she states usually goes away with her fibromyalgia medicine but is currently present.  She has also developed some chest pressure.  She states is not really a pain but a discomfort.  Is been coming and going since 3 PM today.  No shortness of breath.  No recent illness such as vomiting or diarrhea.  Past Medical History:  Diagnosis Date  . Acute renal disease   . Anemia   . Anxiety   . Arthritis   . Atrial fibrillation (HCC)   . Bronchial asthma   . Chronic back pain   . Chronic kidney disease (CKD), stage II (mild)   . Daily headache    "recently" (07/31/2014)  . Depression   . Diverticulitis   . Fibromyalgia   . GERD (gastroesophageal reflux disease)   . H/O hiatal hernia   . High cholesterol   . Hyperparathyroidism (HCC)   . Hypertension   . Mitral valve prolapse   . Osteopenia   . Pneumonia X 2    Patient Active Problem List   Diagnosis Date Noted  . Sepsis (HCC) 12/18/2015  . Normocytic anemia 12/18/2015  . Acute diverticulitis 12/17/2015  . Overdose 07/31/2014  . Dizziness 08/19/2013  . Hypertensive urgency 08/19/2013  . Fibromyalgia   . Hyperparathyroidism (HCC)   . GERD (gastroesophageal reflux disease)   .  Hyperthyroidism without goitre-TSH 0.22 11/16/2011  . Steatosis of liver-per [facility] 12.30 11/16/2011  . Hypercalcemia 11/15/2011  . Hypokalemia 11/15/2011  . Headache(784.0) 11/15/2011  . HTN (hypertension) 11/15/2011    Past Surgical History:  Procedure Laterality Date  . CARDIAC CATHETERIZATION     "several"  . CHOLECYSTECTOMY  ~ 2008  . HERNIA REPAIR    . KNEE ARTHROSCOPY Left 1990's  . LIVER BIOPSY  1990's   "fatty liver"  . NISSEN FUNDOPLICATION  1980's  . TONSILLECTOMY AND ADENOIDECTOMY  ~ 1960  . 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 TUBAL LIGATION  1972  . VAGINAL HYSTERECTOMY  1980's     OB History   No obstetric history on file.      Home Medications    Prior to Admission medications   Medication Sig Start Date End Date Taking? Authorizing Provider  albuterol (PROVENTIL HFA;VENTOLIN HFA) 108 (90 BASE) MCG/ACT inhaler Inhale 2 puffs into the lungs 2 (two) times daily as needed for wheezing or shortness of breath.    [provider]  amLODipine (NORVASC) 5 MG tablet Take 1 tablet (5 mg total) by mouth daily. Patient taking differently: Take 5 mg by mouth 2 (two) times daily.  12/21/15   Catarina Hartshorn, MD  aspirin EC 81 MG tablet Take 81 mg by mouth daily.    [provider]  atorvastatin (LIPITOR) 20 MG tablet Take 20 mg by mouth at bedtime.     [provider]  cetirizine (ZYRTEC) 10 MG tablet Take 10 mg by mouth daily.    [provider]  cholecalciferol (VITAMIN D) 1000 units tablet Take 1,000 Units by mouth daily.    [provider]  ciprofloxacin (CIPRO) 500 MG tablet Take 1 tablet (500 mg total) by mouth every 12 (twelve) hours. 08/18/16   Kirichenko, Tatyana, PA-C  cyclobenzaprine (FLEXERIL) 10 MG tablet Take 10 mg by mouth 3 (three) times daily as needed for muscle spasms.    [provider]  diclofenac sodium (VOLTAREN) 1 % GEL Apply 1 application topically 2 (two) times  daily as needed (pain).    [provider]  DULoxetine (CYMBALTA) 60 MG capsule Take 60 mg by mouth daily as needed (pain).     [provider]  eplerenone (INSPRA) 50 MG tablet Take 100 mg by mouth 2 (two) times daily. 09/25/15   [provider]  eszopiclone (LUNESTA) 2 MG TABS tablet Take 2 mg by mouth at bedtime as needed for sleep. Take immediately before bedtime    [provider]  gabapentin (NEURONTIN) 100 MG capsule Take 600 mg by mouth at bedtime.  11/05/15   [provider]  HYDROcodone-acetaminophen (NORCO) 5-325 MG tablet Take 1 tablet by mouth every 6 (six) hours as needed for moderate pain. 08/18/16   Kirichenko, Tatyana, PA-C  labetalol (NORMODYNE) 200 MG tablet Take 1 tablet (200 mg total) by mouth 2 (two) times daily. 12/21/15 06/10/16  Catarina Hartshornat, David, MD  labetalol (NORMODYNE) 300 MG tablet Take 300 mg by mouth 2 (two) times daily.    [provider]  Linaclotide (LINZESS) 290 MCG CAPS capsule Take 290 mcg by mouth daily.    [provider]  meloxicam (MOBIC) 15 MG tablet Take 15 mg by mouth daily.    [provider]  metroNIDAZOLE (FLAGYL) 500 MG tablet Take 1 tablet (500 mg total) by mouth 3 (three) times daily. 08/18/16   Kirichenko, Lemont Fillersatyana, PA-C  omega-3 acid ethyl esters (LOVAZA) 1 G capsule Take 1 g by mouth 2 (two) times daily.    [provider]  omeprazole (PRILOSEC) 40 MG capsule Take 40 mg by mouth daily.    [provider]  promethazine (PHENERGAN) 25 MG tablet Take 1 tablet (25 mg total) by mouth every 6 (six) hours as needed for nausea or vomiting. 08/18/16   Jaynie CrumbleKirichenko, Tatyana, PA-C    Family History Family History  Problem Relation Age of Onset  . Hypertension Mother   . Stroke Mother   . Diabetes Mother   . Cancer Mother   . Hypertension Father   . Stroke Father   . Lung cancer Father   . Diabetes Sister   . Diabetes Brother   . Pancreatic cancer Paternal Aunt     Social  History Social History   Tobacco Use  . Smoking status: Never Smoker  . Smokeless tobacco: Never Used  Substance Use Topics  . Alcohol use: No  . Drug use: No     Allergies   Augmentin [amoxicillin-pot clavulanate]; Codeine; Meloxicam; and Tetracyclines & related   Review of Systems Review of Systems  Constitutional: Negative for fever.  Respiratory: Negative for shortness of breath.   Cardiovascular: Positive for chest pain.  Gastrointestinal: Negative for abdominal pain, diarrhea and vomiting.  Neurological: Positive for dizziness. Negative for weakness, numbness and headaches.  All other systems reviewed and are negative.    Physical Exam Updated Vital Signs BP 128/89   Pulse 80   Temp 98.6 F (37 C) (Oral)   Resp 17   Ht 5\' 6"  (1.676 m)   Wt 86.2 kg   SpO2 100%   BMI 30.67 kg/m   Physical Exam Vitals signs and nursing note reviewed.  Constitutional:      General: She is not in acute distress.    Appearance: She is well-developed. She is obese. She is not ill-appearing or diaphoretic.  HENT:     Head: Normocephalic and atraumatic.     Right Ear: External ear normal.     Left Ear: External ear normal.     Nose: Nose normal.  Eyes:     General:        Right eye: No discharge.        Left eye: No discharge.  Cardiovascular:     Rate and Rhythm: Normal rate and regular rhythm.     Heart sounds: Normal heart sounds.  Pulmonary:     Effort: Pulmonary effort is normal.     Breath sounds: Normal breath sounds.  Abdominal:     Palpations: Abdomen is soft.     Tenderness: There is no abdominal tenderness.  Skin:    General: Skin is warm and dry.  Neurological:     Mental Status: She is alert.     Comments: CN 3-12 grossly intact. 5/5 strength in all 4 extremities. Grossly normal sensation. Normal finger to nose.   Psychiatric:        Mood and Affect: Mood is not anxious.      ED Treatments / Results  Labs (all labs ordered are listed, but only  abnormal results are displayed) Labs Reviewed  BASIC METABOLIC PANEL - Abnormal; Notable for the following components:      Result Value   Sodium 133 (*)    Glucose, Bld 117 (*)    Creatinine, Ser 1.65 (*)    GFR calc non Af Amer 32 (*)    GFR calc Af Amer 37 (*)    All other components within normal limits  CBC  TROPONIN I  TROPONIN I    EKG EKG Interpretation  Date/Time:  Friday December 10 2018 22:09:11 EST Ventricular Rate:  79 PR Interval:    QRS Duration: 84 QT Interval:  367 QTC Calculation: 421 R Axis:   22 Text Interpretation:  Sinus rhythm Low voltage, precordial leads Abnormal R-wave progression, early transition Left ventricular hypertrophy besides increased HR, no significant change since 2015 Confirmed by Pricilla LovelessGoldston, Devlyn Retter [phone]([id number]) on 12/10/2018 10:13:21 PM   Radiology Dg Chest 2 View  Result Date: 12/10/2018 CLINICAL DATA:  Chest discomfort EXAM: CHEST - 2 VIEW COMPARISON:  12/30/2016 FINDINGS: Cardiac shadow is within normal limits. Bravo monitoring device is noted in the mid esophagus. Aortic calcifications are seen. The lungs are clear. No acute bony abnormality is noted. Changes about the gastroesophageal junction are noted consistent with the given clinical history. IMPRESSION: No acute abnormality noted. Electronically Signed   By: Alcide CleverMark  Lukens M.D.   On: 12/10/2018 21:49    Procedures Procedures (including critical care time)  Medications Ordered in ED Medications  lactated ringers bolus 1,000 mL (1,000 mLs Intravenous New Bag/Given 12/10/18 2255)     Initial Impression / Assessment and Plan / ED Course  I have reviewed the triage vital signs and  the nursing notes.  Pertinent labs & imaging results that were available during my care of the patient were reviewed by me and considered in my medical decision making (see chart for details).     Patient has vague symptoms but no localizing numbness or hard neurologic findings.  She has some vague chest  pressure that is mild and intermittent and not currently present.  My suspicion for ACS is pretty low.  Her first troponin is negative and I think it is reasonable to get a second and if negative she could likely be discharged.  As far as her electrolytes she has minimal hyponatremia and her potassium is normal.  Her creatinine is 1.65 which is up from about 1.2 in December per her last nephrology note.  She will be given an IV fluid bolus and otherwise can likely be discharged if the second troponin is negative.  Care transferred to Dr. Preston Fleeting.  Final Clinical Impressions(s) / ED Diagnoses   Final diagnoses:  Nonspecific chest pain    ED Discharge Orders    None       Pricilla Loveless, MD 12/10/18 2329

## 2018-12-11 DIAGNOSIS — R079 Chest pain, unspecified: Secondary | ICD-10-CM | POA: Diagnosis not present

## 2018-12-11 LAB — URINALYSIS, ROUTINE W REFLEX MICROSCOPIC
Bilirubin Urine: NEGATIVE
GLUCOSE, UA: 100 mg/dL — AB
HGB URINE DIPSTICK: NEGATIVE
KETONES UR: NEGATIVE mg/dL
Leukocytes, UA: NEGATIVE
Nitrite: NEGATIVE
PH: 6.5 (ref 5.0–8.0)
Protein, ur: NEGATIVE mg/dL
Specific Gravity, Urine: 1.01 (ref 1.005–1.030)

## 2018-12-11 LAB — TROPONIN I

## 2018-12-11 NOTE — ED Provider Notes (Signed)
Care assumed from Dr. Criss AlvineGoldston, patient presented with chest pain and negative Nirschl work-up, pending delta troponin.  Repeat troponin is normal.  Patient continues to be pain-free.  She is discharged with instructions to follow-up with PCP.  Return precautions discussed.   Dione BoozeGlick, Marqui Formby, MD 12/11/18 [phone]

## 2021-03-03 ENCOUNTER — Emergency Department (HOSPITAL_BASED_OUTPATIENT_CLINIC_OR_DEPARTMENT_OTHER)
Admission: EM | Admit: 2021-03-03 | Discharge: 2021-03-03 | Disposition: A | Discharge status: Home / Self Care | Payer: Medicare Other | Attending: Emergency Medicine | Admitting: Emergency Medicine

## 2021-03-03 ENCOUNTER — Encounter (HOSPITAL_BASED_OUTPATIENT_CLINIC_OR_DEPARTMENT_OTHER): Payer: Self-pay | Admitting: Emergency Medicine

## 2021-03-03 ENCOUNTER — Emergency Department (HOSPITAL_BASED_OUTPATIENT_CLINIC_OR_DEPARTMENT_OTHER): Payer: Medicare Other

## 2021-03-03 ENCOUNTER — Other Ambulatory Visit: Payer: Self-pay

## 2021-03-03 DIAGNOSIS — I129 Hypertensive chronic kidney disease with stage 1 through stage 4 chronic kidney disease, or unspecified chronic kidney disease: Secondary | ICD-10-CM | POA: Diagnosis not present

## 2021-03-03 DIAGNOSIS — K5732 Diverticulitis of large intestine without perforation or abscess without bleeding: Secondary | ICD-10-CM | POA: Diagnosis not present

## 2021-03-03 DIAGNOSIS — K219 Gastro-esophageal reflux disease without esophagitis: Secondary | ICD-10-CM | POA: Insufficient documentation

## 2021-03-03 DIAGNOSIS — R1032 Left lower quadrant pain: Secondary | ICD-10-CM | POA: Diagnosis present

## 2021-03-03 DIAGNOSIS — N182 Chronic kidney disease, stage 2 (mild): Secondary | ICD-10-CM | POA: Insufficient documentation

## 2021-03-03 DIAGNOSIS — K5792 Diverticulitis of intestine, part unspecified, without perforation or abscess without bleeding: Secondary | ICD-10-CM

## 2021-03-03 DIAGNOSIS — J45909 Unspecified asthma, uncomplicated: Secondary | ICD-10-CM | POA: Insufficient documentation

## 2021-03-03 LAB — CBC WITH DIFFERENTIAL/PLATELET
Abs Immature Granulocytes: 0.01 10*3/uL (ref 0.00–0.07)
Basophils Absolute: 0.1 10*3/uL (ref 0.0–0.1)
Basophils Relative: 1 %
Eosinophils Absolute: 0.1 10*3/uL (ref 0.0–0.5)
Eosinophils Relative: 2 %
HCT: 33.6 % — ABNORMAL LOW (ref 36.0–46.0)
Hemoglobin: 11.1 g/dL — ABNORMAL LOW (ref 12.0–15.0)
Immature Granulocytes: 0 %
Lymphocytes Relative: 31 %
Lymphs Abs: 1.7 10*3/uL (ref 0.7–4.0)
MCH: 28.8 pg (ref 26.0–34.0)
MCHC: 33 g/dL (ref 30.0–36.0)
MCV: 87.3 fL (ref 80.0–100.0)
Monocytes Absolute: 0.6 10*3/uL (ref 0.1–1.0)
Monocytes Relative: 11 %
Neutro Abs: 3.1 10*3/uL (ref 1.7–7.7)
Neutrophils Relative %: 55 %
Platelets: 274 10*3/uL (ref 150–400)
RBC: 3.85 MIL/uL — ABNORMAL LOW (ref 3.87–5.11)
RDW: 13.9 % (ref 11.5–15.5)
WBC: 5.6 10*3/uL (ref 4.0–10.5)
nRBC: 0 % (ref 0.0–0.2)

## 2021-03-03 LAB — COMPREHENSIVE METABOLIC PANEL
ALT: 14 U/L (ref 0–44)
AST: 18 U/L (ref 15–41)
Albumin: 3.7 g/dL (ref 3.5–5.0)
Alkaline Phosphatase: 89 U/L (ref 38–126)
Anion gap: 9 (ref 5–15)
BUN: 15 mg/dL (ref 8–23)
CO2: 22 mmol/L (ref 22–32)
Calcium: 9 mg/dL (ref 8.9–10.3)
Chloride: 97 mmol/L — ABNORMAL LOW (ref 98–111)
Creatinine, Ser: 1.23 mg/dL — ABNORMAL HIGH (ref 0.44–1.00)
GFR, Estimated: 47 mL/min — ABNORMAL LOW (ref >60)
Glucose, Bld: 92 mg/dL (ref 70–99)
Potassium: 5 mmol/L (ref 3.5–5.1)
Sodium: 128 mmol/L — ABNORMAL LOW (ref 135–145)
Total Bilirubin: 0.5 mg/dL (ref 0.3–1.2)
Total Protein: 7.3 g/dL (ref 6.5–8.1)

## 2021-03-03 LAB — LIPASE, BLOOD: Lipase: 24 U/L (ref 11–51)

## 2021-03-03 MED ORDER — FENTANYL CITRATE (PF) 100 MCG/2ML IJ SOLN
50.0000 ug | Freq: Once | INTRAMUSCULAR | Status: AC
  Administered 2021-03-03: 50 ug via INTRAVENOUS
  Filled 2021-03-03: qty 2

## 2021-03-03 MED ORDER — ONDANSETRON HCL 4 MG/2ML IJ SOLN
4.0000 mg | Freq: Once | INTRAMUSCULAR | Status: AC
  Administered 2021-03-03: 4 mg via INTRAVENOUS
  Filled 2021-03-03: qty 2

## 2021-03-03 MED ORDER — CIPROFLOXACIN HCL 500 MG PO TABS: 500.0000 mg | ORAL_TABLET | Freq: Two times a day (BID) | ORAL | 0 refills | Status: AC

## 2021-03-03 MED ORDER — IOHEXOL 300 MG/ML  SOLN
100.0000 mL | Freq: Once | INTRAMUSCULAR | Status: AC | PRN
  Administered 2021-03-03: 100 mL via INTRAVENOUS

## 2021-03-03 MED ORDER — DICYCLOMINE HCL 20 MG PO TABS: 20.0000 mg | ORAL_TABLET | Freq: Three times a day (TID) | ORAL | 0 refills | Status: AC | PRN

## 2021-03-03 MED ORDER — METRONIDAZOLE 500 MG PO TABS: 500.0000 mg | ORAL_TABLET | Freq: Two times a day (BID) | ORAL | 0 refills | Status: AC

## 2021-03-03 NOTE — ED Provider Notes (Signed)
Emergency Department Provider Note   I have reviewed the triage vital signs and the nursing notes.   HISTORY  Chief Complaint Abdominal Pain (LLQ)   HPI Christina Vang is a 70 y.o. female with past medical history reviewed below including prior complicated diverticulitis presents to the emergency department with left lower quadrant abdominal pain and pressure.  Pain is worse with movement or touching the area.  Symptoms began yesterday.  Patient has an associated decreased appetite.  No vomiting or diarrhea.  No blood in the bowel movements.  No fevers or chills.  No chest pain or shortness of breath symptoms. Spoke with her PCP and was referred to the ED.   Past Medical History:  Diagnosis Date  . Acute renal disease   . Anemia   . Anxiety   . Arthritis   . Atrial fibrillation (HCC)   . Bronchial asthma   . Chronic back pain   . Chronic kidney disease (CKD), stage II (mild)   . Daily headache    "recently" (07/31/2014)  . Depression   . Diverticulitis   . Fibromyalgia   . GERD (gastroesophageal reflux disease)   . H/O hiatal hernia   . High cholesterol   . Hyperparathyroidism (HCC)   . Hypertension   . Mitral valve prolapse   . Osteopenia   . Pneumonia X 2    Patient Active Problem List   Diagnosis Date Noted  . Sepsis (HCC) 12/18/2015  . Normocytic anemia 12/18/2015  . Acute diverticulitis 12/17/2015  . Overdose 07/31/2014  . Dizziness 08/19/2013  . Hypertensive urgency 08/19/2013  . Fibromyalgia   . Hyperparathyroidism (HCC)   . GERD (gastroesophageal reflux disease)   . Hyperthyroidism without goitre-TSH 0.22 11/16/2011  . Steatosis of liver-per [facility] 12.30 11/16/2011  . Hypercalcemia 11/15/2011  . Hypokalemia 11/15/2011  . Headache(784.0) 11/15/2011  . HTN (hypertension) 11/15/2011    Past Surgical History:  Procedure Laterality Date  . CARDIAC CATHETERIZATION     "several"  . CHOLECYSTECTOMY  ~ 2008  . HERNIA REPAIR    . KNEE ARTHROSCOPY  Left 1990's  . LIVER BIOPSY  1990's   "fatty liver"  . NISSEN FUNDOPLICATION  1980's  . TONSILLECTOMY AND ADENOIDECTOMY  ~ 1960  . 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 TUBAL LIGATION  1972  . VAGINAL HYSTERECTOMY  1980's    Allergies Augmentin [amoxicillin-pot clavulanate], Codeine, Meloxicam, and Tetracyclines & related  Family History  Problem Relation Age of Onset  . Hypertension Mother   . Stroke Mother   . Diabetes Mother   . Cancer Mother   . Hypertension Father   . Stroke Father   . Lung cancer Father   . Diabetes Sister   . Diabetes Brother   . Pancreatic cancer Paternal Aunt     Social History Social History   Tobacco Use  . Smoking status: Never Smoker  . Smokeless tobacco: Never Used  Substance Use Topics  . Alcohol use: No  . Drug use: No    Review of Systems  Constitutional: No fever/chills Eyes: No visual changes. ENT: No sore throat. Cardiovascular: Denies chest pain. Respiratory: Denies shortness of breath. Gastrointestinal: Positive LLQ abdominal pain.  No nausea, no vomiting.  No diarrhea.  No constipation. Genitourinary: Negative for dysuria. Musculoskeletal: Negative for back pain. Skin: Negative for rash. Neurological: Negative for headaches, focal weakness or numbness.  10-point ROS otherwise negative.  ____________________________________________   PHYSICAL EXAM:  VITAL SIGNS: ED  Triage Vitals  Enc Vitals Group     BP 03/03/21 1440 (!) 141/68     Pulse Rate 03/03/21 1440 (!) 47     Resp 03/03/21 1440 18     Temp 03/03/21 1440 98.9 F (37.2 C)     Temp Source 03/03/21 1440 Oral     SpO2 03/03/21 1440 100 %   Constitutional: Alert and oriented. Well appearing and in no acute distress. Eyes: Conjunctivae are normal.  Head: Atraumatic. Nose: No congestion/rhinnorhea. Mouth/Throat: Mucous membranes are moist.  Neck: No stridor.   Cardiovascular: Normal rate, regular rhythm. Good  peripheral circulation. Grossly normal heart sounds.   Respiratory: Normal respiratory effort.  No retractions. Lungs CTAB. Gastrointestinal: Soft with focal LLQ tenderness. No rebound or guarding. Remaining abdominal quadrants are non-tender. No distention.  Musculoskeletal: No lower extremity tenderness nor edema. No gross deformities of extremities. Neurologic:  Normal speech and language. No gross focal neurologic deficits are appreciated.  Skin:  Skin is warm, dry and intact. No rash noted.   ____________________________________________   LABS (all labs ordered are listed, but only abnormal results are displayed)  Labs Reviewed  COMPREHENSIVE METABOLIC PANEL - Abnormal; Notable for the following components:      Result Value   Sodium 128 (*)    Chloride 97 (*)    Creatinine, Ser 1.23 (*)    GFR, Estimated 47 (*)    All other components within normal limits  CBC WITH DIFFERENTIAL/PLATELET - Abnormal; Notable for the following components:   RBC 3.85 (*)    Hemoglobin 11.1 (*)    HCT 33.6 (*)    All other components within normal limits  LIPASE, BLOOD   ____________________________________________  RADIOLOGY  CT Abdomen Pelvis W Contrast  Result Date: 03/03/2021 CLINICAL DATA:  Abdominal pain.  Nausea. EXAM: CT ABDOMEN AND PELVIS WITH CONTRAST TECHNIQUE: Multidetector CT imaging of the abdomen and pelvis was performed using the standard protocol following bolus administration of intravenous contrast. CONTRAST:  OMNIPAQUE IOHEXOL 300 MG/ML  SOLN COMPARISON:  CT abdomen pelvis 12/19/2018 FINDINGS: Lower chest: No acute abnormality. Hepatobiliary: Stable multiple liver lesions, majority of which are subcentimeter in size and may represent tiny cysts or biliary hamartomas. No new focal liver lesion. Gallbladder is surgically absent. Pancreas: Unremarkable. No pancreatic ductal dilatation or surrounding inflammatory changes. Spleen: Normal in size without focal abnormality.  Adrenals/Urinary Tract: Adrenal glands are unremarkable. The is stable exophytic hyperdensity in the upper pole the right kidney, possibly a hemorrhagic cyst. No renal calculi or hydronephrosis. Urinary bladder is unremarkable. Stomach/Bowel: Stomach is within normal limits. There are multiple colonic diverticula. There is bowel wall thickening and pericolonic fat stranding in the sigmoid colon. No distant free air to suggest perforation. No evidence of intra-abdominal abscess. No evidence of obstruction. Vascular/Lymphatic: Aortic atherosclerosis. No enlarged abdominal or pelvic lymph nodes. Reproductive: Status post hysterectomy. No adnexal masses. Other: Small fat containing ventral hernia (series 2, image 45), similar to prior. Musculoskeletal: Stable grade 1 anterolisthesis of L5 on S1. No new acute osseous abnormality. IMPRESSION: Acute uncomplicated sigmoid diverticulitis. Aortic Atherosclerosis (ICD10-I70.0). Electronically Signed   By: Emmaline Kluver M.D.   On: 03/03/2021 18:19    ____________________________________________   PROCEDURES  Procedure(s) performed:   Procedures  None ____________________________________________   INITIAL IMPRESSION / ASSESSMENT AND PLAN / ED COURSE  Pertinent labs & imaging results that were available during my care of the patient were reviewed by me and considered in my medical decision making (see chart for details).  Patient presents the emergency department with left lower quadrant abdominal pain with some focal tenderness.  Patient has history of complicated diverticulitis in the past.  Plan for labs and CT abdomen/pelvis. Patient with history of CKD. Will order w/ contrast for now but follow CMP of creatinine. Colitis also on the differential. Doubt renal stone or vascular pathology.   Labs and imaging reviewed. Uncomplicated diverticulitis on CT. Patient doing well here. Has Augmentin allergy. Has tolerated Cipro and Flagyl in the past and has  a GI doctor. Understands she will need to call GI in the AM for follow up appointment. Discussed black box warning and symptoms of Cipro. Mild hyponatremia but few if any symptoms. Can w/u as an outpatient.   At this time, I do not feel there is any life-threatening condition present. I have reviewed and discussed all results (EKG, imaging, lab, urine as appropriate), exam findings with patient. I have reviewed nursing notes and appropriate previous records.  I feel the patient is safe to be discharged home without further emergent workup. Discussed usual and customary return precautions. Patient and family (if present) verbalize understanding and are comfortable with this plan.  Patient will follow-up with their primary care provider. If they do not have a primary care provider, information for follow-up has been provided to them. All questions have been answered.  ____________________________________________  FINAL CLINICAL IMPRESSION(S) / ED DIAGNOSES  Final diagnoses:  Diverticulitis     MEDICATIONS GIVEN DURING THIS VISIT:  Medications  fentaNYL (SUBLIMAZE) injection 50 mcg (50 mcg Intravenous Given 03/03/21 1646)  ondansetron (ZOFRAN) injection 4 mg (4 mg Intravenous Given 03/03/21 1646)  iohexol (OMNIPAQUE) 300 MG/ML solution 100 mL (100 mLs Intravenous Contrast Given 03/03/21 1755)     Note:  This document was prepared using Dragon voice recognition software and may include unintentional dictation errors.  Alona Bene, MD, [facility] Emergency Medicine    Lynlee Stratton, Arlyss Repress, MD 03/04/21 [phone]

## 2021-03-03 NOTE — ED Triage Notes (Signed)
LLQ pain x 2 days , Hx diverticulitis , sts feels same. Nausea.

## 2021-03-03 NOTE — ED Triage Notes (Signed)
Emergency Medicine Provider Triage Evaluation Note  Christina Vang , a 70 y.o. female  was evaluated in triage.  Pt complains of abdominal pain.  Pain began yesterday morning and has progressively worsened since then.  Patient endorses decreased oral intake due to her pain.  Patient reports that she had nausea yesterday but denies any at present.  No results of emesis.  Patient has history of diverticulitis.  Reports that she has been hospitalized previously but denies any history of perforations or abscess formation.  Denies any fevers, chills, constipation, diarrhea, blood in stool, melena, dysuria, hematuria, vaginal bleeding, vaginal pain, vaginal discharge.    Review of Systems  Positive: Left lower quadrant abdominal pain, nausea, decreased appetite Negative: Fever, chills, vomiting, blood in stool, melena, dysuria, hematuria, vaginal bleeding, vaginal pain, vaginal discharge  Physical Exam  BP (!) 141/68 (BP Location: Left Arm)   Pulse (!) 47   Temp 98.9 F (37.2 C) (Oral)   Resp 18   SpO2 100%  Gen:   Awake, no distress   HEENT:  Atraumatic  Resp:  Normal effort  Cardiac:  Bradycardia at rate of 47 Abd:   Nondistended, soft, tenderness to left lower quadrant MSK:   Moves extremities without difficulty  Neuro:  Speech clear   Medical Decision Making  Medically screening exam initiated at 3:00 PM.  Appropriate orders placed.  Loney Loh was informed that the remainder of the evaluation will be completed by another provider, this initial triage assessment does not replace that evaluation, and the importance of remaining in the ED until their evaluation is complete.  Clinical Impression   Urinalysis, CMP, CBC, lipase ordered.  We will add CT imaging of abdomen pelvis to be completed once creatinine is obtained.  The patient appears stable so that the remainder of the work up may be completed by another provider.      Haskel Schroeder, [place] 03/03/21 1502

## 2021-03-03 NOTE — Discharge Instructions (Signed)
You were seen in the emerge department today with abdominal pain.  We found some evidence of diverticulitis and are starting you on Cipro and Flagyl.  Please take for the next week and call your GI doctor tomorrow morning to schedule a follow-up appointment.  If you develop worsening pain, fever, inability to take your pills please return to the emergency department for reevaluation.

## 2021-03-03 NOTE — ED Notes (Signed)
Hard stick unable to obtain blood sample in triage

## 2022-03-14 ENCOUNTER — Other Ambulatory Visit: Payer: Self-pay

## 2022-03-14 ENCOUNTER — Emergency Department (HOSPITAL_BASED_OUTPATIENT_CLINIC_OR_DEPARTMENT_OTHER)
Admission: EM | Admit: 2022-03-14 | Discharge: 2022-03-15 | Disposition: A | Discharge status: Other Acute Inpatient Hospital | Payer: Medicare Other | Attending: Emergency Medicine | Admitting: Emergency Medicine

## 2022-03-14 ENCOUNTER — Encounter (HOSPITAL_BASED_OUTPATIENT_CLINIC_OR_DEPARTMENT_OTHER): Payer: Self-pay

## 2022-03-14 DIAGNOSIS — N183 Chronic kidney disease, stage 3 unspecified: Secondary | ICD-10-CM | POA: Diagnosis not present

## 2022-03-14 DIAGNOSIS — E876 Hypokalemia: Secondary | ICD-10-CM | POA: Insufficient documentation

## 2022-03-14 DIAGNOSIS — Z7982 Long term (current) use of aspirin: Secondary | ICD-10-CM | POA: Insufficient documentation

## 2022-03-14 DIAGNOSIS — E871 Hypo-osmolality and hyponatremia: Secondary | ICD-10-CM | POA: Insufficient documentation

## 2022-03-14 DIAGNOSIS — N179 Acute kidney failure, unspecified: Secondary | ICD-10-CM | POA: Diagnosis not present

## 2022-03-14 DIAGNOSIS — R5383 Other fatigue: Secondary | ICD-10-CM | POA: Diagnosis present

## 2022-03-14 LAB — CBC WITH DIFFERENTIAL/PLATELET
Abs Immature Granulocytes: 0.02 10*3/uL (ref 0.00–0.07)
Basophils Absolute: 0 10*3/uL (ref 0.0–0.1)
Basophils Relative: 1 %
Eosinophils Absolute: 0.1 10*3/uL (ref 0.0–0.5)
Eosinophils Relative: 1 %
HCT: 36.8 % (ref 36.0–46.0)
Hemoglobin: 13.3 g/dL (ref 12.0–15.0)
Immature Granulocytes: 0 %
Lymphocytes Relative: 37 %
Lymphs Abs: 1.9 10*3/uL (ref 0.7–4.0)
MCH: 28.5 pg (ref 26.0–34.0)
MCHC: 36.1 g/dL — ABNORMAL HIGH (ref 30.0–36.0)
MCV: 78.8 fL — ABNORMAL LOW (ref 80.0–100.0)
Monocytes Absolute: 0.6 10*3/uL (ref 0.1–1.0)
Monocytes Relative: 13 %
Neutro Abs: 2.4 10*3/uL (ref 1.7–7.7)
Neutrophils Relative %: 48 %
Platelets: 294 10*3/uL (ref 150–400)
RBC: 4.67 MIL/uL (ref 3.87–5.11)
RDW: 13.2 % (ref 11.5–15.5)
WBC: 5.1 10*3/uL (ref 4.0–10.5)
nRBC: 0 % (ref 0.0–0.2)

## 2022-03-14 MED ORDER — POTASSIUM CHLORIDE CRYS ER 20 MEQ PO TBCR
40.0000 meq | EXTENDED_RELEASE_TABLET | Freq: Once | ORAL | Status: AC
Start: 2022-03-14 — End: 2022-03-14
  Administered 2022-03-14: 40 meq via ORAL
  Filled 2022-03-14: qty 2

## 2022-03-14 MED ORDER — SODIUM CHLORIDE 0.9 % IV BOLUS
1000.0000 mL | Freq: Once | INTRAVENOUS | Status: AC
Start: 2022-03-14 — End: 2022-03-15
  Administered 2022-03-14: 1000 mL via INTRAVENOUS

## 2022-03-14 MED ORDER — POTASSIUM CHLORIDE ER 20 MEQ PO TBCR: 20.0000 meq | EXTENDED_RELEASE_TABLET | Freq: Every day | ORAL | 0 refills | Status: AC

## 2022-03-14 MED ORDER — POTASSIUM CHLORIDE 10 MEQ/100ML IV SOLN
10.0000 meq | Freq: Once | INTRAVENOUS | Status: AC
  Administered 2022-03-14: 10 meq via INTRAVENOUS
  Filled 2022-03-14: qty 100

## 2022-03-14 NOTE — ED Provider Notes (Signed)
Plan to f/u on labs, reassess, possible d/c home ?  ?Zadie Rhine, MD ?03/14/22 2350 ? ?

## 2022-03-14 NOTE — ED Triage Notes (Signed)
Stopped taking Lyrica 3 weeks ago because "I just didn't want to take it anymore." I started feeling bad this week. Feeling disoriented and tingly - like when my sodium and potassium get low.  ? ?Pt had outpatient lab work done today. Sent for abnormal lab values.  ?

## 2022-03-14 NOTE — ED Provider Notes (Signed)
?[facility] EMERGENCY DEPARTMENT ?Provider Note ? ? ?CSN: [id number] ?Arrival date & time: 03/14/22  2108 ? ?  ? ?History ? ?Chief Complaint  ?Patient presents with  ? Abnormal Labs  ? ? ?Christina Vang is a 71 y.o. female. ? ?Patient is a 71 year old female with past medical history of anemia, hyperlipidemia, chronic kidney disease stage III presenting for complaints of abnormal labs.  Patient she was feeling abnormal the last 2 days, fatigued, and oriented.  States she followed with her PCP and had lab studies at 4 PM this afternoon.  States she was called for an abnormal potassium level of 2.6 and creatinine of 1.6.  States she stopped her Lyrica 3 weeks ago and has had some creased appetite and decreased oral intake.  Admits to dehydration.  Denies any nausea, vomiting, diarrhea. ? ?The history is provided by the patient. No language interpreter was used.  ? ?  ? ?Home Medications ?Prior to Admission medications   ?Medication Sig Start Date End Date Taking? Authorizing Provider  ?albuterol (PROVENTIL HFA;VENTOLIN HFA) 108 (90 BASE) MCG/ACT inhaler Inhale 2 puffs into the lungs 2 (two) times daily as needed for wheezing or shortness of breath.    [provider]  ?amLODipine (NORVASC) 5 MG tablet Take 1 tablet (5 mg total) by mouth daily. ?Patient taking differently: Take 5 mg by mouth 2 (two) times daily.  12/21/15   Orson Eva, MD  ?aspirin EC 81 MG tablet Take 81 mg by mouth daily.    [provider]  ?atorvastatin (LIPITOR) 20 MG tablet Take 20 mg by mouth at bedtime.     [provider]  ?cetirizine (ZYRTEC) 10 MG tablet Take 10 mg by mouth daily.    [provider]  ?cholecalciferol (VITAMIN D) 1000 units tablet Take 1,000 Units by mouth daily.    [provider]  ?cyclobenzaprine (FLEXERIL) 10 MG tablet Take 10 mg by mouth 3 (three) times daily as needed for muscle spasms.    [provider]  ?diclofenac sodium (VOLTAREN) 1 % GEL Apply 1  application topically 2 (two) times daily as needed (pain).    [provider]  ?dicyclomine (BENTYL) 20 MG tablet Take 1 tablet (20 mg total) by mouth 3 (three) times daily as needed for spasms. 03/03/21   Long, Wonda Olds, MD  ?DULoxetine (CYMBALTA) 60 MG capsule Take 60 mg by mouth daily as needed (pain).     [provider]  ?eplerenone (INSPRA) 50 MG tablet Take 100 mg by mouth 2 (two) times daily. 09/25/15   [provider]  ?eszopiclone (LUNESTA) 2 MG TABS tablet Take 2 mg by mouth at bedtime as needed for sleep. Take immediately before bedtime    [provider]  ?gabapentin (NEURONTIN) 100 MG capsule Take 600 mg by mouth at bedtime.  11/05/15   [provider]  ?HYDROcodone-acetaminophen (NORCO) 5-325 MG tablet Take 1 tablet by mouth every 6 (six) hours as needed for moderate pain. 08/18/16   Kirichenko, Lahoma Rocker, PA-C  ?labetalol (NORMODYNE) 200 MG tablet Take 1 tablet (200 mg total) by mouth 2 (two) times daily. 12/21/15 06/10/16  Orson Eva, MD  ?labetalol (NORMODYNE) 300 MG tablet Take 300 mg by mouth 2 (two) times daily.    [provider]  ?Linaclotide Rolan Lipa) 290 MCG CAPS capsule Take 290 mcg by mouth daily.    [provider]  ?meloxicam (MOBIC) 15 MG tablet Take 15 mg by mouth daily.    [provider]  ?  omega-3 acid ethyl esters (LOVAZA) 1 G capsule Take 1 g by mouth 2 (two) times daily.    [provider]  ?omeprazole (PRILOSEC) 40 MG capsule Take 40 mg by mouth daily.    [provider]  ?promethazine (PHENERGAN) 25 MG tablet Take 1 tablet (25 mg total) by mouth every 6 (six) hours as needed for nausea or vomiting. 08/18/16   Jeannett Senior, PA-C  ?   ? ?Allergies    ?Augmentin [amoxicillin-pot clavulanate], Codeine, Meloxicam, and Tetracyclines & related   ? ?Review of Systems   ?Review of Systems  ?Constitutional:  Positive for fatigue. Negative for chills and fever.  ?HENT:  Negative for ear pain and sore  throat.   ?Eyes:  Negative for pain and visual disturbance.  ?Respiratory:  Positive for chest tightness. Negative for cough and shortness of breath.   ?Cardiovascular:  Negative for chest pain and palpitations.  ?Gastrointestinal:  Positive for nausea. Negative for abdominal pain and vomiting.  ?Genitourinary:  Negative for dysuria and hematuria.  ?Musculoskeletal:  Negative for arthralgias and back pain.  ?Skin:  Negative for color change and rash.  ?Neurological:  Negative for seizures and syncope.  ?All other systems reviewed and are negative. ? ?Physical Exam ?Updated Vital Signs ?BP 116/69   Pulse 64   Temp 98.7 ?F (37.1 ?C) (Oral)   Resp 18   Ht 5' 6.5" (1.689 m)   Wt 86.1 kg   SpO2 99%   BMI 30.18 kg/m?  ?Physical Exam ?Vitals and nursing note reviewed.  ?Constitutional:   ?   General: She is not in acute distress. ?   Appearance: She is well-developed.  ?HENT:  ?   Head: Normocephalic and atraumatic.  ?Eyes:  ?   Conjunctiva/sclera: Conjunctivae normal.  ?Cardiovascular:  ?   Rate and Rhythm: Normal rate and regular rhythm.  ?   Heart sounds: No murmur heard. ?Pulmonary:  ?   Effort: Pulmonary effort is normal. No respiratory distress.  ?   Breath sounds: Normal breath sounds.  ?Abdominal:  ?   Palpations: Abdomen is soft.  ?   Tenderness: There is no abdominal tenderness.  ?Musculoskeletal:     ?   General: No swelling.  ?   Cervical back: Neck supple.  ?Skin: ?   General: Skin is warm and dry.  ?   Capillary Refill: Capillary refill takes less than 2 seconds.  ?Neurological:  ?   Mental Status: She is alert.  ?Psychiatric:     ?   Mood and Affect: Mood normal.  ? ? ?ED Results / Procedures / Treatments   ?Labs ?(all labs ordered are listed, but only abnormal results are displayed) ?Labs Reviewed  ?URINALYSIS, ROUTINE W REFLEX MICROSCOPIC  ?CBC WITH DIFFERENTIAL/PLATELET  ?BASIC METABOLIC PANEL  ?MAGNESIUM  ?TROPONIN I (HIGH SENSITIVITY)  ? ? ?EKG ?None ? ?Radiology ?No results  found. ? ?Procedures ?Procedures  ? ? ?Medications Ordered in ED ?Medications  ?sodium chloride 0.9 % bolus 1,000 mL (has no administration in time range)  ?potassium chloride SA (KLOR-CON M) CR tablet 40 mEq (has no administration in time range)  ?potassium chloride 10 mEq in 100 mL IVPB (has no administration in time range)  ? ? ?ED Course/ Medical Decision Making/ A&P ?  ?                        ?Medical Decision Making ?Amount and/or Complexity of Data Reviewed ?Labs: ordered. ?ECG/medicine tests: ordered. ? ?  Risk ?Prescription drug management. ? ? ?51:36 PM ?71 year old female with past medical history of anemia, hyperlipidemia, chronic kidney disease stage III presenting for complaints of abnormal labs.  She is alert and oriented x3, no acute distress, afebrile, stable vital signs.  ? ?Chart review demonstrates labs from PCP office potassium 2.6 and creatinine 1.6 ? ?EKG today 03/14/2022 as interpreted by myself demonstrates no ST segment elevation or depression.  Stable QRS complexes without concerning EKG findings for hypokalemia.  No T wave inversions.  No QRS widening.  No U waves. ? ?Potassium 40 mEq orally ordered.  10 mEq IV ordered.  Signed out to oncoming physician while waiting laboratory studies. ? ? ? ? ? ? ? ?Final Clinical Impression(s) / ED Diagnoses ?Final diagnoses:  ?Hypokalemia  ?AKI (acute kidney injury) ([place])  ?Stage 3 chronic kidney disease, unspecified whether stage 3a or 3b CKD ([place])  ? ? ?Rx / DC Orders ?ED Discharge Orders   ? ? None  ? ?  ? ? ?  ?Lianne Cure, DO ?[phone] ? ?

## 2022-03-15 DIAGNOSIS — E876 Hypokalemia: Secondary | ICD-10-CM | POA: Diagnosis not present

## 2022-03-15 LAB — URINALYSIS, ROUTINE W REFLEX MICROSCOPIC
Bilirubin Urine: NEGATIVE
Glucose, UA: NEGATIVE mg/dL
Hgb urine dipstick: NEGATIVE
Ketones, ur: NEGATIVE mg/dL
Leukocytes,Ua: NEGATIVE
Nitrite: NEGATIVE
Protein, ur: NEGATIVE mg/dL
Specific Gravity, Urine: <=1.005 (ref 1.005–1.030)
pH: 6 (ref 5.0–8.0)

## 2022-03-15 LAB — BASIC METABOLIC PANEL
Anion gap: 13 (ref 5–15)
BUN: 34 mg/dL — ABNORMAL HIGH (ref 8–23)
CO2: 36 mmol/L — ABNORMAL HIGH (ref 22–32)
Calcium: 8.9 mg/dL (ref 8.9–10.3)
Chloride: 69 mmol/L — ABNORMAL LOW (ref 98–111)
Creatinine, Ser: 1.65 mg/dL — ABNORMAL HIGH (ref 0.44–1.00)
GFR, Estimated: 33 mL/min — ABNORMAL LOW (ref >60)
Glucose, Bld: 116 mg/dL — ABNORMAL HIGH (ref 70–99)
Potassium: 2.1 mmol/L — CL (ref 3.5–5.1)
Sodium: 118 mmol/L — CL (ref 135–145)

## 2022-03-15 LAB — TROPONIN I (HIGH SENSITIVITY)
Troponin I (High Sensitivity): 20 ng/L — ABNORMAL HIGH (ref <18)
Troponin I (High Sensitivity): 15 ng/L (ref <18)

## 2022-03-15 LAB — MAGNESIUM: Magnesium: 2.5 mg/dL — ABNORMAL HIGH (ref 1.7–2.4)

## 2022-03-15 MED ORDER — POTASSIUM CHLORIDE 10 MEQ/100ML IV SOLN
10.0000 meq | Freq: Once | INTRAVENOUS | Status: AC
  Administered 2022-03-15: 10 meq via INTRAVENOUS
  Filled 2022-03-15: qty 100

## 2022-03-15 MED ORDER — SODIUM CHLORIDE 0.9 % IV SOLN: INTRAVENOUS | Status: DC | PRN

## 2022-03-15 MED ORDER — SODIUM CHLORIDE 0.9 % IV BOLUS
1000.0000 mL | Freq: Once | INTRAVENOUS | Status: DC
Start: 2022-03-15 — End: 2022-03-15

## 2022-03-15 NOTE — ED Notes (Signed)
Pt ambulatory with steady gait to bathroom

## 2022-03-15 NOTE — ED Provider Notes (Signed)
Discussed with Dr. Keane Police at [facility] ?He accepts in transfer ? ?.Critical Care ?Performed by: Zadie Rhine, MD ?Authorized by: Zadie Rhine, MD  ? ?Critical care provider statement:  ?  Critical care time (minutes):  46 ?  Critical care start time:  03/15/2022 5:00 AM ?  Critical care end time:  03/15/2022 5:46 AM ?  Critical care time was exclusive of:  Separately billable procedures and treating other patients ?  Critical care was necessary to treat or prevent imminent or life-threatening deterioration of the following conditions:  Metabolic crisis, dehydration and renal failure ?  Critical care was time spent personally by me on the following activities:  Discussions with consultants, examination of patient, re-evaluation of patient's condition, ordering and review of laboratory studies, ordering and performing treatments and interventions and development of treatment plan with patient or surrogate ?  I assumed direction of critical care for this patient from another provider in my specialty: yes   ?  Care discussed with: admitting provider   ? ?  ?Zadie Rhine, MD ?03/15/22 [phone] ? ?

## 2022-03-15 NOTE — ED Provider Notes (Signed)
Labs reveal significant hyponatremia and hypokalemia, which appear worse than previous labs ?Patient also noted to have prolonged QT and U waves on EKG ?Overall patient is in no acute distress ?However she will need continued care in the hospital.  Patient preferred to stay in the [facility] system. ?After discussion with physicians at [facility], the only bed available is in [place].  Patient was initially resistant to being admitted to [facility], but after further discussion she prefers to stay and await for system.  She accepts transfer to [facility].  I am  still awaiting callback from physician at [facility] for admission ? ?  ?Zadie Rhine, MD ?03/15/22 [phone] ? ?

## 2022-03-18 ENCOUNTER — Encounter (HOSPITAL_BASED_OUTPATIENT_CLINIC_OR_DEPARTMENT_OTHER): Payer: Self-pay | Admitting: Urology

## 2022-03-18 ENCOUNTER — Other Ambulatory Visit: Payer: Self-pay

## 2022-03-18 ENCOUNTER — Emergency Department (HOSPITAL_BASED_OUTPATIENT_CLINIC_OR_DEPARTMENT_OTHER)
Admission: EM | Admit: 2022-03-18 | Discharge: 2022-03-18 | Disposition: A | Discharge status: Home / Self Care | Payer: Medicare Other | Attending: Emergency Medicine | Admitting: Emergency Medicine

## 2022-03-18 DIAGNOSIS — Z79899 Other long term (current) drug therapy: Secondary | ICD-10-CM | POA: Diagnosis not present

## 2022-03-18 DIAGNOSIS — Z7982 Long term (current) use of aspirin: Secondary | ICD-10-CM | POA: Insufficient documentation

## 2022-03-18 DIAGNOSIS — M7981 Nontraumatic hematoma of soft tissue: Secondary | ICD-10-CM | POA: Diagnosis not present

## 2022-03-18 DIAGNOSIS — T148XXA Other injury of unspecified body region, initial encounter: Secondary | ICD-10-CM

## 2022-03-18 NOTE — Discharge Instructions (Signed)
You were seen today for what appears to be a hematoma at the site of your Lovenox injections.  This should improve with time.  There is no intervention needed at this time.  Recommend follow-up with primary care as needed ?

## 2022-03-18 NOTE — ED Provider Notes (Signed)
?[facility] ?Provider Note ? ? ?CSN: [id number] ?Arrival date & time: 03/18/22  1612 ? ?  ? ?History ? ?Chief Complaint  ?Patient presents with  ? Mass  ? ? ?ROQUEL CARNATHAN is a 71 y.o. female.  Patient presents with a chief concern of a tender spot where she recently received Lovenox injections.  The patient was discharged from the hospital yesterday where she was admitted due to hyponatremia.  She was receiving Lovenox injections while in the hospital.  The patient states that while showering today she noticed a tender area around the site of the Lovenox injections.  Patient presents to the emergency department for evaluation of the tender site. ? ?HPI ? ?  ? ?Home Medications ?Prior to Admission medications   ?Medication Sig Start Date End Date Taking? Authorizing Provider  ?albuterol (PROVENTIL HFA;VENTOLIN HFA) 108 (90 BASE) MCG/ACT inhaler Inhale 2 puffs into the lungs 2 (two) times daily as needed for wheezing or shortness of breath.    [provider]  ?amLODipine (NORVASC) 5 MG tablet Take 1 tablet (5 mg total) by mouth daily. ?Patient taking differently: Take 5 mg by mouth 2 (two) times daily.  12/21/15   Orson Eva, MD  ?aspirin EC 81 MG tablet Take 81 mg by mouth daily.    [provider]  ?atorvastatin (LIPITOR) 20 MG tablet Take 20 mg by mouth at bedtime.     [provider]  ?cetirizine (ZYRTEC) 10 MG tablet Take 10 mg by mouth daily.    [provider]  ?cholecalciferol (VITAMIN D) 1000 units tablet Take 1,000 Units by mouth daily.    [provider]  ?cyclobenzaprine (FLEXERIL) 10 MG tablet Take 10 mg by mouth 3 (three) times daily as needed for muscle spasms.    [provider]  ?diclofenac sodium (VOLTAREN) 1 % GEL Apply 1 application topically 2 (two) times daily as needed (pain).    [provider]  ?dicyclomine (BENTYL) 20 MG tablet Take 1 tablet (20 mg total) by mouth 3 (three) times daily as needed  for spasms. 03/03/21   Long, Wonda Olds, MD  ?DULoxetine (CYMBALTA) 60 MG capsule Take 60 mg by mouth daily as needed (pain).     [provider]  ?eplerenone (INSPRA) 50 MG tablet Take 100 mg by mouth 2 (two) times daily. 09/25/15   [provider]  ?eszopiclone (LUNESTA) 2 MG TABS tablet Take 2 mg by mouth at bedtime as needed for sleep. Take immediately before bedtime    [provider]  ?gabapentin (NEURONTIN) 100 MG capsule Take 600 mg by mouth at bedtime.  11/05/15   [provider]  ?HYDROcodone-acetaminophen (NORCO) 5-325 MG tablet Take 1 tablet by mouth every 6 (six) hours as needed for moderate pain. 08/18/16   Kirichenko, Lahoma Rocker, PA-C  ?labetalol (NORMODYNE) 200 MG tablet Take 1 tablet (200 mg total) by mouth 2 (two) times daily. 12/21/15 06/10/16  Orson Eva, MD  ?labetalol (NORMODYNE) 300 MG tablet Take 300 mg by mouth 2 (two) times daily.    [provider]  ?Linaclotide Rolan Lipa) 290 MCG CAPS capsule Take 290 mcg by mouth daily.    [provider]  ?meloxicam (MOBIC) 15 MG tablet Take 15 mg by mouth daily.    [provider]  ?omega-3 acid ethyl esters (LOVAZA) 1 G capsule Take 1 g by mouth 2 (two) times daily.    [provider]  ?omeprazole (PRILOSEC) 40 MG capsule Take 40 mg by mouth  daily.    [provider]  ?potassium chloride 20 MEQ TBCR Take 20 mEq by mouth daily for 5 days. [id number]  Lianne Cure, DO  ?promethazine (PHENERGAN) 25 MG tablet Take 1 tablet (25 mg total) by mouth every 6 (six) hours as needed for nausea or vomiting. 08/18/16   Jeannett Senior, PA-C  ?   ? ?Allergies    ?Augmentin [amoxicillin-pot clavulanate], Codeine, Meloxicam, and Tetracyclines & related   ? ?Review of Systems   ?Review of Systems  ?Constitutional:  Negative for fever.  ?Skin:   ?     Tender area in the periumbilical area on the left side  ? ?Physical Exam ?Updated Vital Signs ?BP (!) 129/101 (BP Location: Right Arm)   Pulse  (!) 57   Temp 98.3 ?F (36.8 ?C) (Oral)   Resp 20   Ht 5' 6.5" (1.689 m)   Wt 86.1 kg   SpO2 100%   BMI 30.18 kg/m?  ?Physical Exam ?Vitals and nursing note reviewed.  ?Constitutional:   ?   General: She is not in acute distress. ?   Appearance: She is well-developed.  ?HENT:  ?   Head: Normocephalic and atraumatic.  ?Eyes:  ?   Conjunctiva/sclera: Conjunctivae normal.  ?Cardiovascular:  ?   Rate and Rhythm: Normal rate.  ?Pulmonary:  ?   Effort: Pulmonary effort is normal. No respiratory distress.  ?Abdominal:  ?   Palpations: Abdomen is soft.  ?   Tenderness: There is no abdominal tenderness.  ?Musculoskeletal:     ?   General: No swelling.  ?   Cervical back: Neck supple.  ?Skin: ?   General: Skin is warm and dry.  ?   Capillary Refill: Capillary refill takes less than 2 seconds.  ?   Findings: Bruising present.  ?   Comments: Minimal bruising noted at site of Lovenox injection  ?Neurological:  ?   Mental Status: She is alert.  ?Psychiatric:     ?   Mood and Affect: Mood normal.  ? ? ?ED Results / Procedures / Treatments   ?Labs ?(all labs ordered are listed, but only abnormal results are displayed) ?Labs Reviewed - No data to display ? ?EKG ?None ? ?Radiology ?No results found. ? ?Procedures ?Procedures  ? ? ?Medications Ordered in ED ?Medications - No data to display ? ?ED Course/ Medical Decision Making/ A&P ?  ?                        ?Medical Decision Making ? ?The patient presents with concerns about a sore area near the site of recent Lovenox injections.  After examination of the area, it appears that the patient may have a small hematoma just under the skin.  I see nothing that looks out of the ordinary for the site of Lovenox injections.  Minimal bruising in the area.  The patient may discharge home.  This should improve with time.  I explained this to the patient and she voiced understanding. ? ?Final Clinical Impression(s) / ED Diagnoses ?Final diagnoses:  ?Hematoma  ? ? ?Rx / DC Orders ?ED  Discharge Orders   ? ? None  ? ?  ? ? ?  ?Dorothyann Peng, PA-C ?03/18/22 1626 ? ?  ?Davonna Belling, MD ?03/18/22 2334 ? ?

## 2022-03-18 NOTE — ED Triage Notes (Signed)
Pt reports lumb in abdomen where heparin injection was placed in hospital ?D/C yesterday  ?States tenderness  ? ?Provider at bedside  ?

## 2022-03-31 ENCOUNTER — Other Ambulatory Visit: Payer: Self-pay

## 2022-03-31 ENCOUNTER — Emergency Department (HOSPITAL_BASED_OUTPATIENT_CLINIC_OR_DEPARTMENT_OTHER)
Admission: EM | Admit: 2022-03-31 | Discharge: 2022-03-31 | Disposition: A | Discharge status: Home / Self Care | Payer: Medicare Other | Attending: Emergency Medicine | Admitting: Emergency Medicine

## 2022-03-31 ENCOUNTER — Emergency Department (HOSPITAL_BASED_OUTPATIENT_CLINIC_OR_DEPARTMENT_OTHER): Payer: Medicare Other

## 2022-03-31 ENCOUNTER — Encounter (HOSPITAL_BASED_OUTPATIENT_CLINIC_OR_DEPARTMENT_OTHER): Payer: Self-pay | Admitting: Emergency Medicine

## 2022-03-31 DIAGNOSIS — R600 Localized edema: Secondary | ICD-10-CM | POA: Diagnosis not present

## 2022-03-31 DIAGNOSIS — Z79899 Other long term (current) drug therapy: Secondary | ICD-10-CM | POA: Diagnosis not present

## 2022-03-31 DIAGNOSIS — E876 Hypokalemia: Secondary | ICD-10-CM | POA: Insufficient documentation

## 2022-03-31 DIAGNOSIS — I509 Heart failure, unspecified: Secondary | ICD-10-CM | POA: Diagnosis not present

## 2022-03-31 DIAGNOSIS — Z7982 Long term (current) use of aspirin: Secondary | ICD-10-CM | POA: Insufficient documentation

## 2022-03-31 DIAGNOSIS — M7989 Other specified soft tissue disorders: Secondary | ICD-10-CM | POA: Diagnosis present

## 2022-03-31 DIAGNOSIS — N189 Chronic kidney disease, unspecified: Secondary | ICD-10-CM | POA: Diagnosis not present

## 2022-03-31 DIAGNOSIS — R609 Edema, unspecified: Secondary | ICD-10-CM

## 2022-03-31 LAB — CBC WITH DIFFERENTIAL/PLATELET
Abs Immature Granulocytes: 0.01 10*3/uL (ref 0.00–0.07)
Basophils Absolute: 0.1 10*3/uL (ref 0.0–0.1)
Basophils Relative: 2 %
Eosinophils Absolute: 0.1 10*3/uL (ref 0.0–0.5)
Eosinophils Relative: 3 %
HCT: 36.2 % (ref 36.0–46.0)
Hemoglobin: 12.1 g/dL (ref 12.0–15.0)
Immature Granulocytes: 0 %
Lymphocytes Relative: 40 %
Lymphs Abs: 1.5 10*3/uL (ref 0.7–4.0)
MCH: 28.1 pg (ref 26.0–34.0)
MCHC: 33.4 g/dL (ref 30.0–36.0)
MCV: 84.2 fL (ref 80.0–100.0)
Monocytes Absolute: 0.3 10*3/uL (ref 0.1–1.0)
Monocytes Relative: 9 %
Neutro Abs: 1.7 10*3/uL (ref 1.7–7.7)
Neutrophils Relative %: 46 %
Platelets: 270 10*3/uL (ref 150–400)
RBC: 4.3 MIL/uL (ref 3.87–5.11)
RDW: 14.6 % (ref 11.5–15.5)
WBC: 3.6 10*3/uL — ABNORMAL LOW (ref 4.0–10.5)
nRBC: 0 % (ref 0.0–0.2)

## 2022-03-31 LAB — COMPREHENSIVE METABOLIC PANEL
ALT: 31 U/L (ref 0–44)
AST: 32 U/L (ref 15–41)
Albumin: 4 g/dL (ref 3.5–5.0)
Alkaline Phosphatase: 116 U/L (ref 38–126)
Anion gap: 9 (ref 5–15)
BUN: 13 mg/dL (ref 8–23)
CO2: 27 mmol/L (ref 22–32)
Calcium: 9.7 mg/dL (ref 8.9–10.3)
Chloride: 97 mmol/L — ABNORMAL LOW (ref 98–111)
Creatinine, Ser: 1.19 mg/dL — ABNORMAL HIGH (ref 0.44–1.00)
GFR, Estimated: 49 mL/min — ABNORMAL LOW (ref >60)
Glucose, Bld: 84 mg/dL (ref 70–99)
Potassium: 3.3 mmol/L — ABNORMAL LOW (ref 3.5–5.1)
Sodium: 133 mmol/L — ABNORMAL LOW (ref 135–145)
Total Bilirubin: 0.8 mg/dL (ref 0.3–1.2)
Total Protein: 7.9 g/dL (ref 6.5–8.1)

## 2022-03-31 LAB — TROPONIN I (HIGH SENSITIVITY): Troponin I (High Sensitivity): 9 ng/L (ref <18)

## 2022-03-31 LAB — BRAIN NATRIURETIC PEPTIDE: B Natriuretic Peptide: 26.4 pg/mL (ref 0.0–100.0)

## 2022-03-31 MED ORDER — POTASSIUM CHLORIDE CRYS ER 20 MEQ PO TBCR: 40.0000 meq | EXTENDED_RELEASE_TABLET | Freq: Every day | ORAL | 0 refills | Status: AC

## 2022-03-31 MED ORDER — FUROSEMIDE 20 MG PO TABS
20.0000 mg | ORAL_TABLET | Freq: Once | ORAL | Status: AC
Start: 2022-03-31 — End: 2022-03-31
  Administered 2022-03-31: 20 mg via ORAL
  Filled 2022-03-31: qty 1

## 2022-03-31 MED ORDER — POTASSIUM CHLORIDE CRYS ER 20 MEQ PO TBCR
40.0000 meq | EXTENDED_RELEASE_TABLET | Freq: Once | ORAL | Status: AC
  Administered 2022-03-31: 40 meq via ORAL
  Filled 2022-03-31: qty 2

## 2022-03-31 NOTE — ED Triage Notes (Signed)
Patient arrived via POV c/o leg swelling x 3 days. Patient states worse during day, better in evening. Patient endorses hx of CHF. Patient states 9/10 pain. Patient is AO x 4, VS with elevated BP, slow gait. ?

## 2022-03-31 NOTE — Discharge Instructions (Signed)
For the next week I would recommend increasing her Lasix regimen to 40 mg (2 tablets) in the morning and 20 mg (1 tablet) in the afternoon.  While doing this I would also recommend increasing your potassium from 20 mill equivalents daily to 40 mill equivalents daily. ? ?Please follow-up with your regular doctor, nephrologist, cardiologist. ? ?Ideally you should have your blood work including your kidney function and electrolytes rechecked in a few days.  Your regular doctors should be able to make this arrangement. ? ?If you are having increasing leg swelling despite this intervention, any difficulty in breathing, chest pain or other new concerning symptom, come back to ER for reassessment. ?

## 2022-03-31 NOTE — ED Notes (Signed)
ED Provider at bedside. 

## 2022-03-31 NOTE — ED Provider Notes (Signed)
?[facility] EM[facility]ENCY DEPARTMENT ?Provider Note ? ? ?CSN: [id number] ?Arrival date & time: 03/31/22  1925 ? ?  ? ?History ? ?Chief Complaint  ?Patient presents with  ? Leg Swelling  ? ? ?Christina Vang is a 71 y.o. female.  Presented to the emergency department due to concern for leg swelling.  Patient reports that she has a history of CHF, hypoaldosteronism, CKD.  Patient reports over the last few days she is noted some increased swelling in her lower leg, ankles.  Equal in both legs.  No redness, no pain at rest.  States that she is currently taking Lasix 20 mg twice daily and potassium 20 mill equivalents daily.  Reviewed care everywhere, noted recent note from [facility] endocrinology.  Patient has history of primary hyperaldosteronism S/p L adrenalectomy (2017) ? ?Patient denies any sort of chest pain or difficulty in breathing.  Besides leg swelling otherwise has been feeling okay. ? ?HPI ? ?  ? ?Home Medications ?Prior to Admission medications   ?Medication Sig Start Date End Date Taking? Authorizing Provider  ?potassium chloride SA (KLOR-CON M) 20 MEQ tablet Take 2 tablets (40 mEq total) by mouth daily. 03/31/22  Yes Milagros Loll, MD  ?albuterol (PROVENTIL HFA;VENTOLIN HFA) 108 (90 BASE) MCG/ACT inhaler Inhale 2 puffs into the lungs 2 (two) times daily as needed for wheezing or shortness of breath.    [provider]  ?amLODipine (NORVASC) 5 MG tablet Take 1 tablet (5 mg total) by mouth daily. ?Patient taking differently: Take 5 mg by mouth 2 (two) times daily.  12/21/15   Catarina Hartshorn, MD  ?aspirin EC 81 MG tablet Take 81 mg by mouth daily.    [provider]  ?atorvastatin (LIPITOR) 20 MG tablet Take 20 mg by mouth at bedtime.     [provider]  ?cetirizine (ZYRTEC) 10 MG tablet Take 10 mg by mouth daily.    [provider]  ?cholecalciferol (VITAMIN D) 1000 units tablet Take 1,000 Units by mouth daily.    [provider]  ?cyclobenzaprine  (FLEXERIL) 10 MG tablet Take 10 mg by mouth 3 (three) times daily as needed for muscle spasms.    [provider]  ?diclofenac sodium (VOLTAREN) 1 % GEL Apply 1 application topically 2 (two) times daily as needed (pain).    [provider]  ?dicyclomine (BENTYL) 20 MG tablet Take 1 tablet (20 mg total) by mouth 3 (three) times daily as needed for spasms. 03/03/21   Long, Arlyss Repress, MD  ?DULoxetine (CYMBALTA) 60 MG capsule Take 60 mg by mouth daily as needed (pain).     [provider]  ?eplerenone (INSPRA) 50 MG tablet Take 100 mg by mouth 2 (two) times daily. 09/25/15   [provider]  ?eszopiclone (LUNESTA) 2 MG TABS tablet Take 2 mg by mouth at bedtime as needed for sleep. Take immediately before bedtime    [provider]  ?gabapentin (NEURONTIN) 100 MG capsule Take 600 mg by mouth at bedtime.  11/05/15   [provider]  ?HYDROcodone-acetaminophen (NORCO) 5-325 MG tablet Take 1 tablet by mouth every 6 (six) hours as needed for moderate pain. 08/18/16   Kirichenko, Lemont Fillers, PA-C  ?labetalol (NORMODYNE) 200 MG tablet Take 1 tablet (200 mg total) by mouth 2 (two) times daily. 12/21/15 06/10/16  Catarina Hartshorn, MD  ?labetalol (NORMODYNE) 300 MG tablet Take 300 mg by mouth 2 (two) times daily.    [provider]  ?Linaclotide Karlene Einstein) 290 MCG CAPS  capsule Take 290 mcg by mouth daily.    [provider]  ?meloxicam (MOBIC) 15 MG tablet Take 15 mg by mouth daily.    [provider]  ?omega-3 acid ethyl esters (LOVAZA) 1 G capsule Take 1 g by mouth 2 (two) times daily.    [provider]  ?omeprazole (PRILOSEC) 40 MG capsule Take 40 mg by mouth daily.    [provider]  ?potassium chloride 20 MEQ TBCR Take 20 mEq by mouth daily for 5 days. 03/14/22 03/19/22  Franne FortsGray, Alicia P, DO  ?promethazine (PHENERGAN) 25 MG tablet Take 1 tablet (25 mg total) by mouth every 6 (six) hours as needed for nausea or vomiting. 08/18/16   Jaynie CrumbleKirichenko,  Tatyana, PA-C  ?   ? ?Allergies    ?Augmentin [amoxicillin-pot clavulanate], Codeine, Meloxicam, and Tetracyclines & related   ? ?Review of Systems   ?Review of Systems  ?Constitutional:  Negative for chills and fever.  ?HENT:  Negative for ear pain and sore throat.   ?Eyes:  Negative for pain and visual disturbance.  ?Respiratory:  Negative for cough and shortness of breath.   ?Cardiovascular:  Negative for chest pain and palpitations.  ?Gastrointestinal:  Negative for abdominal pain and vomiting.  ?Genitourinary:  Negative for dysuria and hematuria.  ?Musculoskeletal:  Negative for arthralgias and back pain.  ?     Leg swelling  ?Skin:  Negative for color change and rash.  ?Neurological:  Negative for seizures and syncope.  ?All other systems reviewed and are negative. ? ?Physical Exam ?Updated Vital Signs ?BP (!) 147/80   Pulse 83   Temp 97.7 ?F (36.5 ?C) (Oral)   Resp 11   Ht 5\' 6"  (1.676 m)   Wt 86.2 kg   SpO2 97%   BMI 30.67 kg/m?  ?Physical Exam ?Vitals and nursing note reviewed.  ?Constitutional:   ?   General: She is not in acute distress. ?   Appearance: She is well-developed.  ?HENT:  ?   Head: Normocephalic and atraumatic.  ?Eyes:  ?   Conjunctiva/sclera: Conjunctivae normal.  ?Cardiovascular:  ?   Rate and Rhythm: Normal rate and regular rhythm.  ?   Heart sounds: No murmur heard. ?Pulmonary:  ?   Effort: Pulmonary effort is normal. No respiratory distress.  ?   Breath sounds: Normal breath sounds.  ?Abdominal:  ?   Palpations: Abdomen is soft.  ?   Tenderness: There is no abdominal tenderness.  ?Musculoskeletal:     ?   General: Swelling present.  ?   Cervical back: Neck supple.  ?   Comments: Patient has very mild 1+ edema to her very distal lower legs, ankle, feet, equal bilaterally, normal DP and PT pulses, sensation and motor  ?Skin: ?   General: Skin is warm and dry.  ?   Capillary Refill: Capillary refill takes less than 2 seconds.  ?Neurological:  ?   General: No focal deficit present.  ?    Mental Status: She is alert.  ?Psychiatric:     ?   Mood and Affect: Mood normal.  ? ? ?ED Results / Procedures / Treatments   ?Labs ?(all labs ordered are listed, but only abnormal results are displayed) ?Labs Reviewed  ?CBC WITH DIFFERENTIAL/PLATELET - Abnormal; Notable for the following components:  ?    Result Value  ? WBC 3.6 (*)   ? All other components within normal limits  ?COMPREHENSIVE METABOLIC PANEL - Abnormal; Notable for the following components:  ? Sodium  133 (*)   ? Potassium 3.3 (*)   ? Chloride 97 (*)   ? Creatinine, Ser 1.19 (*)   ? GFR, Estimated 49 (*)   ? All other components within normal limits  ?BRAIN NATRIURETIC PEPTIDE  ?TROPONIN I (HIGH SENSITIVITY)  ?TROPONIN I (HIGH SENSITIVITY)  ? ? ?EKG ?EKG Interpretation ? ?Date/Time:  Monday Mar 31 2022 21:12:41 EDT ?Ventricular Rate:  80 ?PR Interval:  201 ?QRS Duration: 93 ?QT Interval:  384 ?QTC Calculation: 443 ?R Axis:   22 ?Text Interpretation: Sinus rhythm Abnormal R-wave progression, early transition LVH by voltage Confirmed by Marianna Fuss ([id number]) on 03/31/2022 10:05:20 PM ? ?Radiology ?DG Chest 2 View ? ?Result Date: 03/31/2022 ?CLINICAL DATA:  Short of breath, lower extremity swelling for 3 days EXAM: CHEST - 2 VIEW COMPARISON:  08/14/2021 FINDINGS: Frontal and lateral views of the chest demonstrates stable dual lead pacer. Cardiac silhouette is unremarkable. No airspace disease, effusion, or pneumothorax. No acute bony abnormalities. IMPRESSION: 1. No acute intrathoracic process. Electronically Signed   By: Sharlet Salina M.D.   On: 03/31/2022 21:06   ? ?Procedures ?Procedures  ? ? ?Medications Ordered in ED ?Medications  ?potassium chloride SA (KLOR-CON M) CR tablet 40 mEq (40 mEq Oral Given 03/31/22 2244)  ?furosemide (LASIX) tablet 20 mg (20 mg Oral Given 03/31/22 2247)  ? ? ?ED Course/ Medical Decision Making/ A&P ?  ?                        ?Medical Decision Making ?Amount and/or Complexity of Data Reviewed ?Labs:  ordered. ?Radiology: ordered. ? ?Risk ?Prescription drug management. ? ? ?70 year old lady presenting to ER due to concern for bilateral lower lower leg swelling, ankle swelling and foot swelling.  Have some mild swelling on exam.

## 2022-03-31 NOTE — ED Notes (Signed)
Patient transported to X-ray 

## 2023-07-19 IMAGING — CR DG CHEST 2V
2 series · 2 of 2 positions shown · non-contrast
Comparison: 08/14/2021

CLINICAL DATA: Short of breath, lower extremity swelling for 3 days

EXAM:
CHEST - 2 VIEW

[w chest pa]
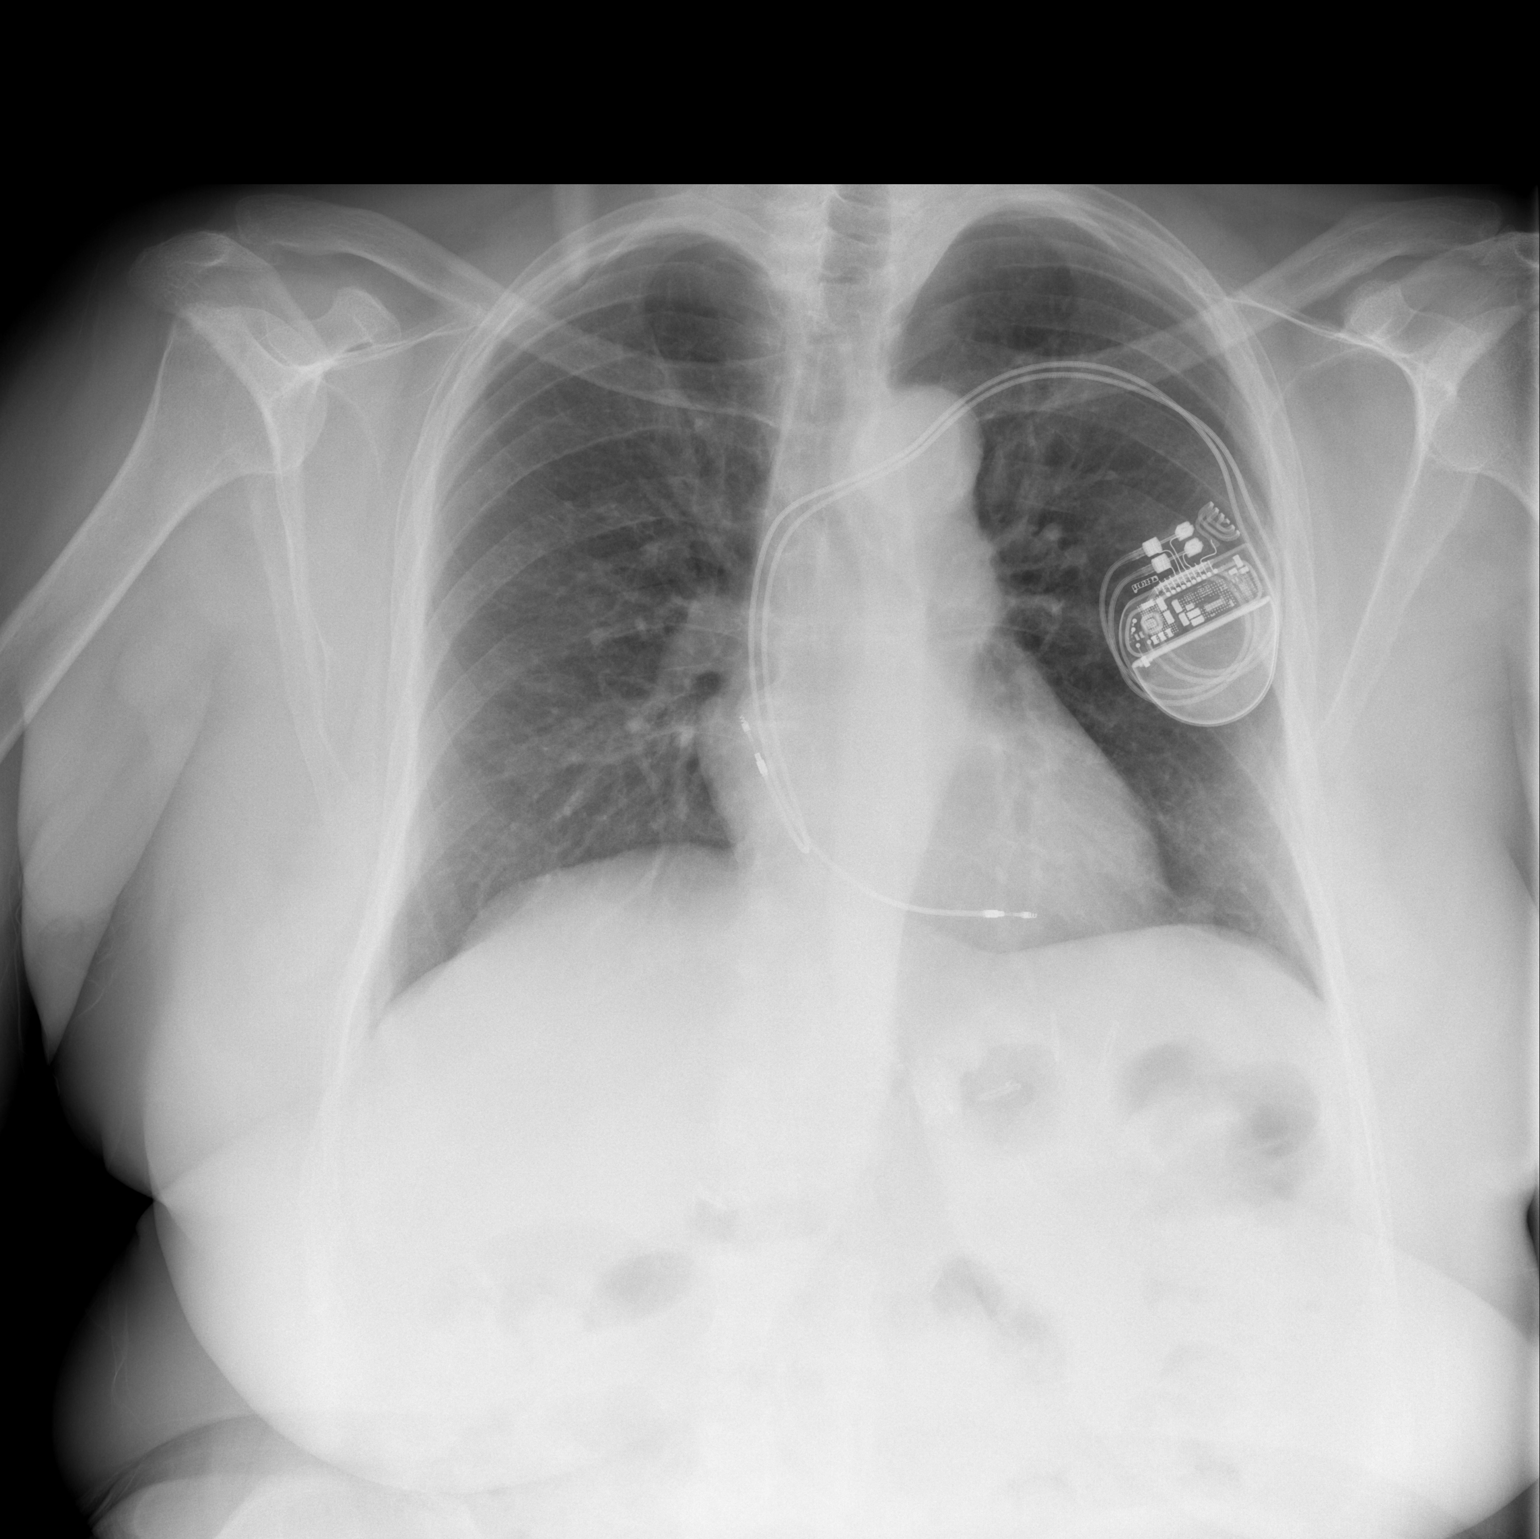

[w chest lat]
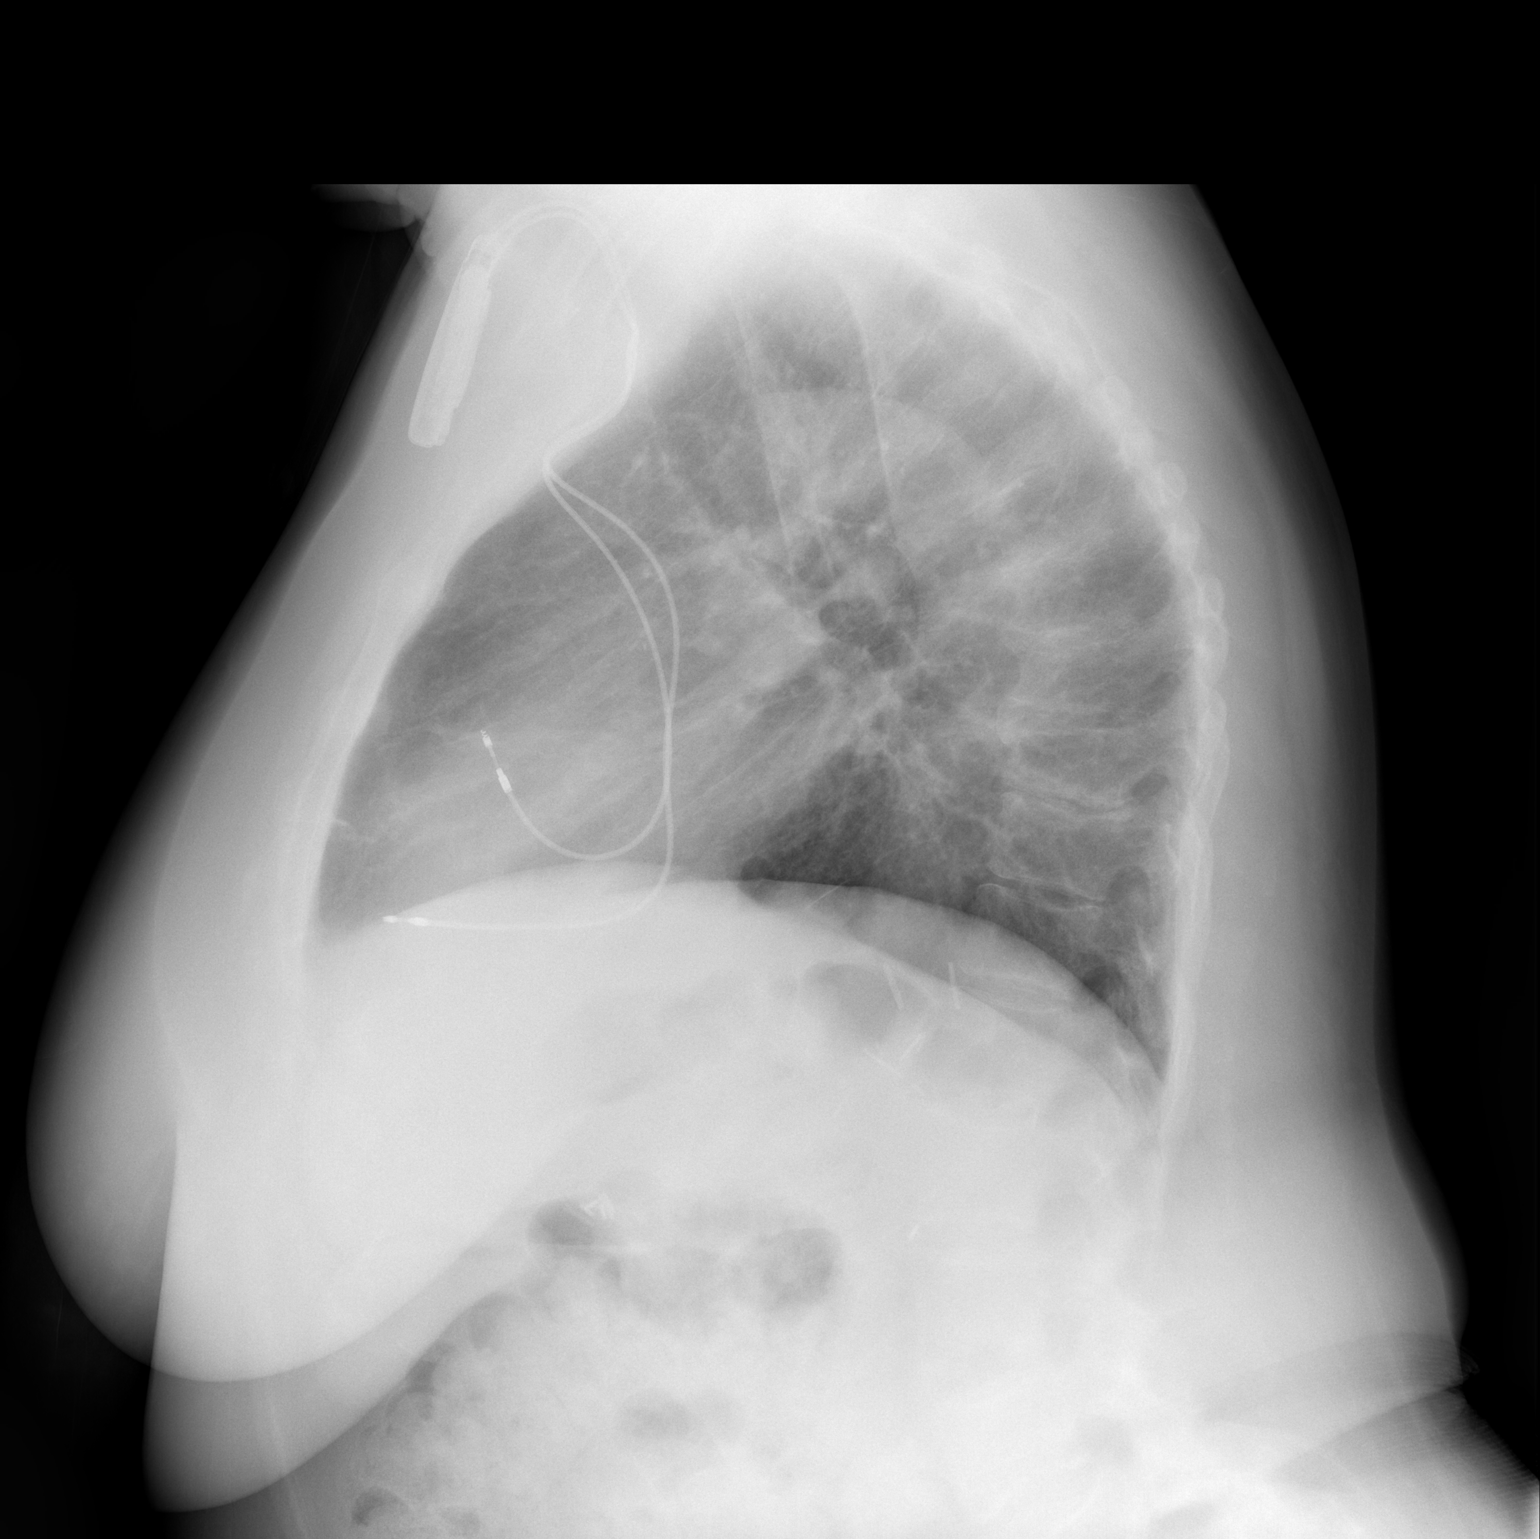

[2 of 2 positions shown; findings below may reference images not displayed]

FINDINGS: Frontal and lateral views of the chest demonstrates stable dual lead
pacer. Cardiac silhouette is unremarkable. No airspace disease,
effusion, or pneumothorax. No acute bony abnormalities.
IMPRESSION: 1. No acute intrathoracic process.
